# Patient Record
Sex: Female | Born: 1935 | Race: White | Hispanic: No | Smoking: Former smoker
Health system: Southern US, Community
[De-identification: ages and names within clinical notes are randomized; demographics above are authoritative.]

## PROBLEM LIST (undated history)

## (undated) DIAGNOSIS — J439 Emphysema, unspecified: Secondary | ICD-10-CM

## (undated) DIAGNOSIS — F0280 Dementia in other diseases classified elsewhere without behavioral disturbance: Secondary | ICD-10-CM

## (undated) DIAGNOSIS — R430 Anosmia: Secondary | ICD-10-CM

## (undated) DIAGNOSIS — B029 Zoster without complications: Secondary | ICD-10-CM

## (undated) DIAGNOSIS — I10 Essential (primary) hypertension: Secondary | ICD-10-CM

## (undated) DIAGNOSIS — N390 Urinary tract infection, site not specified: Secondary | ICD-10-CM

## (undated) DIAGNOSIS — J45909 Unspecified asthma, uncomplicated: Secondary | ICD-10-CM

## (undated) DIAGNOSIS — T7840XA Allergy, unspecified, initial encounter: Secondary | ICD-10-CM

## (undated) DIAGNOSIS — R55 Syncope and collapse: Secondary | ICD-10-CM

## (undated) DIAGNOSIS — H9190 Unspecified hearing loss, unspecified ear: Secondary | ICD-10-CM

## (undated) DIAGNOSIS — B019 Varicella without complication: Secondary | ICD-10-CM

## (undated) DIAGNOSIS — J339 Nasal polyp, unspecified: Secondary | ICD-10-CM

## (undated) DIAGNOSIS — G309 Alzheimer's disease, unspecified: Secondary | ICD-10-CM

## (undated) DIAGNOSIS — J449 Chronic obstructive pulmonary disease, unspecified: Secondary | ICD-10-CM

## (undated) HISTORY — DX: Nasal polyp, unspecified: J33.9

## (undated) HISTORY — DX: Unspecified hearing loss, unspecified ear: H91.90

## (undated) HISTORY — DX: Emphysema, unspecified: J43.9

## (undated) HISTORY — DX: Urinary tract infection, site not specified: N39.0

## (undated) HISTORY — DX: Essential (primary) hypertension: I10

## (undated) HISTORY — DX: Allergy, unspecified, initial encounter: T78.40XA

## (undated) HISTORY — DX: Anosmia: R43.0

## (undated) HISTORY — PX: OTHER SURGICAL HISTORY: SHX169

## (undated) HISTORY — PX: BREAST BIOPSY: SHX20

## (undated) HISTORY — PX: ABDOMINAL HYSTERECTOMY: SHX81

## (undated) HISTORY — DX: Varicella without complication: B01.9

## (undated) HISTORY — DX: Syncope and collapse: R55

## (undated) HISTORY — DX: Zoster without complications: B02.9

## (undated) HISTORY — DX: Unspecified asthma, uncomplicated: J45.909

---

## 2016-07-18 ENCOUNTER — Emergency Department: Payer: Medicare Other

## 2016-07-18 ENCOUNTER — Emergency Department
Admission: EM | Admit: 2016-07-18 | Discharge: 2016-07-19 | Disposition: A | Discharge status: Home / Self Care | Payer: Medicare Other | Attending: Emergency Medicine | Admitting: Emergency Medicine

## 2016-07-18 ENCOUNTER — Encounter: Payer: Self-pay | Admitting: Emergency Medicine

## 2016-07-18 DIAGNOSIS — L0390 Cellulitis, unspecified: Secondary | ICD-10-CM

## 2016-07-18 DIAGNOSIS — I8001 Phlebitis and thrombophlebitis of superficial vessels of right lower extremity: Secondary | ICD-10-CM | POA: Insufficient documentation

## 2016-07-18 DIAGNOSIS — Z87891 Personal history of nicotine dependence: Secondary | ICD-10-CM | POA: Diagnosis not present

## 2016-07-18 DIAGNOSIS — I809 Phlebitis and thrombophlebitis of unspecified site: Secondary | ICD-10-CM

## 2016-07-18 DIAGNOSIS — L03115 Cellulitis of right lower limb: Secondary | ICD-10-CM | POA: Insufficient documentation

## 2016-07-18 DIAGNOSIS — Z79899 Other long term (current) drug therapy: Secondary | ICD-10-CM | POA: Insufficient documentation

## 2016-07-18 DIAGNOSIS — M79604 Pain in right leg: Secondary | ICD-10-CM

## 2016-07-18 DIAGNOSIS — I803 Phlebitis and thrombophlebitis of lower extremities, unspecified: Secondary | ICD-10-CM | POA: Diagnosis present

## 2016-07-18 DIAGNOSIS — J449 Chronic obstructive pulmonary disease, unspecified: Secondary | ICD-10-CM | POA: Diagnosis not present

## 2016-07-18 DIAGNOSIS — G309 Alzheimer's disease, unspecified: Secondary | ICD-10-CM | POA: Insufficient documentation

## 2016-07-18 HISTORY — DX: Dementia in other diseases classified elsewhere, unspecified severity, without behavioral disturbance, psychotic disturbance, mood disturbance, and anxiety: F02.80

## 2016-07-18 HISTORY — DX: Chronic obstructive pulmonary disease, unspecified: J44.9

## 2016-07-18 HISTORY — DX: Alzheimer's disease, unspecified: G30.9

## 2016-07-18 LAB — CBC WITH DIFFERENTIAL/PLATELET
BASOS ABS: 0.1 10*3/uL (ref 0–0.1)
Basophils Relative: 1 %
EOS ABS: 0.5 10*3/uL (ref 0–0.7)
EOS PCT: 6 %
HCT: 37.3 % (ref 35.0–47.0)
Hemoglobin: 12.9 g/dL (ref 12.0–16.0)
LYMPHS PCT: 25 %
Lymphs Abs: 2.1 10*3/uL (ref 1.0–3.6)
MCH: 32.1 pg (ref 26.0–34.0)
MCHC: 34.5 g/dL (ref 32.0–36.0)
MCV: 93 fL (ref 80.0–100.0)
MONO ABS: 0.7 10*3/uL (ref 0.2–0.9)
Monocytes Relative: 8 %
Neutro Abs: 5 10*3/uL (ref 1.4–6.5)
Neutrophils Relative %: 60 %
PLATELETS: 212 10*3/uL (ref 150–440)
RBC: 4 MIL/uL (ref 3.80–5.20)
RDW: 13.8 % (ref 11.5–14.5)
WBC: 8.3 10*3/uL (ref 3.6–11.0)

## 2016-07-18 LAB — CBC
HCT: 37.3 % (ref 35.0–47.0)
Hemoglobin: 12.9 g/dL (ref 12.0–16.0)
MCH: 32.4 pg (ref 26.0–34.0)
MCHC: 34.7 g/dL (ref 32.0–36.0)
MCV: 93.4 fL (ref 80.0–100.0)
PLATELETS: 207 10*3/uL (ref 150–440)
RBC: 3.99 MIL/uL (ref 3.80–5.20)
RDW: 13.8 % (ref 11.5–14.5)
WBC: 8.3 10*3/uL (ref 3.6–11.0)

## 2016-07-18 LAB — BASIC METABOLIC PANEL
Anion gap: 5 (ref 5–15)
BUN: 32 mg/dL — AB (ref 6–20)
CALCIUM: 9.2 mg/dL (ref 8.9–10.3)
CO2: 28 mmol/L (ref 22–32)
CREATININE: 1.16 mg/dL — AB (ref 0.44–1.00)
Chloride: 107 mmol/L (ref 101–111)
GFR calc Af Amer: 46 mL/min — ABNORMAL LOW (ref >60)
GFR, EST NON AFRICAN AMERICAN: 40 mL/min — AB (ref >60)
GLUCOSE: 76 mg/dL (ref 65–99)
POTASSIUM: 4.1 mmol/L (ref 3.5–5.1)
SODIUM: 140 mmol/L (ref 135–145)

## 2016-07-18 NOTE — ED Triage Notes (Addendum)
Pt ambulatory to triage with c/o RIGHT leg pain, pt daughter reports mother mentioned right leg pain today. Redness noted to right calf, tender and warm to touch. Pt denies chest pain, abdominal pain, or shortness of breath. Pt has hx of alzheimer's.

## 2016-07-18 NOTE — ED Provider Notes (Addendum)
[facility] Emergency Department Provider Note   ____________________________________________   First MD Initiated Contact with Patient 07/18/16 2134     (approximate)  I have reviewed the triage vital signs and the nursing notes.   HISTORY  Chief Complaint Cellulitis   HPI Makayla [name] is a 82 y.o. female who came up from [place] in the [place] area to avoid [name] who now complains of pain in the right upper mid medial calf and going up above the knee slightly. The area is red there is a cord palpable.   Past Medical History:  Diagnosis Date  . Alzheimer's dementia   . COPD (chronic obstructive pulmonary disease) (HCC)     There are no active problems to display for this patient.   Past Surgical History:  Procedure Laterality Date  . ABDOMINAL HYSTERECTOMY    . nasal polyps removed      Prior to Admission medications   Medication Sig Start Date End Date Taking? Authorizing Provider  cetirizine (ZYRTEC) 10 MG tablet Take 10 mg by mouth daily.   Yes Historical Provider, MD  donepezil (ARICEPT) 10 MG tablet Take 10 mg by mouth at bedtime.   Yes Historical Provider, MD  Fluticasone-Salmeterol (ADVAIR) 250-50 MCG/DOSE AEPB Inhale 1 puff into the lungs 2 (two) times daily.   Yes Historical Provider, MD  losartan-hydrochlorothiazide (HYZAAR) 50-12.5 MG tablet Take 1 tablet by mouth daily.   Yes Historical Provider, MD  memantine (NAMENDA) 10 MG tablet Take 10 mg by mouth 2 (two) times daily.   Yes Historical Provider, MD  Multiple Vitamin (MULTIVITAMIN WITH MINERALS) TABS tablet Take 1 tablet by mouth daily.   Yes Historical Provider, MD    Allergies Review of patient's allergies indicates no known allergies.  No family history on file.  Social History Social History  Substance Use Topics  . Smoking status: Former Games developermoker  . Smokeless tobacco: Never Used  . Alcohol use No    Review of Systems Constitutional: No  fever/chills Eyes: No visual changes. ENT: No sore throat. Cardiovascular: Denies chest pain. Respiratory: Denies shortness of breath. Gastrointestinal: No abdominal pain.  No nausea, no vomiting.  No diarrhea.  No constipation. Genitourinary: Negative for dysuria. Musculoskeletal: Negative for back pain. Skin: Negative for rash. Neurological: Negative for headaches, focal weakness or  10-point ROS otherwise negative.  ____________________________________________   PHYSICAL EXAM:  VITAL SIGNS: ED Triage Vitals [07/18/16 2035]  Enc Vitals Group     BP (!) 152/67     Pulse Rate 62     Resp 18     Temp 98 F (36.7 C)     Temp Source Oral     SpO2 95 %     Weight 150 lb (68 kg)     Height 5\' 3"  (1.6 m)     Head Circumference      Peak Flow      Pain Score      Pain Loc      Pain Edu?      Excl. in GC?    Constitutional: Alert and oriented. Well appearing and in no acute distress. Eyes: Conjunctivae are normal. PERRL. EOMI. Head: Atraumatic. Nose: No congestion/rhinnorhea. Mouth/Throat: Mucous membranes are moist.  Oropharynx non-erythematous. Neck: No stridor. Cardiovascular: Normal rate, regular rhythm. Grossly normal heart sounds.  Good peripheral circulation. Respiratory: Normal respiratory effort.  No retractions. Lungs CTAB. Gastrointestinal: Soft and nontender. No distention. No abdominal bruits. No CVA tenderness. Musculoskeletal: Area of erythema approximately 2 inches  wide and approximately 8 inches long from below the right knee to just above it. There is a cord palpable there. Neurologic:  Normal speech and language. No gross focal neurologic deficits are appreciated. No gait instability. Skin:  Skin is warm, dry and intact. No rash noted. Psychiatric: Mood and affect are normal. Speech and behavior are normal.  ____________________________________________   LABS (all labs ordered are listed, but only abnormal results are displayed)  Labs Reviewed  BASIC  METABOLIC PANEL - Abnormal; Notable for the following:       Result Value   BUN 32 (*)    Creatinine, Ser 1.16 (*)    GFR calc non Af Amer 40 (*)    GFR calc Af Amer 46 (*)    All other components within normal limits  CBC  CBC WITH DIFFERENTIAL/PLATELET   ____________________________________________  EKG  ____________________________________________  RADIOLOGY  EXAM: Right LOWER EXTREMITY VENOUS DOPPLER ULTRASOUND  TECHNIQUE: Gray-scale sonography with graded compression, as well as color Doppler and duplex ultrasound were performed to evaluate the lower extremity deep venous systems from the level of the common femoral vein and including the common femoral, femoral, profunda femoral, popliteal and calf veins including the posterior tibial, peroneal and gastrocnemius veins when visible. The superficial great saphenous vein was also interrogated. Spectral Doppler was utilized to evaluate flow at rest and with distal augmentation maneuvers in the common femoral, femoral and popliteal veins.  COMPARISON:  None.  FINDINGS: Contralateral Common Femoral Vein: Respiratory phasicity is normal and symmetric with the symptomatic side. No evidence of thrombus. Normal compressibility.  Common Femoral Vein: No evidence of thrombus. Normal compressibility, respiratory phasicity and response to augmentation.  Saphenofemoral Junction: No evidence of thrombus. Normal compressibility and flow on color Doppler imaging.  Profunda Femoral Vein: No evidence of thrombus. Normal compressibility and flow on color Doppler imaging.  Femoral Vein: No evidence of thrombus. Normal compressibility, respiratory phasicity and response to augmentation.  Popliteal Vein: No evidence of thrombus. Normal compressibility, respiratory phasicity and response to augmentation.  Calf Veins: There is thrombus within a superficial vein of the right posterior calf, corresponding to the area of pain  indicated by the patient.  Superficial Great Saphenous Vein: No evidence of thrombus. Normal compressibility and flow on color Doppler imaging.  Venous Reflux:  None.  Other Findings:  None.  IMPRESSION: 1. No evidence of deep venous thrombosis. 2. Superficial calf vein thrombosis, corresponding to the area of pain.   Electronically Signed   By: Ellery Plunk M.D.   On: 07/19/2016 00:08 ____________________________________________   PROCEDURES  Procedure(s) performed:  Procedures  Critical Care performed:  ____________________________________________   INITIAL IMPRESSION / ASSESSMENT AND PLAN / ED COURSE  Pertinent labs & imaging results that were available during my care of the patient were reviewed by me and considered in my medical decision making (see chart for details).  Patient is still waiting to get ultrasound. Anticipate probably admitting her. Dr. Manson Passey will check on the ultrasound  Clinical Course     ____________________________________________   FINAL CLINICAL IMPRESSION(S) / ED DIAGNOSES  Final diagnoses:  Pain of right lower extremity  Superficial thrombophlebitis  Cellulitis, unspecified cellulitis site, unspecified extremity site, unspecified laterality      NEW MEDICATIONS STARTED DURING THIS VISIT:  New Prescriptions   No medications on file     Note:  This document was prepared using Dragon voice recognition software and may include unintentional dictation errors.    Arnaldo Natal, MD 07/18/16 [phone]  Arnaldo Natal, MD 07/19/16 [phone]

## 2016-07-18 NOTE — ED Notes (Signed)
Called lab to add on CBC with diff to blood sent down earlier, they stated they would run the CBC with diff.

## 2016-07-18 NOTE — ED Notes (Signed)
Pt states R posterior calf pain that started to bother her around 6pm. Upper R posterior calf is red, appears swollen, states tender to touch. Does not feel much warmer than other leg at this time. Pt ambulated to treatment room. Denies hx of DVT. Denies itching, states it is sore.

## 2016-07-19 DIAGNOSIS — I803 Phlebitis and thrombophlebitis of lower extremities, unspecified: Secondary | ICD-10-CM | POA: Diagnosis present

## 2016-07-19 MED ORDER — CEPHALEXIN 500 MG PO CAPS: 500.0000 mg | ORAL_CAPSULE | Freq: Two times a day (BID) | ORAL | 0 refills | Status: AC

## 2016-07-19 MED ORDER — IBUPROFEN 800 MG PO TABS: 400.0000 mg | ORAL_TABLET | Freq: Three times a day (TID) | ORAL | 0 refills | Status: DC | PRN

## 2016-07-19 NOTE — Consult Note (Signed)
[facility] [place] [facility]   PATIENT NAME: Makayla Brownernna Jay    MR#:  [id number]  DATE OF BIRTH:  1923-07-07  DATE OF ADMISSION:  07/18/2016  PRIMARY CARE PHYSICIAN: No PCP Per Patient   REQUESTING/REFERRING PHYSICIAN: Darnelle CatalanMalinda, MD  CHIEF COMPLAINT:   Chief Complaint  Patient presents with  . Cellulitis    HISTORY OF PRESENT ILLNESS:  Makayla [name]  is a 82 y.o. female who presents with Right lower extremity erythema and tenderness. She states that she just noticed it in last 24 hours. She is here from [place] visiting her daughter. She denies any fever or chills, or other infectious symptoms. Here in the ED her workup was completely benign, including labs all within normal limits, except for her Doppler of her lower extremity which showed superficial calf vein clot. Hospitals were called for consultation for evaluation to see the patient required admission for observation.  PAST MEDICAL HISTORY:   Past Medical History:  Diagnosis Date  . Alzheimer's dementia   . COPD (chronic obstructive pulmonary disease) (HCC)     PAST SURGICAL HISTOIRY:   Past Surgical History:  Procedure Laterality Date  . ABDOMINAL HYSTERECTOMY    . nasal polyps removed      SOCIAL HISTORY:   Social History  Substance Use Topics  . Smoking status: Former Games developermoker  . Smokeless tobacco: Never Used  . Alcohol use No    FAMILY HISTORY:  No family history on file.  DRUG ALLERGIES:  No Known Allergies  REVIEW OF SYSTEMS:  Review of Systems  Constitutional: Negative for chills, fever, malaise/fatigue and weight loss.  HENT: Negative for ear pain, hearing loss and tinnitus.   Eyes: Negative for blurred vision, double vision, pain and redness.  Respiratory: Negative for cough, hemoptysis and shortness of breath.   Cardiovascular: Negative for chest pain, palpitations, orthopnea and leg swelling.  Gastrointestinal: Negative for abdominal pain, constipation,  diarrhea, nausea and vomiting.  Genitourinary: Negative for dysuria, frequency and hematuria.  Musculoskeletal: Negative for back pain, joint pain and neck pain.  Skin:       Linear erythema and tenderness right lower extremity  Neurological: Negative for dizziness, tremors, focal weakness and weakness.  Endo/Heme/Allergies: Negative for polydipsia. Does not bruise/bleed easily.  Psychiatric/Behavioral: Negative for depression. The patient is not nervous/anxious and does not have insomnia.     MEDICATIONS AT HOME:   Prior to Admission medications   Medication Sig Start Date End Date Taking? Authorizing Provider  cetirizine (ZYRTEC) 10 MG tablet Take 10 mg by mouth daily.   Yes Historical Provider, MD  donepezil (ARICEPT) 10 MG tablet Take 10 mg by mouth at bedtime.   Yes Historical Provider, MD  Fluticasone-Salmeterol (ADVAIR) 250-50 MCG/DOSE AEPB Inhale 1 puff into the lungs 2 (two) times daily.   Yes Historical Provider, MD  losartan-hydrochlorothiazide (HYZAAR) 50-12.5 MG tablet Take 1 tablet by mouth daily.   Yes Historical Provider, MD  memantine (NAMENDA) 10 MG tablet Take 10 mg by mouth 2 (two) times daily.   Yes Historical Provider, MD  Multiple Vitamin (MULTIVITAMIN WITH MINERALS) TABS tablet Take 1 tablet by mouth daily.   Yes Historical Provider, MD  cephALEXin (KEFLEX) 500 MG capsule Take 1 capsule (500 mg total) by mouth 2 (two) times daily. 07/19/16 07/29/16  Darci Currentandolph N Brown, MD  ibuprofen (ADVIL,MOTRIN) 800 MG tablet Take 0.5 tablets (400 mg total) by mouth every 8 (eight) hours as needed. 07/19/16   Darci Currentandolph N Brown, MD  VITAL SIGNS:   Vitals:   07/18/16 2035 07/18/16 2133 07/18/16 2234  BP: (!) 152/67 (!) 147/73 135/78  Pulse: 62 65 61  Resp: 18 18 16   Temp: 98 F (36.7 C)    TempSrc: Oral    SpO2: 95% 97% 95%  Weight: 68 kg (150 lb)    Height: 5\' 3"  (1.6 m)     Wt Readings from Last 3 Encounters:  07/18/16 68 kg (150 lb)    PHYSICAL EXAMINATION:   Physical Exam  Vitals reviewed. Constitutional: She is oriented to person, place, and time. She appears well-developed and well-nourished. No distress.  HENT:  Head: Normocephalic and atraumatic.  Mouth/Throat: Oropharynx is clear and moist.  Eyes: Conjunctivae and EOM are normal. Pupils are equal, round, and reactive to light. No scleral icterus.  Neck: Normal range of motion. Neck supple. No JVD present. No thyromegaly present.  Cardiovascular: Normal rate, regular rhythm and intact distal pulses.  Exam reveals no gallop and no friction rub.   No murmur heard. Respiratory: Effort normal and breath sounds normal. No respiratory distress. She has no wheezes. She has no rales.  GI: Soft. Bowel sounds are normal. She exhibits no distension. There is no tenderness.  Musculoskeletal: Normal range of motion. She exhibits no edema.  No arthritis, no gout  Lymphadenopathy:    She has no cervical adenopathy.  Neurological: She is alert and oriented to person, place, and time. No cranial nerve deficit.  No dysarthria, no aphasia  Skin: Skin is warm and dry. No rash noted. There is erythema (with tenderness and cording in a linear fashion extending from her proximal calf posteriorly to just above her knee joint).  Psychiatric: She has a normal mood and affect. Her behavior is normal. Judgment and thought content normal.     LABORATORY PANEL:   CBC  Recent Labs Lab 07/18/16 2048  WBC 8.3  8.3  HGB 12.9  12.9  HCT 37.3  37.3  PLT 212  207   ------------------------------------------------------------------------------------------------------------------  Chemistries   Recent Labs Lab 07/18/16 2048  NA 140  K 4.1  CL 107  CO2 28  GLUCOSE 76  BUN 32*  CREATININE 1.16*  CALCIUM 9.2   ------------------------------------------------------------------------------------------------------------------  Cardiac Enzymes No results for input(s): TROPONINI in the last 168  hours. ------------------------------------------------------------------------------------------------------------------  RADIOLOGY:  US Venous Img Lower Unilateral Right  Result Date: 07/19/2016 CLINICAL DATA:  Right lower extremity swelling in area EXAM: Right LOWER EXTREMITY VENOUS DOPPLER ULTRASOUND TECHNIQUE: Gray-scale sonography with graded compression, as well as color Doppler and duplex ultrasound were performed to evaluate the lower extremity deep venous systems from the level of the common femoral vein and including the common femoral, femoral, profunda femoral, popliteal and calf veins including the posterior tibial, peroneal and gastrocnemius veins when visible. The superficial great saphenous vein was also interrogated. Spectral Doppler was utilized to evaluate flow at rest and with distal augmentation maneuvers in the common femoral, femoral and popliteal veins. COMPARISON:  None. FINDINGS: Contralateral Common Femoral Vein: Respiratory phasicity is normal and symmetric with the symptomatic side. No evidence of thrombus. Normal compressibility. Common Femoral Vein: No evidence of thrombus. Normal compressibility, respiratory phasicity and response to augmentation. Saphenofemoral Junction: No evidence of thrombus. Normal compressibility and flow on color Doppler imaging. Profunda Femoral Vein: No evidence of thrombus. Normal compressibility and flow on color Doppler imaging. Femoral Vein: No evidence of thrombus. Normal compressibility, respiratory phasicity and response to augmentation. Popliteal Vein: No evidence of thrombus. Normal compressibility, respiratory phasicity  and response to augmentation. Calf Veins: There is thrombus within a superficial vein of the right posterior calf, corresponding to the area of pain indicated by the patient. Superficial Great Saphenous Vein: No evidence of thrombus. Normal compressibility and flow on color Doppler imaging. Venous Reflux:  None. Other  Findings:  None. IMPRESSION: 1. No evidence of deep venous thrombosis. 2. Superficial calf vein thrombosis, corresponding to the area of pain. Electronically Signed   By: Ellery Plunk M.D.   On: 07/19/2016 00:08    EKG:  No orders found for this or any previous visit.  IMPRESSION AND PLAN:  Principal Problem:   Thrombophlebitis leg - Patient has uncomplicated superficial thrombophlebitis. She does not require admission at this time. Doppler of the lower extremity showed clot in the posterior superficial calf vein, consistent with clinical exam. Differential includes cellulitis, though the latter is much less likely. Recommend NSAIDs, warm compresses, elevation of the limb, and a seven-day course of by mouth Keflex.  All the records are reviewed and case discussed with ED provider. Management plans discussed with the patient and/or family.    TOTAL TIME TAKING CARE OF THIS PATIENT: 35 minutes.    Makayla [name] 07/19/2016, 1:34 AM  [facility]  Office  [phone]  CC: Primary care Physician: No PCP Per Patient

## 2017-10-08 ENCOUNTER — Encounter: Payer: Self-pay | Admitting: Internal Medicine

## 2017-10-08 ENCOUNTER — Ambulatory Visit: Payer: Medicare Other | Admitting: Internal Medicine

## 2017-10-08 VITALS — BP 138/68 | HR 58 | Temp 98.2°F | Resp 16 | Ht 61.75 in | Wt 154.2 lb

## 2017-10-08 DIAGNOSIS — M1990 Unspecified osteoarthritis, unspecified site: Secondary | ICD-10-CM

## 2017-10-08 DIAGNOSIS — Z1329 Encounter for screening for other suspected endocrine disorder: Secondary | ICD-10-CM

## 2017-10-08 DIAGNOSIS — I10 Essential (primary) hypertension: Secondary | ICD-10-CM | POA: Insufficient documentation

## 2017-10-08 DIAGNOSIS — I6529 Occlusion and stenosis of unspecified carotid artery: Secondary | ICD-10-CM

## 2017-10-08 DIAGNOSIS — E559 Vitamin D deficiency, unspecified: Secondary | ICD-10-CM | POA: Diagnosis not present

## 2017-10-08 DIAGNOSIS — I5189 Other ill-defined heart diseases: Secondary | ICD-10-CM

## 2017-10-08 DIAGNOSIS — I358 Other nonrheumatic aortic valve disorders: Secondary | ICD-10-CM

## 2017-10-08 DIAGNOSIS — J449 Chronic obstructive pulmonary disease, unspecified: Secondary | ICD-10-CM

## 2017-10-08 DIAGNOSIS — N183 Chronic kidney disease, stage 3 unspecified: Secondary | ICD-10-CM

## 2017-10-08 DIAGNOSIS — I519 Heart disease, unspecified: Secondary | ICD-10-CM

## 2017-10-08 DIAGNOSIS — F0390 Unspecified dementia without behavioral disturbance: Secondary | ICD-10-CM | POA: Diagnosis not present

## 2017-10-08 DIAGNOSIS — F0391 Unspecified dementia with behavioral disturbance: Secondary | ICD-10-CM | POA: Insufficient documentation

## 2017-10-08 DIAGNOSIS — F03918 Unspecified dementia, unspecified severity, with other behavioral disturbance: Secondary | ICD-10-CM | POA: Insufficient documentation

## 2017-10-08 DIAGNOSIS — R011 Cardiac murmur, unspecified: Secondary | ICD-10-CM | POA: Insufficient documentation

## 2017-10-08 DIAGNOSIS — J4520 Mild intermittent asthma, uncomplicated: Secondary | ICD-10-CM | POA: Diagnosis not present

## 2017-10-08 DIAGNOSIS — E785 Hyperlipidemia, unspecified: Secondary | ICD-10-CM

## 2017-10-08 LAB — URINALYSIS, ROUTINE W REFLEX MICROSCOPIC
Bilirubin Urine: NEGATIVE
Hgb urine dipstick: NEGATIVE
Ketones, ur: NEGATIVE
Leukocytes, UA: NEGATIVE
Nitrite: POSITIVE — AB
PH: 5.5 (ref 5.0–8.0)
RBC / HPF: NONE SEEN (ref 0–?)
SPECIFIC GRAVITY, URINE: 1.015 (ref 1.000–1.030)
Total Protein, Urine: NEGATIVE
Urine Glucose: NEGATIVE
Urobilinogen, UA: 0.2 (ref 0.0–1.0)

## 2017-10-08 LAB — VITAMIN D 25 HYDROXY (VIT D DEFICIENCY, FRACTURES): VITD: 20.14 ng/mL — AB (ref 30.00–100.00)

## 2017-10-08 LAB — CBC
HCT: 41.7 % (ref 36.0–46.0)
HEMOGLOBIN: 13.6 g/dL (ref 12.0–15.0)
MCHC: 32.7 g/dL (ref 30.0–36.0)
MCV: 94.8 fl (ref 78.0–100.0)
Platelets: 239 10*3/uL (ref 150.0–400.0)
RBC: 4.4 Mil/uL (ref 3.87–5.11)
RDW: 14.6 % (ref 11.5–15.5)
WBC: 7.8 10*3/uL (ref 4.0–10.5)

## 2017-10-08 LAB — TSH: TSH: 1.7 u[IU]/mL (ref 0.35–4.50)

## 2017-10-08 MED ORDER — CETIRIZINE HCL 10 MG PO TABS: 10.0000 mg | ORAL_TABLET | Freq: Every day | ORAL | 3 refills | Status: DC

## 2017-10-08 MED ORDER — DONEPEZIL HCL 10 MG PO TABS
10.0000 mg | ORAL_TABLET | Freq: Every day | ORAL | 1 refills | Status: DC
Start: 2017-10-08 — End: 2018-05-29

## 2017-10-08 MED ORDER — FLUTICASONE-SALMETEROL 250-50 MCG/DOSE IN AEPB: 1.0000 | INHALATION_SPRAY | Freq: Two times a day (BID) | RESPIRATORY_TRACT | 5 refills | Status: DC

## 2017-10-08 MED ORDER — LOSARTAN POTASSIUM 50 MG PO TABS: 50.0000 mg | ORAL_TABLET | Freq: Every day | ORAL | 1 refills | Status: DC

## 2017-10-08 NOTE — Patient Instructions (Addendum)
Follow up in 3-4 months sooner if needed  Think about Tdap and Shingrix vaccines.    Heart Murmur A heart murmur is an extra sound that is caused by chaotic blood flow. The murmur can be heard as a "hum" or "whoosh" sound when blood flows through the heart. The heart has four areas called chambers. Valves separate the upper and lower chambers from each other (tricuspid valve and mitral valve) and separate the lower chambers of the heart from pathways that lead away from the heart (aortic valve and pulmonary valve). Normally, the valves open to let blood flow through or out of your heart, and then they shut to keep the blood from flowing backward. There are two types of heart murmurs:  Innocent murmurs. Most people with this type of heart murmur do not have a heart problem. Many children have innocent heart murmurs. Your health care provider may suggest some basic testing to find out whether your murmur is an innocent murmur. If an innocent heart murmur is found, there is no need for further tests or treatment and no need to restrict activities or stop playing sports.  Abnormal murmurs. These types of murmurs can occur in children and adults. Abnormal murmurs may be a sign of a more serious heart condition, such as a heart defect present at birth (congenital defect) or heart valve disease.  What are the causes? This condition is caused by heart valves that are not working properly. In children, abnormal heart murmurs are typically caused by congenital defects. In adults, abnormal murmurs are usually from heart valve problems caused by disease, infection, or aging. Three types of heart valve defects can cause a murmur:  Regurgitation. This is when blood leaks back through the valve in the wrong direction.  Mitral valve prolapse. This is when the mitral valve of the heart has a loose flap and does not close tightly.  Stenosis. This is when a valve does not open enough and blocks blood flow.  This  condition may also be caused by:  Pregnancy.  Fever.  Overactive thyroid gland.  Anemia.  Exercise.  Rapid growth spurts (in children).  What are the signs or symptoms? Innocent murmurs do not cause symptoms, and many people with abnormal murmurs may or may not have symptoms. If symptoms do develop, they may include:  Shortness of breath.  Blue coloring of the skin, especially on the fingertips.  Chest pain.  Palpitations, or feeling a fluttering or skipped heartbeat.  Fainting.  Persistent cough.  Getting tired much faster than expected.  Swelling in the abdomen, feet, or ankles.  How is this diagnosed? This condition may be diagnosed during a routine physical or other exam. If your health care provider hears a murmur with a stethoscope, he or she will listen for:  Where the murmur is located in your heart.  How long the murmur lasts (duration).  When the murmur is heard during the heartbeat.  How loud the murmur is. This may help the health care provider figure out what is causing the murmur.  You may be referred to a heart specialist (cardiologist). You may also have other tests, including:  Electrocardiogram (ECG or EKG). This test measures the electrical activity of your heart.  Echocardiogram. This test uses high frequency sound waves to make pictures of your heart.  MRI or chest X-ray.  Cardiac catheterization. This test looks at blood flow through the heart.  For children and adults who have an abnormal heart murmur and want to  stay active, it is important to complete testing, review test results, and receive recommendations from your health care provider. If heart disease is present, it may not be safe to play or be active. How is this treated? Heart murmurs themselves do not need treatment. In some cases, a heart murmur may go away on its own. If an underlying problem or disease is causing the murmur, you may need treatment. If treatment is needed, it  will depend on the type and severity of the disease or heart problem causing the murmur. Treatment may include:  Medicine.  Surgery.  Dietary and lifestyle changes.  Follow these instructions at home:  Talk with your health care provider before participating in sports or other activities that require a lot of effort and energy (are strenuous).  Learn as much as possible about your condition and any related diseases. Ask your health care provider if you may at risk for any medical emergencies.  Talk with your health care provider about what symptoms you should look out for.  It is up to you to get your test results. Ask your health care provider, or the department that is doing the test, when your results will be ready.  Keep all follow-up visits as told by your health care provider. This is important. Contact a health care provider if:  You feel light-headed.  You are frequently short of breath.  You feel more tired than usual.  You are having a hard time keeping up with normal activities or fitness routines.  You have swelling in your ankles or feet.  You have chest pain.  You notice that your heart often beats irregularly.  You develop any new symptoms. Get help right away if:  You develop severe chest pain.  You are having trouble breathing.  You have fainting spells.  Your symptoms suddenly get worse. These symptoms may represent a serious problem that is an emergency. Do not wait to see if the symptoms will go away. Get medical help right away. Call your local emergency services (911 in the [place] Do not drive yourself to the hospital. Summary  Normally, the heart valves open to let blood flow through or out of your heart, and then they shut to keep the blood from flowing backward.  Heart murmur is caused by heart valves that are not working properly.  You may need treatment if an underlying problem or disease is causing the heart murmur. Treatment may include  medicine, surgery, or dietary and lifestyle changes.  Talk with your health care provider before participating in sports or other activities that require a lot of effort and energy (are strenuous).  Talk with your health care provider about what symptoms you should watch out for. This information is not intended to replace advice given to you by your health care provider. Make sure you discuss any questions you have with your health care provider. Document Released: 11/30/2004 Document Revised: 10/11/2016 Document Reviewed: 10/11/2016 Elsevier Interactive Patient Education  2017 Else[employer].  Hypertension Hypertension is another name for high blood pressure. High blood pressure forces your heart to work harder to pump blood. This can cause problems over time. There are two numbers in a blood pressure reading. There is a top number (systolic) over a bottom number (diastolic). It is best to have a blood pressure below 120/80. Healthy choices can help lower your blood pressure. You may need medicine to help lower your blood pressure if:  Your blood pressure cannot be lowered with healthy  choices.  Your blood pressure is higher than 130/80.  Follow these instructions at home: Eating and drinking  If directed, follow the DASH eating plan. This diet includes: ? Filling half of your plate at each meal with fruits and vegetables. ? Filling one quarter of your plate at each meal with whole grains. Whole grains include whole wheat pasta, brown rice, and whole grain bread. ? Eating or drinking low-fat dairy products, such as skim milk or low-fat yogurt. ? Filling one quarter of your plate at each meal with low-fat (lean) proteins. Low-fat proteins include fish, skinless chicken, eggs, beans, and tofu. ? Avoiding fatty meat, cured and processed meat, or chicken with skin. ? Avoiding premade or processed food.  Eat less than 1,500 mg of salt (sodium) a day.  Limit alcohol use to no more than 1  drink a day for nonpregnant women and 2 drinks a day for men. One drink equals 12 oz of beer, 5 oz of wine, or 1 oz of hard liquor. Lifestyle  Work with your doctor to stay at a healthy weight or to lose weight. Ask your doctor what the best weight is for you.  Get at least 30 minutes of exercise that causes your heart to beat faster (aerobic exercise) most days of the week. This may include walking, swimming, or biking.  Get at least 30 minutes of exercise that strengthens your muscles (resistance exercise) at least 3 days a week. This may include lifting weights or pilates.  Do not use any products that contain nicotine or tobacco. This includes cigarettes and e-cigarettes. If you need help quitting, ask your doctor.  Check your blood pressure at home as told by your doctor.  Keep all follow-up visits as told by your doctor. This is important. Medicines  Take over-the-counter and prescription medicines only as told by your doctor. Follow directions carefully.  Do not skip doses of blood pressure medicine. The medicine does not work as well if you skip doses. Skipping doses also puts you at risk for problems.  Ask your doctor about side effects or reactions to medicines that you should watch for. Contact a doctor if:  You think you are having a reaction to the medicine you are taking.  You have headaches that keep coming back (recurring).  You feel dizzy.  You have swelling in your ankles.  You have trouble with your vision. Get help right away if:  You get a very bad headache.  You start to feel confused.  You feel weak or numb.  You feel faint.  You get very bad pain in your: ? Chest. ? Belly (abdomen).  You throw up (vomit) more than once.  You have trouble breathing. Summary  Hypertension is another name for high blood pressure.  Making healthy choices can help lower blood pressure. If your blood pressure cannot be controlled with healthy choices, you may  need to take medicine. This information is not intended to replace advice given to you by your health care provider. Make sure you discuss any questions you have with your health care provider. Document Released: 04/10/2008 Document Revised: 09/20/2016 Document Reviewed: 09/20/2016 Elsevier Interactive Patient Education  [employer].

## 2017-10-08 NOTE — Progress Notes (Addendum)
Chief Complaint  Patient presents with  . Establish Care   Establish care w/ daughter who contributes to history as pt has memory loss. Daughter reports she has CNA and another caregiver at home while daughter works and pt declines to go to PACE Patient denies complaints today and needs to establish PCP as just moved to area.  1. Asthma/emphysema controlled Advair 250/50 2. HTN controlled on losartan 50 mg qd previously HCTZ 12.5 mgqd was d/c'ed due to syncope   Review of Systems  Constitutional: Negative for weight loss.  HENT: Positive for hearing loss and nosebleeds.   Eyes:       +trouble with vision and looks dimmer does not wear glasses wears readers which help also with light   Respiratory: Positive for cough. Negative for shortness of breath.        +always had cough since daughter has know   Cardiovascular: Negative for chest pain.  Gastrointestinal: Positive for blood in stool.       +red stools intermittently per daughter   Skin: Negative for rash.  Neurological: Negative for loss of consciousness.  Endo/Heme/Allergies: Does not bruise/bleed easily.  Psychiatric/Behavioral: Positive for memory loss.   Past Medical History:  Diagnosis Date  . Alzheimer's dementia    CT head 08/25/15 mild cortical atrophy and chronic small vessel ischemic change. atheroscloerosis. paranasal sinus disease   . Anosmia   . COPD (chronic obstructive pulmonary disease) (HCC)   . Emphysema of lung (HCC)    per daughter never smoker exposed 2nd have via husband  . Hard of hearing    since childhood  . Hypertension   . Nasal polyps    surgery x 15 years ago and in ~2013 as of 10/08/17  . Syncope    03/2017 thought 2/2 [facility]    Past Surgical History:  Procedure Laterality Date  . ABDOMINAL HYSTERECTOMY    . nasal polyps removed     Family History  Problem Relation Age of Onset  . Cancer Mother        ? type  . Other Mother        scarlett fever, skin ulcer,  died of complications after MVA   . Alcohol abuse Paternal Uncle    Social History   Socioeconomic History  . Marital status: Widowed    Spouse name: Not on file  . Number of children: Not on file  . Years of education: Not on file  . Highest education level: Not on file  Social Needs  . Financial resource strain: Not on file  . Food insecurity - worry: Not on file  . Food insecurity - inability: Not on file  . Transportation needs - medical: Not on file  . Transportation needs - non-medical: Not on file  Occupational History  . Not on file  Tobacco Use  . Smoking status: Never Smoker  . Smokeless tobacco: Never Used  Substance and Sexual Activity  . Alcohol use: No  . Drug use: No  . Sexual activity: No  Other Topics Concern  . 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 aspirin EC 81 MG tablet Take 81 mg by mouth daily.  . cetirizine (ZYRTEC) 10 MG tablet Take 10  mg by mouth daily.  Marland Kitchen. donepezil (ARICEPT) 10 MG tablet Take 10 mg by mouth at bedtime.  . Fluticasone-Salmeterol (ADVAIR) 250-50 MCG/DOSE AEPB Inhale 1 puff into the lungs 2 (two) times daily.  Marland Kitchen. FLUZONE HIGH-DOSE 0.5 ML injection TO BE ADMINISTERED BY PHARMACIST FOR IMMUNIZATION  . ibuprofen (ADVIL,MOTRIN) 800 MG tablet Take 0.5 tablets (400 mg total) by mouth every 8 (eight) hours as needed.  Marland Kitchen. losartan-hydrochlorothiazide (HYZAAR) 50-12.5 MG tablet Take 1 tablet by mouth daily.  . memantine (NAMENDA) 10 MG tablet Take 10 mg by mouth 2 (two) times daily.  Marland Kitchen. PREVNAR 13 SUSP injection TO BE ADMINISTERED BY PHARMACIST FOR IMMUNIZATION   Allergies  Allergen Reactions  . Latex     rash   No results found for this or any previous visit (from the past 2160 hour(s)). Objective  Body mass index is 28.44 kg/m. Wt Readings from Last 3  Encounters:  10/08/17 154 lb 4 oz (70 kg)  07/18/16 150 lb (68 kg)   Temp Readings from Last 3 Encounters:  10/08/17 98.2 F (36.8 C) (Oral)  07/18/16 98 F (36.7 C) (Oral)   BP Readings from Last 3 Encounters:  10/08/17 138/68  07/19/16 126/62   Pulse Readings from Last 3 Encounters:  10/08/17 (!) 58  07/19/16 (!) 58   Pulse oximetry on room air is 92% Physical Exam  Constitutional: She is oriented to person, place, and time and well-developed, well-nourished, and in no distress.  HENT:  Head: Normocephalic and atraumatic.  Mouth/Throat: Oropharynx is clear and moist and mucous membranes are normal.  Eyes: Conjunctivae are normal. Pupils are equal, round, and reactive to light.  Cardiovascular: Regular rhythm.  Murmur heard. Pulmonary/Chest: Effort normal and breath sounds normal.  Abdominal: Soft. Bowel sounds are normal. There is no tenderness.  Neurological: She is alert and oriented to person, place, and time. Gait normal.  Skin: Skin is warm and dry.     Psychiatric: Mood, memory, affect and judgment normal.  Nursing note and vitals reviewed.  Assessment   1. Cardiac murmur  2. Memory loss/Dementia likely age related reviewed CT head 08/2015 mild cortical atrophy and chronic small vessel ischemic change. Atherosclerosis, paranasal sinus diseae  3. H/o asthma/emphysema controlled w/o h/o smoking per daughter  334. HM 5. H/o right superficial DVT noted 07/2016 pt daughter did hot compress, Abx and did not do anticoagulation  6. HTN controlled    Plan  1.  Get old records to review echo signed release today  2. Cont meds  Daughter declines neurology referral at this time and pt declines PACE services  Pt able to asst with dressing/bathing though does not cook/clean and has 2 caregivers at home while daughter at work  Daughter does not want dementia dx disc. With pt due to this will upset her   3. Cont meds  4. Had flu and prevnar 08/11/17.  Unknown h/o Tdap,  shingrix, pna 23 will get records  Possibly had zostavx 09/2014   Per daughter DEXA had in the past and was on Rx 09/02/14 DEXA reviewed +osteopenia ? If ever had colonoscopy, ? Last mammogram, s/p hysterectomy   Check CMET, CBC, UA, TSh, Vit D today, check lipid in the future   Get records Dr. Dirk DressGaston Perez [place], Dr. Theron AristaPeter Rippey [place], [facility]eth [facility]  (he may have copy of vaccines), [facility] [place]  -obtained records Dr. Clarisa KindredPerez prev on Metformin 500 mg bid h/o prediabetes , was prev hyzaar-hct 50/12.5 mg  qd, h/o uric acid elevation 7.4 05/12/15, h/o RF IgM + 14 12/30/14, hs-CRP 3.3, antiCCP slightly elevated in the past 25 nl <19   rec skin check tbse esp right lower leg in the future   At this time pt does not want to see ENT about hearing.   5. disc'ed may need repeat US RLE in future though pt does not have sx's now pending records to review as pt did f/u with PCP in [facility] after this dx  6. HCTZ 12.5 d/c after syncopal episode and just takes Losartan 50 mg qd    Provider: Dr. French Ana McLean-Scocuzza

## 2017-10-09 LAB — COMPREHENSIVE METABOLIC PANEL
ALK PHOS: 81 U/L (ref 39–117)
ALT: 13 U/L (ref 0–35)
AST: 15 U/L (ref 0–37)
Albumin: 4.2 g/dL (ref 3.5–5.2)
BILIRUBIN TOTAL: 0.6 mg/dL (ref 0.2–1.2)
BUN: 24 mg/dL — ABNORMAL HIGH (ref 6–23)
CO2: 27 mEq/L (ref 19–32)
Calcium: 9.7 mg/dL (ref 8.4–10.5)
Chloride: 105 mEq/L (ref 96–112)
Creatinine, Ser: 1.11 mg/dL (ref 0.40–1.20)
GFR: 48.79 mL/min — AB (ref >60.00)
GLUCOSE: 78 mg/dL (ref 70–99)
Potassium: 4.5 mEq/L (ref 3.5–5.1)
SODIUM: 142 meq/L (ref 135–145)
TOTAL PROTEIN: 6.6 g/dL (ref 6.0–8.3)

## 2017-10-10 ENCOUNTER — Other Ambulatory Visit: Payer: Self-pay | Admitting: Internal Medicine

## 2017-10-10 DIAGNOSIS — E559 Vitamin D deficiency, unspecified: Secondary | ICD-10-CM

## 2017-10-10 MED ORDER — VITAMIN D3 1.25 MG (50000 UT) PO CAPS: 1.0000 | ORAL_CAPSULE | ORAL | 1 refills | Status: DC

## 2017-10-11 ENCOUNTER — Telehealth: Payer: Self-pay | Admitting: Internal Medicine

## 2017-10-11 NOTE — Telephone Encounter (Signed)
Pt's daughter called for lab results; results given.  Per the result note by Dr. Vilma PraderMcLean-Scoucuzza, need to "ask if the pt. is having any symptoms of UTI."  Advised daughter of dirty urine, and questioned if she knows if pt. is symptomatic.  Daughter is unaware if pt. is having any UTI symptoms; stated she will check with her, and call back with that information.

## 2017-10-12 NOTE — Telephone Encounter (Signed)
Spoke with pt's daughter, [name] who states that her mother has not had any complaints of burning, frequency, odor or any other symptoms of UTI. Pt's daughter states that her mother has been acting normal, eating normally and has not had any behavioral changes. Unable to leave this information under result note, due to it not being available.

## 2017-10-12 NOTE — Telephone Encounter (Signed)
FYI

## 2017-11-07 ENCOUNTER — Telehealth: Payer: Self-pay

## 2017-11-07 NOTE — Telephone Encounter (Signed)
Left voice mail to call back 

## 2017-11-07 NOTE — Telephone Encounter (Signed)
Copied from CRM [id number]. Topic: Inquiry >> Nov 07, 2017  4:28 PM Everardo Pacific[place], [place], [place] wrote: Reason for CRM: Dewayne Hatchnn called because she has a few questions about this patient. (If you all needed a fax from them or if they needed a fax for you all). If someone could give her a call back at [phone]. If no answer please leave a vmail

## 2017-11-25 DIAGNOSIS — I358 Other nonrheumatic aortic valve disorders: Secondary | ICD-10-CM | POA: Insufficient documentation

## 2017-11-25 DIAGNOSIS — N183 Chronic kidney disease, stage 3 unspecified: Secondary | ICD-10-CM | POA: Insufficient documentation

## 2017-11-25 DIAGNOSIS — I5189 Other ill-defined heart diseases: Secondary | ICD-10-CM | POA: Insufficient documentation

## 2017-11-25 DIAGNOSIS — M1990 Unspecified osteoarthritis, unspecified site: Secondary | ICD-10-CM | POA: Insufficient documentation

## 2017-11-25 DIAGNOSIS — J449 Chronic obstructive pulmonary disease, unspecified: Secondary | ICD-10-CM | POA: Insufficient documentation

## 2017-11-25 DIAGNOSIS — E785 Hyperlipidemia, unspecified: Secondary | ICD-10-CM | POA: Insufficient documentation

## 2017-11-25 DIAGNOSIS — I6529 Occlusion and stenosis of unspecified carotid artery: Secondary | ICD-10-CM | POA: Insufficient documentation

## 2017-12-03 ENCOUNTER — Other Ambulatory Visit: Payer: Self-pay

## 2017-12-03 NOTE — Telephone Encounter (Signed)
They have never filled script before  Last office visit 10/08/17 Next 01/07/18

## 2017-12-04 MED ORDER — MEMANTINE HCL 10 MG PO TABS: 10.0000 mg | ORAL_TABLET | Freq: Two times a day (BID) | ORAL | 3 refills | Status: DC

## 2017-12-14 ENCOUNTER — Telehealth: Payer: Self-pay | Admitting: Internal Medicine

## 2017-12-14 NOTE — Telephone Encounter (Signed)
Please advise 

## 2017-12-14 NOTE — Telephone Encounter (Signed)
Pt daughter is requesting a referral for pt to neurology so that paperwork can be filled out on her mothers " Alzheimer's"

## 2017-12-15 ENCOUNTER — Other Ambulatory Visit: Payer: Self-pay | Admitting: Internal Medicine

## 2017-12-15 DIAGNOSIS — F0390 Unspecified dementia without behavioral disturbance: Secondary | ICD-10-CM

## 2018-01-07 ENCOUNTER — Encounter: Payer: Self-pay | Admitting: Internal Medicine

## 2018-01-07 ENCOUNTER — Ambulatory Visit: Payer: Medicare Other | Admitting: Internal Medicine

## 2018-01-07 VITALS — BP 142/60 | HR 69 | Temp 97.8°F | Ht 67.75 in | Wt 161.2 lb

## 2018-01-07 DIAGNOSIS — Z1283 Encounter for screening for malignant neoplasm of skin: Secondary | ICD-10-CM

## 2018-01-07 DIAGNOSIS — Z87898 Personal history of other specified conditions: Secondary | ICD-10-CM | POA: Diagnosis not present

## 2018-01-07 DIAGNOSIS — Z13220 Encounter for screening for lipoid disorders: Secondary | ICD-10-CM

## 2018-01-07 DIAGNOSIS — I10 Essential (primary) hypertension: Secondary | ICD-10-CM

## 2018-01-07 DIAGNOSIS — F0390 Unspecified dementia without behavioral disturbance: Secondary | ICD-10-CM

## 2018-01-07 DIAGNOSIS — J449 Chronic obstructive pulmonary disease, unspecified: Secondary | ICD-10-CM | POA: Diagnosis not present

## 2018-01-07 DIAGNOSIS — R269 Unspecified abnormalities of gait and mobility: Secondary | ICD-10-CM | POA: Diagnosis not present

## 2018-01-07 MED ORDER — ALBUTEROL SULFATE HFA 108 (90 BASE) MCG/ACT IN AERS: 1.0000 | INHALATION_SPRAY | Freq: Four times a day (QID) | RESPIRATORY_TRACT | 11 refills | Status: DC | PRN

## 2018-01-07 NOTE — Progress Notes (Signed)
Chief Complaint  Patient presents with  . Follow-up   F/u with daughter  1. Daughter reports pt was walking outside ~2 weeks ago and went from hard surface to soft surface with caretaker and feel w/o injury. Pt declines to walk with walking device 2. Cough and wheezing h/o COPD never smoker daughter reports noncompliance with Advair inhaler 3. Dementia daughter does not want to see neurology for [facility] for now  4. HTN at goal on current meds cozaar 50 mg qd   Review of Systems  Constitutional: Negative for weight loss.  HENT: Negative for hearing loss.   Eyes: Negative for blurred vision.  Respiratory: Positive for cough, shortness of breath and wheezing.   Cardiovascular: Negative for chest pain.  Gastrointestinal: Negative for abdominal pain.  Musculoskeletal: Positive for falls.  Skin: Negative for rash.  Neurological: Negative for headaches.  Endo/Heme/Allergies: Does not bruise/bleed easily.  Psychiatric/Behavioral: Positive for memory loss.   Past Medical History:  Diagnosis Date  . Allergy   . Alzheimer's dementia    CT head 08/25/15 mild cortical atrophy and chronic small vessel ischemic change. atheroscloerosis. paranasal sinus disease   . Anosmia   . Asthma   . Chicken pox   . COPD (chronic obstructive pulmonary disease) (HCC)   . Emphysema of lung (HCC)    per daughter never smoker exposed 2nd have via husband  . Hard of hearing    since childhood  . Hypertension   . Nasal polyps    surgery x 15 years ago and in ~2013 as of 10/08/17  . Syncope    03/2017 thought 2/2 diuretic stopped in [facility]   . UTI (urinary tract infection)    Past Surgical History:  Procedure Laterality Date  . ABDOMINAL HYSTERECTOMY     ? year ? reason ; partial   . BREAST BIOPSY     ? year   . nasal polyps removed     Family History  Problem Relation Age of Onset  . Cancer Mother        ? type  . Other Mother        scarlett fever, skin ulcer, died of complications after MVA     . Arthritis Mother   . Alcohol abuse Paternal Uncle   . Arthritis Father    Social History   Socioeconomic History  . Marital status: Widowed    Spouse name: Not on file  . Number of children: Not on file  . Years of education: Not on file  . Highest education level: Not on file  Social Needs  . Financial resource strain: Not on file  . Food insecurity - worry: Not on file  . Food insecurity - inability: Not on file  . Transportation needs - medical: Not on file  . Transportation needs - non-medical: Not on file  Occupational History  . Not on file  Tobacco Use  . Smoking status: Never Smoker  . Smokeless tobacco: Never Used  Substance and Sexual Activity  . Alcohol use: No  . Drug use: No  . Sexual activity: No  Other Topics Concern  . 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 aspirin EC  81 MG tablet Take 81 mg by mouth daily.  . cetirizine (ZYRTEC) 10 MG tablet Take 1 tablet (10 mg total) by mouth daily.  . Cholecalciferol (VITAMIN D3) 50000 units CAPS Take 1 capsule by mouth once a week.  . donepezil (ARICEPT) 10 MG tablet Take 1 tablet (10 mg total) by mouth at bedtime.  . fluticasone (FLONASE) 50 MCG/ACT nasal spray Place into both nostrils daily as needed for allergies or rhinitis.  . Fluticasone-Salmeterol (ADVAIR) 250-50 MCG/DOSE AEPB Inhale 1 puff into the lungs 2 (two) times daily. Rinse mouth  . FLUZONE HIGH-DOSE 0.5 ML injection TO BE ADMINISTERED BY PHARMACIST FOR IMMUNIZATION  . ibuprofen (ADVIL,MOTRIN) 800 MG tablet Take 0.5 tablets (400 mg total) by mouth every 8 (eight) hours as needed.  Marland Kitchen. losartan (COZAAR) 50 MG tablet Take 1 tablet (50 mg total) by mouth daily.  . memantine (NAMENDA) 10 MG tablet Take 1 tablet (10 mg total) by mouth 2  (two) times daily.  Marland Kitchen. PREVNAR 13 SUSP injection TO BE ADMINISTERED BY PHARMACIST FOR IMMUNIZATION   Allergies  Allergen Reactions  . Latex     rash   No results found for this or any previous visit (from the past 2160 hour(s)). Objective  Body mass index is 24.69 kg/m. Wt Readings from Last 3 Encounters:  01/07/18 161 lb 3.2 oz (73.1 kg)  10/08/17 154 lb 4 oz (70 kg)  07/18/16 150 lb (68 kg)   Temp Readings from Last 3 Encounters:  01/07/18 97.8 F (36.6 C) (Oral)  10/08/17 98.2 F (36.8 C) (Oral)  07/18/16 98 F (36.7 C) (Oral)   BP Readings from Last 3 Encounters:  01/07/18 (!) 142/60  10/08/17 138/68  07/19/16 126/62   Pulse Readings from Last 3 Encounters:  01/07/18 69  10/08/17 (!) 58  07/19/16 (!) 58   O2 sat room air 93%  Physical Exam  Constitutional: She is oriented to person, place, and time and well-developed, well-nourished, and in no distress. Vital signs are normal.  HENT:  Head: Normocephalic and atraumatic.  Mouth/Throat: Oropharynx is clear and moist and mucous membranes are normal.  Eyes: Conjunctivae are normal. Pupils are equal, round, and reactive to light.  Cardiovascular: Normal rate, regular rhythm and normal heart sounds.  Pulmonary/Chest: Effort normal. She has wheezes.  Neurological: She is alert and oriented to person, place, and time. Gait normal. Gait normal.  Skin: Skin is warm and dry.     Scaly red lesions right lower ext ? aks   Psychiatric: Mood, affect and judgment normal. She exhibits abnormal recent memory.  Nursing note and vitals reviewed.   Assessment   1. Fall x 1 w/in 2 weeks  2. COPD with mild flare likely 2/2 med noncompliance  3. Dementia  4. HTN  5. H/o elevated Creatinine  6. HM Plan  1. Pt declines to use walking device  2. rec used advair 1 puff bid rinse mouth, prn Albuterol, prn Mucinex DM/Robitussin DM green label Mucinex  3. D[facility] declines to [facility] for now  4. Cont losartan 50 mg  qd  Check lipid in 2 weeks  5. Check BMET in 2 weeks check A1C h/o prediabetes 6.  Had flu and prevnar 08/11/17.  Unknown h/o Tdap disc today will mail info.  Disc shingrix, pna 23 at f/u need to review or get records  Possibly had zostavx 09/2014  refer to dermatology tbse today check right leg lesion   Per daughter DEXA had in the past and  was on Rx 09/02/14 DEXA reviewed +osteopenia ? If ever had colonoscopy, ? Last mammogram, s/p hysterectomy   Get records Dr. Dirk Dress [place], Dr. Theron Arista Rippey [place] , ENT Dr. Darlis Loan [facility] (he may have copy of vaccines), [facility] [place]  -obtained records Dr. Clarisa Kindred on Metformin 500 mg bid h/o prediabetes , was prev hyzaar-hct 50/12.5 mg qd, h/o uric acid elevation 7.4 05/12/15, h/o RF IgM + 14 12/30/14, hs-CRP 3.3, antiCCP slightly elevated in the past 25 nl <19    Provider: Dr. French Ana McLean-Scocuzza-Internal Medicine

## 2018-01-07 NOTE — Progress Notes (Signed)
Pre visit review using our clinic review tool, if applicable. No additional management support is needed unless otherwise documented below in the visit note. 

## 2018-01-07 NOTE — Patient Instructions (Signed)
Follow up in 4 months sooner if needed  Try Mucinex DM (green label) or Robitussin DM  Use Advair 2x per day rinse your mouth  Use albuterol as needed    Chronic Obstructive Pulmonary Disease Chronic obstructive pulmonary disease (COPD) is a long-term (chronic) lung problem. When you have COPD, it is hard for air to get in and out of your lungs. The way your lungs work will never return to normal. Usually the condition gets worse over time. There are things you can do to keep yourself as healthy as possible. Your doctor may treat your condition with:  Medicines.  Quitting smoking, if you smoke.  Rehabilitation. This may involve a team of specialists.  Oxygen.  Exercise and changes to your diet.  Lung surgery.  Comfort measures (palliative care).  Follow these instructions at home: Medicines  Take over-the-counter and prescription medicines only as told by your doctor.  Talk to your doctor before taking any cough or allergy medicines. You may need to avoid medicines that cause your lungs to be dry. Lifestyle  If you smoke, stop. Smoking makes the problem worse. If you need help quitting, ask your doctor.  Avoid being around things that make your breathing worse. This may include smoke, chemicals, and fumes.  Stay active, but remember to also rest.  Learn and use tips on how to relax.  Make sure you get enough sleep. Most adults need at least 7 hours a night.  Eat healthy foods. Eat smaller meals more often. Rest before meals. Controlled breathing  Learn and use tips on how to control your breathing as told by your doctor. Try: ? Breathing in (inhaling) through your nose for 1 second. Then, pucker your lips and breath out (exhale) through your lips for 2 seconds. ? Putting one hand on your belly (abdomen). Breathe in slowly through your nose for 1 second. Your hand on your belly should move out. Pucker your lips and breathe out slowly through your lips. Your hand on your  belly should move in as you breathe out. Controlled coughing  Learn and use controlled coughing to clear mucus from your lungs. The steps are: 1. Lean your head a little forward. 2. Breathe in deeply. 3. Try to hold your breath for 3 seconds. 4. Keep your mouth slightly open while coughing 2 times. 5. Spit any mucus out into a tissue. 6. Rest and do the steps again 1 or 2 times as needed. General instructions  Make sure you get all the shots (vaccines) that your doctor recommends. Ask your doctor about a flu shot and a pneumonia shot.  Use oxygen therapy and therapy to help improve your lungs (pulmonary rehabilitation) if told by your doctor. If you need home oxygen therapy, ask your doctor if you should buy a tool to measure your oxygen level (oximeter).  Make a COPD action plan with your doctor. This helps you know what to do if you feel worse than usual.  Manage any other conditions you have as told by your doctor.  Avoid going outside when it is very hot, cold, or humid.  Avoid people who have a sickness you can catch (contagious).  Keep all follow-up visits as told by your doctor. This is important. Contact a doctor if:  You cough up more mucus than usual.  There is a change in the color or thickness of the mucus.  It is harder to breathe than usual.  Your breathing is faster than usual.  You have trouble  sleeping.  You need to use your medicines more often than usual.  You have trouble doing your normal activities such as getting dressed or walking around the house. Get help right away if:  You have shortness of breath while resting.  You have shortness of breath that stops you from: ? Being able to talk. ? Doing normal activities.  Your chest hurts for longer than 5 minutes.  Your skin color is more blue than usual.  Your pulse oximeter shows that you have low oxygen for longer than 5 minutes.  You have a fever.  You feel too tired to breathe  normally. Summary  Chronic obstructive pulmonary disease (COPD) is a long-term lung problem.  The way your lungs work will never return to normal. Usually the condition gets worse over time. There are things you can do to keep yourself as healthy as possible.  Take over-the-counter and prescription medicines only as told by your doctor.  If you smoke, stop. Smoking makes the problem worse. This information is not intended to replace advice given to you by your health care provider. Make sure you discuss any questions you have with your health care provider. Document Released: 04/10/2008 Document Revised: 03/30/2016 Document Reviewed: 06/19/2013 Elsevier Interactive Patient Education  2017 [employer]

## 2018-01-28 ENCOUNTER — Other Ambulatory Visit (INDEPENDENT_AMBULATORY_CARE_PROVIDER_SITE_OTHER): Payer: Medicare Other

## 2018-01-28 DIAGNOSIS — Z13220 Encounter for screening for lipoid disorders: Secondary | ICD-10-CM | POA: Diagnosis not present

## 2018-01-28 DIAGNOSIS — Z87898 Personal history of other specified conditions: Secondary | ICD-10-CM

## 2018-01-28 DIAGNOSIS — I10 Essential (primary) hypertension: Secondary | ICD-10-CM

## 2018-01-28 LAB — LIPID PANEL
Cholesterol: 176 mg/dL (ref 0–200)
HDL: 59.7 mg/dL (ref >39.00)
LDL Cholesterol: 100 mg/dL — ABNORMAL HIGH (ref 0–99)
NonHDL: 116.49
TRIGLYCERIDES: 83 mg/dL (ref 0.0–149.0)
Total CHOL/HDL Ratio: 3
VLDL: 16.6 mg/dL (ref 0.0–40.0)

## 2018-01-28 LAB — BASIC METABOLIC PANEL
BUN: 27 mg/dL — AB (ref 6–23)
CALCIUM: 9.4 mg/dL (ref 8.4–10.5)
CHLORIDE: 108 meq/L (ref 96–112)
CO2: 28 meq/L (ref 19–32)
CREATININE: 1.21 mg/dL — AB (ref 0.40–1.20)
GFR: 44.14 mL/min — ABNORMAL LOW (ref >60.00)
GLUCOSE: 87 mg/dL (ref 70–99)
Potassium: 4.6 mEq/L (ref 3.5–5.1)
Sodium: 144 mEq/L (ref 135–145)

## 2018-01-28 LAB — HEMOGLOBIN A1C: HEMOGLOBIN A1C: 6.1 % (ref 4.6–6.5)

## 2018-03-18 ENCOUNTER — Other Ambulatory Visit: Payer: Self-pay | Admitting: Internal Medicine

## 2018-03-18 DIAGNOSIS — E559 Vitamin D deficiency, unspecified: Secondary | ICD-10-CM

## 2018-03-18 MED ORDER — VITAMIN D3 1.25 MG (50000 UT) PO CAPS: 1.0000 | ORAL_CAPSULE | ORAL | 0 refills | Status: DC

## 2018-04-02 ENCOUNTER — Other Ambulatory Visit: Payer: Self-pay | Admitting: Internal Medicine

## 2018-04-02 DIAGNOSIS — I10 Essential (primary) hypertension: Secondary | ICD-10-CM

## 2018-04-02 MED ORDER — LOSARTAN POTASSIUM 50 MG PO TABS: 50.0000 mg | ORAL_TABLET | Freq: Every day | ORAL | 1 refills | Status: DC

## 2018-05-13 ENCOUNTER — Encounter: Payer: Self-pay | Admitting: Internal Medicine

## 2018-05-13 ENCOUNTER — Ambulatory Visit: Payer: Medicare Other | Admitting: Internal Medicine

## 2018-05-13 VITALS — BP 126/44 | HR 65 | Temp 97.8°F | Ht 67.75 in | Wt 156.0 lb

## 2018-05-13 DIAGNOSIS — F0390 Unspecified dementia without behavioral disturbance: Secondary | ICD-10-CM

## 2018-05-13 DIAGNOSIS — N3000 Acute cystitis without hematuria: Secondary | ICD-10-CM

## 2018-05-13 DIAGNOSIS — R0689 Other abnormalities of breathing: Secondary | ICD-10-CM | POA: Diagnosis not present

## 2018-05-13 DIAGNOSIS — J449 Chronic obstructive pulmonary disease, unspecified: Secondary | ICD-10-CM | POA: Diagnosis not present

## 2018-05-13 DIAGNOSIS — N183 Chronic kidney disease, stage 3 unspecified: Secondary | ICD-10-CM

## 2018-05-13 DIAGNOSIS — L570 Actinic keratosis: Secondary | ICD-10-CM

## 2018-05-13 DIAGNOSIS — R8290 Unspecified abnormal findings in urine: Secondary | ICD-10-CM | POA: Diagnosis not present

## 2018-05-13 DIAGNOSIS — I10 Essential (primary) hypertension: Secondary | ICD-10-CM | POA: Diagnosis not present

## 2018-05-13 LAB — COMPREHENSIVE METABOLIC PANEL
ALBUMIN: 3.7 g/dL (ref 3.5–5.2)
ALT: 14 U/L (ref 0–35)
AST: 15 U/L (ref 0–37)
Alkaline Phosphatase: 60 U/L (ref 39–117)
BUN: 27 mg/dL — ABNORMAL HIGH (ref 6–23)
CHLORIDE: 107 meq/L (ref 96–112)
CO2: 29 mEq/L (ref 19–32)
CREATININE: 1.23 mg/dL — AB (ref 0.40–1.20)
Calcium: 9.4 mg/dL (ref 8.4–10.5)
GFR: 43.28 mL/min — ABNORMAL LOW (ref >60.00)
Glucose, Bld: 142 mg/dL — ABNORMAL HIGH (ref 70–99)
Potassium: 4.4 mEq/L (ref 3.5–5.1)
Sodium: 143 mEq/L (ref 135–145)
Total Bilirubin: 0.5 mg/dL (ref 0.2–1.2)
Total Protein: 6.3 g/dL (ref 6.0–8.3)

## 2018-05-13 LAB — CBC WITH DIFFERENTIAL/PLATELET
Basophils Absolute: 0.1 10*3/uL (ref 0.0–0.1)
Basophils Relative: 1.2 % (ref 0.0–3.0)
EOS ABS: 0.6 10*3/uL (ref 0.0–0.7)
Eosinophils Relative: 6.8 % — ABNORMAL HIGH (ref 0.0–5.0)
HCT: 41.1 % (ref 36.0–46.0)
HEMOGLOBIN: 13.6 g/dL (ref 12.0–15.0)
LYMPHS ABS: 2.5 10*3/uL (ref 0.7–4.0)
Lymphocytes Relative: 28.3 % (ref 12.0–46.0)
MCHC: 33.2 g/dL (ref 30.0–36.0)
MCV: 94.1 fl (ref 78.0–100.0)
MONO ABS: 0.8 10*3/uL (ref 0.1–1.0)
Monocytes Relative: 8.8 % (ref 3.0–12.0)
NEUTROS PCT: 54.9 % (ref 43.0–77.0)
Neutro Abs: 4.9 10*3/uL (ref 1.4–7.7)
Platelets: 205 10*3/uL (ref 150.0–400.0)
RBC: 4.36 Mil/uL (ref 3.87–5.11)
RDW: 14.3 % (ref 11.5–15.5)
WBC: 8.9 10*3/uL (ref 4.0–10.5)

## 2018-05-13 MED ORDER — ALBUTEROL SULFATE (2.5 MG/3ML) 0.083% IN NEBU
2.5000 mg | INHALATION_SOLUTION | Freq: Once | RESPIRATORY_TRACT | Status: AC
  Administered 2018-05-13: 2.5 mg via RESPIRATORY_TRACT

## 2018-05-13 MED ORDER — LOSARTAN POTASSIUM 25 MG PO TABS: 25.0000 mg | ORAL_TABLET | Freq: Every day | ORAL | 1 refills | Status: DC

## 2018-05-13 MED ORDER — IPRATROPIUM BROMIDE 0.02 % IN SOLN
0.5000 mg | Freq: Once | RESPIRATORY_TRACT | Status: AC
  Administered 2018-05-13: 0.5 mg via RESPIRATORY_TRACT

## 2018-05-13 MED ORDER — IPRATROPIUM-ALBUTEROL 0.5-2.5 (3) MG/3ML IN SOLN: 3.0000 mL | Freq: Three times a day (TID) | RESPIRATORY_TRACT | 11 refills | Status: DC | PRN

## 2018-05-13 NOTE — Progress Notes (Signed)
Chief Complaint  Patient presents with  . Follow-up   F/u with daughter [name]  1. HTN low normal today daughter reports appetite down and does not eat lunch and has to be qued to eat. Pt is not dizzy/lightheaded  2. 2 weeks ago had crying spell but a lot of changes a home I.e caretaker on vacation and workers at the house daughter states not doing better  3. RLE Ak and SK just LN2 with dermatology on [facility] av 04/2018 and due to f/u 10/2018 for tbse  4. COPD with chronic cough not using Advair, Albuterol inhalers hard to get pt to use per daughter. Pt overall feeling well other than cough w/o fever or sob.    Review of Systems  Constitutional: Positive for weight loss. Negative for fever.       Down 5 lbs   HENT: Negative for hearing loss.   Respiratory: Positive for cough. Negative for shortness of breath.   Cardiovascular: Negative for chest pain.  Musculoskeletal: Negative for falls.  Neurological: Negative for dizziness.  Psychiatric/Behavioral: Positive for memory loss.   Past Medical History:  Diagnosis Date  . Allergy   . Alzheimer's dementia    CT head 08/25/15 mild cortical atrophy and chronic small vessel ischemic change. atheroscloerosis. paranasal sinus disease   . Anosmia   . Asthma   . Chicken pox   . COPD (chronic obstructive pulmonary disease) (HCC)   . Emphysema of lung (HCC)    per daughter never smoker exposed 2nd have via husband  . Hard of hearing    since childhood  . Hypertension   . Nasal polyps    surgery x 15 years ago and in ~2013 as of 10/08/17  . Syncope    03/2017 thought 2/2 diuretic stopped in [facility]   . UTI (urinary tract infection)    Past Surgical History:  Procedure Laterality Date  . ABDOMINAL HYSTERECTOMY     ? year ? reason ; partial   . BREAST BIOPSY     ? year   . nasal polyps removed     Family History  Problem Relation Age of Onset  . Cancer Mother        ? type  . Other Mother        scarlett fever, skin ulcer, died of  complications after MVA   . Arthritis Mother   . Alcohol abuse Paternal Uncle   . Arthritis Father    Social History   Socioeconomic History  . Marital status: Widowed    Spouse name: Not on file  . Number of children: Not on file  . Years of education: Not on file  . Highest education level: Not on file  Occupational History  . Not on file  Social Needs  . Financial resource strain: Not on file  . Food insecurity:    Worry: Not on file    Inability: Not on file  . Transportation needs:    Medical: Not on file    Non-medical: Not on file  Tobacco Use  . Smoking status: Never Smoker  . Smokeless tobacco: Never Used  Substance and Sexual Activity  . Alcohol use: No  . Drug use: No  . Sexual activity: Never  Lifestyle  . Physical activity:    Days per week: Not on file    Minutes per session: Not on file  . Stress: Not on file  Relationships  . Social connections:    Talks on phone: Not on file  Gets together: Not on file    Attends religious service: Not on file    Active member of club or organization: Not on file    Attends meetings of clubs or organizations: Not on file    Relationship status: Not on file  . Intimate partner violence:    Fear of current or ex partner: Not on file    Emotionally abused: Not on file    Physically abused: Not on file    Forced sexual activity: Not on file  Other Topics Concern  . 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 albuterol (PROVENTIL HFA;VENTOLIN HFA) 108 (90 Base) MCG/ACT inhaler Inhale 1-2 puffs into the lungs every 6 (six) hours as needed for wheezing or shortness of breath (cough).  Marland Kitchen aspirin EC 81 MG tablet Take 81 mg by mouth daily.  . cetirizine (ZYRTEC) 10 MG tablet Take 1 tablet (10 mg total)  by mouth daily.  . Cholecalciferol (VITAMIN D3) 50000 units CAPS Take 1 capsule by mouth once a week.  . donepezil (ARICEPT) 10 MG tablet Take 1 tablet (10 mg total) by mouth at bedtime.  . fluticasone (FLONASE) 50 MCG/ACT nasal spray Place into both nostrils daily as needed for allergies or rhinitis.  . Fluticasone-Salmeterol (ADVAIR) 250-50 MCG/DOSE AEPB Inhale 1 puff into the lungs 2 (two) times daily. Rinse mouth  . FLUZONE HIGH-DOSE 0.5 ML injection TO BE ADMINISTERED BY PHARMACIST FOR IMMUNIZATION  . ibuprofen (ADVIL,MOTRIN) 800 MG tablet Take 0.5 tablets (400 mg total) by mouth every 8 (eight) hours as needed.  Marland Kitchen losartan (COZAAR) 25 MG tablet Take 1 tablet (25 mg total) by mouth daily.  . memantine (NAMENDA) 10 MG tablet Take 1 tablet (10 mg total) by mouth 2 (two) times daily.  Marland Kitchen PREVNAR 13 SUSP injection TO BE ADMINISTERED BY PHARMACIST FOR IMMUNIZATION  . [DISCONTINUED] losartan (COZAAR) 50 MG tablet Take 1 tablet (50 mg total) by mouth daily.   Allergies  Allergen Reactions  . Latex     rash   No results found for this or any previous visit (from the past 2160 hour(s)). Objective  Body mass index is 23.9 kg/m. Wt Readings from Last 3 Encounters:  05/13/18 156 lb (70.8 kg)  01/07/18 161 lb 3.2 oz (73.1 kg)  10/08/17 154 lb 4 oz (70 kg)   Temp Readings from Last 3 Encounters:  05/13/18 97.8 F (36.6 C) (Oral)  01/07/18 97.8 F (36.6 C) (Oral)  10/08/17 98.2 F (36.8 C) (Oral)   BP Readings from Last 3 Encounters:  05/13/18 (!) 126/44  01/07/18 (!) 142/60  10/08/17 138/68   Pulse Readings from Last 3 Encounters:  05/13/18 65  01/07/18 69  10/08/17 (!) 58    Physical Exam  Constitutional: She is oriented to person, place, and time. Vital signs are normal. She appears well-developed and well-nourished. She is cooperative.  HENT:  Head: Normocephalic and atraumatic.  Mouth/Throat: Oropharynx is clear and moist and mucous membranes are normal.  Eyes: Pupils are  equal, round, and reactive to light. Conjunctivae are normal.  Cardiovascular: Normal rate and regular rhythm.  Murmur heard. Pulmonary/Chest: Effort normal and breath sounds normal.  Neurological: She is alert  and oriented to person, place, and time. Gait normal.  Skin: Skin is warm, dry and intact.     Psychiatric: She has a normal mood and affect. Her speech is normal and behavior is normal. Judgment and thought content normal. Cognition and memory are normal.  Nursing note and vitals reviewed.   Assessment   1. HTN with BP hypotensive today  2. Dementia with variable mood 3. Aks, SK right lower ext  4. COPD  5. HM 6. CKD 3  Plan  1. Reduce losartan 50 to 25 mg qd  Encouraged po intake and hydration  CMET, CBC, UA and culture today to r/o infection as cause   2. Cont meds for now disc consider remeron in future vs celexa  3. F/u dermatology [address] 10/2018  4. duoneb given today  Will order machine and medication s 5.  Had flu and prevnar 08/11/17.  Disc Tdap, shingrix, pna 23 in future  Possibly had zostavx 09/2014 Dermatology see above   09/02/14 DEXA reviewed +osteopenia ? If ever had colonoscopy, ? Last mammogram, s/p hysterectomy  Out of age window all  6. Monitor kidneys    Provider: Dr. French Anaracy McLean-Scocuzza-Internal Medicine

## 2018-05-13 NOTE — Patient Instructions (Addendum)
Premier protein shake  Cut losartan in 1/2 25 mg total per day new prescription is 25 mg daily  F/u in 4-5 months  Encourage eating and drinking  We will check urine today to rule out infection and blood work to look at your kidneys and blood counts

## 2018-05-13 NOTE — Progress Notes (Signed)
Pre visit review using our clinic review tool, if applicable. No additional management support is needed unless otherwise documented below in the visit note. 

## 2018-05-14 LAB — URINALYSIS, ROUTINE W REFLEX MICROSCOPIC
BILIRUBIN UA: NEGATIVE
Glucose, UA: NEGATIVE
NITRITE UA: POSITIVE — AB
PH UA: 5 (ref 5.0–7.5)
Protein, UA: NEGATIVE
RBC UA: NEGATIVE
Specific Gravity, UA: 1.023 (ref 1.005–1.030)
UUROB: 0.2 mg/dL (ref 0.2–1.0)

## 2018-05-14 LAB — MICROSCOPIC EXAMINATION: CASTS: NONE SEEN /LPF

## 2018-05-16 ENCOUNTER — Other Ambulatory Visit: Payer: Self-pay | Admitting: Internal Medicine

## 2018-05-16 DIAGNOSIS — N3000 Acute cystitis without hematuria: Secondary | ICD-10-CM

## 2018-05-16 LAB — URINE CULTURE

## 2018-05-16 MED ORDER — AMOXICILLIN-POT CLAVULANATE 875-125 MG PO TABS: 1.0000 | ORAL_TABLET | Freq: Two times a day (BID) | ORAL | 0 refills | Status: DC

## 2018-05-29 ENCOUNTER — Other Ambulatory Visit: Payer: Self-pay | Admitting: Internal Medicine

## 2018-05-29 DIAGNOSIS — F0390 Unspecified dementia without behavioral disturbance: Secondary | ICD-10-CM

## 2018-05-29 MED ORDER — DONEPEZIL HCL 10 MG PO TABS: 10.0000 mg | ORAL_TABLET | Freq: Every day | ORAL | 3 refills | Status: DC

## 2018-06-10 ENCOUNTER — Telehealth: Payer: Self-pay | Admitting: Internal Medicine

## 2018-06-10 ENCOUNTER — Other Ambulatory Visit: Payer: Self-pay | Admitting: Internal Medicine

## 2018-06-10 DIAGNOSIS — J4520 Mild intermittent asthma, uncomplicated: Secondary | ICD-10-CM

## 2018-06-10 MED ORDER — FLUTICASONE-SALMETEROL 250-50 MCG/DOSE IN AEPB: 1.0000 | INHALATION_SPRAY | Freq: Two times a day (BID) | RESPIRATORY_TRACT | 5 refills | Status: DC

## 2018-06-10 NOTE — Telephone Encounter (Signed)
Mychart message has been sent to patient informing them of further directions.

## 2018-06-10 NOTE — Telephone Encounter (Signed)
Call daughter and rec take D3 5000 Iu daily stop weekly D3 high dose   TMS

## 2018-06-11 ENCOUNTER — Telehealth: Payer: Self-pay | Admitting: Internal Medicine

## 2018-06-11 NOTE — Telephone Encounter (Signed)
Inform pts daughter pt can stop 50K weekly and take otc D3 2000-5000 IU daily  Fax d/c order to pharmacy   TMS

## 2018-06-12 NOTE — Telephone Encounter (Signed)
MYchart message has been sent to inform patient.

## 2018-06-28 ENCOUNTER — Ambulatory Visit: Payer: Self-pay | Admitting: Internal Medicine

## 2018-06-28 NOTE — Telephone Encounter (Signed)
Daughter, Brayton Layman called in for her mother.  Daughter noticed about a week ago that her mother's cough sounds different and that she is very tired.   She has COPD but this cough is different than her usual COPD cough.  She is not coughing anything up.  She has a more frequent cough.   She is using a nebulizer twice a day.    She can use it up to 3 times a day but it's good if we can get her to do it twice a day.    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   "If she knew I was calling you now she would be upset".   "She is allergic to doctors". They just started monitoring her pulse oximeter readings and she is staying between 88-90%.   Don't know what she normally runs since they just started monitoring this.   The lowest she saw it was 85% once time last week and 92% has been the highest.  Per the protocol she needs to be seen within 24 hours however there are no openings at [facility] today.   Daughter requested an appt on Monday.   "i'm open all day Monday and can bring her in any time".    I made her an appt with Leanora Cover, FNP-C for Monday 07/01/18 at 10:30am. I instructed [name] to take her mother to an urgent care or ED over the weekend if her condition becomes worse.   Signs to look for are:   Productive cough, fever, wheezing, shortness of breath, nebulizer treatments not helping, pulse oximeter readings lower than 88% and staying low even with treatments or breathing becomes labored.    [name] verbalized understanding of these instructions and was agreeable to this plan.    Reason for Disposition . [1] Continuous (nonstop) coughing interferes with work or school AND [2] no improvement using cough treatment per protocol  Answer Assessment - Initial Assessment Questions 1. ONSET: "When did the cough begin?"      Started a week ago I noticed cough sounded different.   She has COPD.   Daughter is calling in.   [name] calling in.    2. SEVERITY: "How bad is the cough  today?"      It's been the same for the last week.    Her pulse ox is 88-90% over the last week.    She is very tired.   Hard to get her up and going.   No energy.   Coughing is normal but this is more and sounds different. 3. RESPIRATORY DISTRESS: "Describe your breathing."      Short of breath.   Can't make it from the house to the car.   She has to sit down and rest. 4. FEVER: "Do you have a fever?" If so, ask: "What is your temperature, how was it measured, and when did it start?"     No.    My mother is not a complainer.   She is a returned nurse. 5. HEMOPTYSIS: "Are you coughing up any blood?" If so ask: "How much?" (flecks, streaks, tablespoons, etc.)     No 6. TREATMENT: "What have you done so far to treat the cough?" (e.g., meds, fluids, humidifier)     Nebulizer twice a day.    Twice is good if I can get her to do it. 7. CARDIAC HISTORY: "Do you have any history of heart disease?" (e.g., heart attack, congestive heart failure)  No history.   Heart not great but elected not to do anything about it. 8. LUNG HISTORY: "Do you have any history of lung disease?"  (e.g., pulmonary embolus, asthma, emphysema)     COPD.    Been told her she has flash emphysema.    She only smoked a year in collelge. 9. PE RISK FACTORS: "Do you have a history of blood clots?" (or: recent major surgery, recent prolonged travel, bedridden)     None of above. 10. OTHER SYMPTOMS: "Do you have any other symptoms? (e.g., runny nose, wheezing, chest pain)       No 11. PREGNANCY: "Is there any chance you are pregnant?" "When was your last menstrual period?"       Not asked due to age. 12. TRAVEL: "Have you traveled out of the country in the last month?" (e.g., travel history, exposures)       No  Protocols used: COUGH - ACUTE NON-PRODUCTIVE-A-AH

## 2018-07-01 ENCOUNTER — Ambulatory Visit (INDEPENDENT_AMBULATORY_CARE_PROVIDER_SITE_OTHER): Payer: Medicare Other

## 2018-07-01 ENCOUNTER — Ambulatory Visit: Payer: Medicare Other | Admitting: Family Medicine

## 2018-07-01 ENCOUNTER — Other Ambulatory Visit: Payer: Self-pay

## 2018-07-01 ENCOUNTER — Encounter: Payer: Self-pay | Admitting: Family Medicine

## 2018-07-01 VITALS — BP 110/60 | HR 69 | Temp 97.8°F | Ht 65.75 in | Wt 154.0 lb

## 2018-07-01 DIAGNOSIS — J449 Chronic obstructive pulmonary disease, unspecified: Secondary | ICD-10-CM

## 2018-07-01 DIAGNOSIS — R062 Wheezing: Secondary | ICD-10-CM

## 2018-07-01 MED ORDER — IPRATROPIUM BROMIDE 0.02 % IN SOLN: 0.5000 mg | Freq: Four times a day (QID) | RESPIRATORY_TRACT | 12 refills | Status: DC

## 2018-07-01 MED ORDER — ALBUTEROL SULFATE (2.5 MG/3ML) 0.083% IN NEBU
2.5000 mg | INHALATION_SOLUTION | Freq: Once | RESPIRATORY_TRACT | Status: AC
  Administered 2018-07-01: 2.5 mg via RESPIRATORY_TRACT

## 2018-07-01 MED ORDER — PREDNISONE 10 MG PO TABS: 10.0000 mg | ORAL_TABLET | Freq: Every day | ORAL | 0 refills | Status: AC

## 2018-07-01 MED ORDER — IPRATROPIUM BROMIDE 0.02 % IN SOLN
0.5000 mg | Freq: Once | RESPIRATORY_TRACT | Status: AC
  Administered 2018-07-01: 0.5 mg via RESPIRATORY_TRACT

## 2018-07-01 NOTE — Patient Instructions (Signed)
Do DuoNeb nebulizer 3 times daily, may do another 1 time per day as needed.

## 2018-07-01 NOTE — Progress Notes (Signed)
   Subjective:    Patient ID: Makayla [name], female    DOB: September 24, 1923, 84 y.o.   MRN: [id number]  HPI  Presents to clinic with cough and O2 sat ranging in 85-91%. She has a long history of COPD.  Daughter reports patient is to take Advair for COPD management, but this medication has become harder for her to use.  Currently she is been doing DuoNeb nebulizer treatments 2 times per day.  Patient and daughter deny any fever or chills.  Daughter states there is no discolored phlegm with cough.  She has flu vaccine 2018/19 season in October 2018 and she had prevnar 13 vaccine in October 2018  Patient Active Problem List   Diagnosis Date Noted  . AK (actinic keratosis) 05/13/2018  . History of prediabetes 01/07/2018  . Abnormal gait 01/07/2018  . CKD (chronic kidney disease) stage 3, GFR 30-59 ml/min (HCC) 11/25/2017  . COPD (chronic obstructive pulmonary disease) (HCC) 11/25/2017  . HLD (hyperlipidemia) 11/25/2017  . Arthritis 11/25/2017  . Carotid artery stenosis 11/25/2017  . Diastolic dysfunction 11/25/2017  . Aortic valve sclerosis 11/25/2017  . Essential hypertension 10/08/2017  . Dementia without behavioral disturbance 10/08/2017  . Mild intermittent asthma without complication 10/08/2017  . Heart murmur 10/08/2017  . Thrombophlebitis leg 07/19/2016   Social History   Tobacco Use  . Smoking status: Never Smoker  . Smokeless tobacco: Never Used  Substance Use Topics  . Alcohol use: No    Review of Systems  Constitutional: Negative for chills, fatigue and fever.  HENT: Negative for congestion, ear pain, sinus pain and sore throat.   Eyes: Negative.   Respiratory:  + cough, shortness of breath and wheezing.   Cardiovascular: Negative for chest pain, palpitations and leg swelling.  Gastrointestinal: Negative for abdominal pain, diarrhea, nausea and vomiting.  Genitourinary: Negative for dysuria, frequency and urgency.  Musculoskeletal: Negative for arthralgias and myalgias.    Skin: Negative for color change, pallor and rash.  Neurological: Negative for syncope, light-headedness and headaches.  Psychiatric/Behavioral: The patient is not nervous/anxious.       Objective:   Physical Exam  Constitutional: She is oriented to person, place, and time. She appears well-developed and well-nourished.  HENT:  Head: Normocephalic and atraumatic.  Eyes: No scleral icterus.  Neck: Neck supple. No tracheal deviation present.  Cardiovascular: Normal rate and regular rhythm.  Pulmonary/Chest: Effort normal. No respiratory distress. She has wheezes.  Diffuse wheezes throughout lung fields  Musculoskeletal:  Gait normal  Neurological: She is alert and oriented to person, place, and time.  Patient does have dementia, but is A&O today  Skin: Skin is warm and dry. No pallor.  Psychiatric: She has a normal mood and affect. Her behavior is normal.  Nursing note and vitals reviewed.     Vitals:   07/01/18 1034  BP: 110/60  Pulse: 69  Temp: 97.8 F (36.6 C)  SpO2: 91%    Assessment & Plan:    COPD -- Do DuoNeb nebulizer 3 times daily, may do another 1 time per day as needed. She will also take 5 day prednisone burst, 10mg  per day  Wheezes -- DuoNeb given in clinic x1.  CXR ordered to rule out any pneumonia.    Keep appt in November as already planned

## 2018-07-03 ENCOUNTER — Telehealth: Payer: Self-pay | Admitting: Internal Medicine

## 2018-07-03 NOTE — Telephone Encounter (Signed)
Patient has been sent mychart to inform patient

## 2018-07-03 NOTE — Telephone Encounter (Signed)
Inform pt to d/c D3 weekly and take otc 2000 D3 2000-5000 iu daily  ' TMS

## 2018-07-11 ENCOUNTER — Telehealth: Payer: Self-pay | Admitting: Internal Medicine

## 2018-07-11 NOTE — Telephone Encounter (Signed)
Daughter given lab results per notes of Leanora Cover FNP on 07/01/18 ( on [place]). verbalized understanding.

## 2018-09-04 ENCOUNTER — Other Ambulatory Visit: Payer: Self-pay

## 2018-09-04 ENCOUNTER — Inpatient Hospital Stay
Admission: EM | Admit: 2018-09-04 | Discharge: 2018-09-08 | DRG: 192 | Disposition: A | Discharge status: Home / Self Care | Payer: Medicare Other | Attending: Internal Medicine | Admitting: Internal Medicine

## 2018-09-04 ENCOUNTER — Emergency Department: Payer: Medicare Other

## 2018-09-04 DIAGNOSIS — Z7952 Long term (current) use of systemic steroids: Secondary | ICD-10-CM

## 2018-09-04 DIAGNOSIS — F03918 Unspecified dementia, unspecified severity, with other behavioral disturbance: Secondary | ICD-10-CM | POA: Diagnosis present

## 2018-09-04 DIAGNOSIS — Z7951 Long term (current) use of inhaled steroids: Secondary | ICD-10-CM

## 2018-09-04 DIAGNOSIS — I509 Heart failure, unspecified: Secondary | ICD-10-CM | POA: Diagnosis present

## 2018-09-04 DIAGNOSIS — F0280 Dementia in other diseases classified elsewhere without behavioral disturbance: Secondary | ICD-10-CM | POA: Diagnosis present

## 2018-09-04 DIAGNOSIS — E785 Hyperlipidemia, unspecified: Secondary | ICD-10-CM | POA: Diagnosis present

## 2018-09-04 DIAGNOSIS — R0902 Hypoxemia: Secondary | ICD-10-CM | POA: Diagnosis present

## 2018-09-04 DIAGNOSIS — F0391 Unspecified dementia with behavioral disturbance: Secondary | ICD-10-CM | POA: Diagnosis present

## 2018-09-04 DIAGNOSIS — I1310 Hypertensive heart and chronic kidney disease without heart failure, with stage 1 through stage 4 chronic kidney disease, or unspecified chronic kidney disease: Secondary | ICD-10-CM | POA: Diagnosis present

## 2018-09-04 DIAGNOSIS — Z87891 Personal history of nicotine dependence: Secondary | ICD-10-CM

## 2018-09-04 DIAGNOSIS — R0602 Shortness of breath: Secondary | ICD-10-CM

## 2018-09-04 DIAGNOSIS — Z79899 Other long term (current) drug therapy: Secondary | ICD-10-CM

## 2018-09-04 DIAGNOSIS — I10 Essential (primary) hypertension: Secondary | ICD-10-CM | POA: Diagnosis present

## 2018-09-04 DIAGNOSIS — Z66 Do not resuscitate: Secondary | ICD-10-CM | POA: Diagnosis present

## 2018-09-04 DIAGNOSIS — G309 Alzheimer's disease, unspecified: Secondary | ICD-10-CM | POA: Diagnosis present

## 2018-09-04 DIAGNOSIS — Z90710 Acquired absence of both cervix and uterus: Secondary | ICD-10-CM

## 2018-09-04 DIAGNOSIS — N183 Chronic kidney disease, stage 3 unspecified: Secondary | ICD-10-CM | POA: Diagnosis present

## 2018-09-04 DIAGNOSIS — H9190 Unspecified hearing loss, unspecified ear: Secondary | ICD-10-CM | POA: Diagnosis present

## 2018-09-04 DIAGNOSIS — Z7982 Long term (current) use of aspirin: Secondary | ICD-10-CM

## 2018-09-04 DIAGNOSIS — F0390 Unspecified dementia without behavioral disturbance: Secondary | ICD-10-CM | POA: Diagnosis present

## 2018-09-04 DIAGNOSIS — R059 Cough, unspecified: Secondary | ICD-10-CM

## 2018-09-04 DIAGNOSIS — L570 Actinic keratosis: Secondary | ICD-10-CM | POA: Diagnosis present

## 2018-09-04 DIAGNOSIS — J441 Chronic obstructive pulmonary disease with (acute) exacerbation: Principal | ICD-10-CM | POA: Diagnosis present

## 2018-09-04 DIAGNOSIS — R05 Cough: Secondary | ICD-10-CM | POA: Diagnosis not present

## 2018-09-04 DIAGNOSIS — Z91040 Latex allergy status: Secondary | ICD-10-CM

## 2018-09-04 DIAGNOSIS — I350 Nonrheumatic aortic (valve) stenosis: Secondary | ICD-10-CM | POA: Diagnosis present

## 2018-09-04 LAB — CBC WITH DIFFERENTIAL/PLATELET
ABS IMMATURE GRANULOCYTES: 0.09 10*3/uL — AB (ref 0.00–0.07)
Basophils Absolute: 0.1 10*3/uL (ref 0.0–0.1)
Basophils Relative: 1 %
Eosinophils Absolute: 0.6 10*3/uL — ABNORMAL HIGH (ref 0.0–0.5)
Eosinophils Relative: 5 %
HCT: 44.3 % (ref 36.0–46.0)
HEMOGLOBIN: 14.4 g/dL (ref 12.0–15.0)
IMMATURE GRANULOCYTES: 1 %
LYMPHS ABS: 1.6 10*3/uL (ref 0.7–4.0)
Lymphocytes Relative: 13 %
MCH: 30.6 pg (ref 26.0–34.0)
MCHC: 32.5 g/dL (ref 30.0–36.0)
MCV: 94.3 fL (ref 80.0–100.0)
MONOS PCT: 6 %
Monocytes Absolute: 0.8 10*3/uL (ref 0.1–1.0)
NEUTROS PCT: 74 %
Neutro Abs: 9.2 10*3/uL — ABNORMAL HIGH (ref 1.7–7.7)
Platelets: 311 10*3/uL (ref 150–400)
RBC: 4.7 MIL/uL (ref 3.87–5.11)
RDW: 13.4 % (ref 11.5–15.5)
WBC: 12.2 10*3/uL — ABNORMAL HIGH (ref 4.0–10.5)
nRBC: 0 % (ref 0.0–0.2)

## 2018-09-04 LAB — COMPREHENSIVE METABOLIC PANEL
ALBUMIN: 3.6 g/dL (ref 3.5–5.0)
ALK PHOS: 79 U/L (ref 38–126)
ALT: 15 U/L (ref 0–44)
ANION GAP: 8 (ref 5–15)
AST: 17 U/L (ref 15–41)
BUN: 29 mg/dL — ABNORMAL HIGH (ref 8–23)
CALCIUM: 9.4 mg/dL (ref 8.9–10.3)
CO2: 27 mmol/L (ref 22–32)
Chloride: 106 mmol/L (ref 98–111)
Creatinine, Ser: 1.37 mg/dL — ABNORMAL HIGH (ref 0.44–1.00)
GFR calc Af Amer: 37 mL/min — ABNORMAL LOW (ref >60)
GFR calc non Af Amer: 32 mL/min — ABNORMAL LOW (ref >60)
GLUCOSE: 150 mg/dL — AB (ref 70–99)
Potassium: 5 mmol/L (ref 3.5–5.1)
SODIUM: 141 mmol/L (ref 135–145)
Total Bilirubin: 0.6 mg/dL (ref 0.3–1.2)
Total Protein: 7 g/dL (ref 6.5–8.1)

## 2018-09-04 MED ORDER — IPRATROPIUM-ALBUTEROL 0.5-2.5 (3) MG/3ML IN SOLN
3.0000 mL | Freq: Once | RESPIRATORY_TRACT | Status: AC
  Administered 2018-09-05: 3 mL via RESPIRATORY_TRACT
  Filled 2018-09-04: qty 3

## 2018-09-04 MED ORDER — ONDANSETRON 4 MG PO TBDP: 4.0000 mg | ORAL_TABLET | Freq: Once | ORAL | Status: DC

## 2018-09-04 MED ORDER — METHYLPREDNISOLONE SODIUM SUCC 125 MG IJ SOLR
125.0000 mg | Freq: Once | INTRAMUSCULAR | Status: AC
  Administered 2018-09-05: 125 mg via INTRAVENOUS
  Filled 2018-09-04: qty 2

## 2018-09-04 MED ORDER — ONDANSETRON HCL 4 MG/2ML IJ SOLN
INTRAMUSCULAR | Status: AC
  Filled 2018-09-04: qty 2

## 2018-09-04 NOTE — ED Triage Notes (Signed)
Patient reports hx of COPD. Patient reports cough, nausea, and weakness X 1 week. Patient been treating with OTC cold medications with no relief. Patient reports dry heaves, denies emesis.

## 2018-09-04 NOTE — ED Provider Notes (Signed)
[facility] Emergency Department Provider Note   ____________________________________________   I have reviewed the triage vital signs and the nursing notes.   HISTORY  Chief Complaint Cough and Weakness   History limited by and level 5 caveat secondary to dementia   HPI Makayla [name] is a 84 y.o. female who presents to the emergency department today accompanied by family because of concerns for increasing weakness and cough.  The family states that the symptoms have been going on for roughly 1 month.  Patient does have a history of breathing difficulty.  They have been trying her nebulizer treatments at home without any significant relief.  As well as the shortness of breath and cough they have noticed some increased weakness.  Per medical record review patient has a history of Alzheimer's and COPD  Past Medical History:  Diagnosis Date  . Allergy   . Alzheimer's dementia (HCC)    CT head 08/25/15 mild cortical atrophy and chronic small vessel ischemic change. atheroscloerosis. paranasal sinus disease   . Anosmia   . Asthma   . Chicken pox   . COPD (chronic obstructive pulmonary disease) (HCC)   . Emphysema of lung (HCC)    per daughter never smoker exposed 2nd have via husband  . Hard of hearing    since childhood  . Hypertension   . Nasal polyps    surgery x 15 years ago and in ~2013 as of 10/08/17  . Syncope    03/2017 thought 2/2 diuretic stopped in [facility]   . UTI (urinary tract infection)     Patient Active Problem List   Diagnosis Date Noted  . AK (actinic keratosis) 05/13/2018  . History of prediabetes 01/07/2018  . Abnormal gait 01/07/2018  . CKD (chronic kidney disease) stage 3, GFR 30-59 ml/min (HCC) 11/25/2017  . COPD (chronic obstructive pulmonary disease) (HCC) 11/25/2017  . HLD (hyperlipidemia) 11/25/2017  . Arthritis 11/25/2017  . Carotid artery stenosis 11/25/2017  . Diastolic dysfunction 11/25/2017  . Aortic valve  sclerosis 11/25/2017  . Essential hypertension 10/08/2017  . Dementia without behavioral disturbance (HCC) 10/08/2017  . Mild intermittent asthma without complication 10/08/2017  . Heart murmur 10/08/2017  . Thrombophlebitis leg 07/19/2016    Past Surgical History:  Procedure Laterality Date  . ABDOMINAL HYSTERECTOMY     ? year ? reason ; partial   . BREAST BIOPSY     ? year   . nasal polyps removed      Prior to Admission medications   Medication Sig Start Date End Date Taking? Authorizing Provider  albuterol (PROVENTIL HFA;VENTOLIN HFA) 108 (90 Base) MCG/ACT inhaler Inhale 1-2 puffs into the lungs every 6 (six) hours as needed for wheezing or shortness of breath (cough). 01/07/18   McLean-Scocuzza, Pasty Spillers, MD  amoxicillin-clavulanate (AUGMENTIN) 875-125 MG tablet Take 1 tablet by mouth 2 (two) times daily. 05/16/18   McLean-Scocuzza, Pasty Spillers, MD  aspirin EC 81 MG tablet Take 81 mg by mouth daily.    [provider]  cetirizine (ZYRTEC) 10 MG tablet Take 1 tablet (10 mg total) by mouth daily. 10/08/17   McLean-Scocuzza, Pasty Spillers, MD  donepezil (ARICEPT) 10 MG tablet Take 1 tablet (10 mg total) by mouth at bedtime. 05/29/18   McLean-Scocuzza, Pasty Spillers, MD  fluticasone (FLONASE) 50 MCG/ACT nasal spray Place into both nostrils daily as needed for allergies or rhinitis.    [provider]  Fluticasone-Salmeterol (ADVAIR) 250-50 MCG/DOSE AEPB Inhale 1 puff into the lungs 2 (two) times  daily. Rinse mouth 06/10/18   McLean-Scocuzza, Pasty Spillers, MD  FLUZONE HIGH-DOSE 0.5 ML injection TO BE ADMINISTERED BY PHARMACIST FOR IMMUNIZATION 08/11/17   [provider]  ibuprofen (ADVIL,MOTRIN) 800 MG tablet Take 0.5 tablets (400 mg total) by mouth every 8 (eight) hours as needed. 07/19/16   Darci Current, MD  ipratropium-albuterol (DUONEB) 0.5-2.5 (3) MG/3ML SOLN Take 3 mLs by nebulization 3 (three) times daily as needed (cough, wheezing, sob). 05/13/18   McLean-Scocuzza, Pasty Spillers, MD   losartan (COZAAR) 25 MG tablet Take 1 tablet (25 mg total) by mouth daily. 05/13/18   McLean-Scocuzza, Pasty Spillers, MD  memantine (NAMENDA) 10 MG tablet Take 1 tablet (10 mg total) by mouth 2 (two) times daily. 12/04/17   McLean-Scocuzza, Pasty Spillers, MD  PREVNAR 13 SUSP injection TO BE ADMINISTERED BY PHARMACIST FOR IMMUNIZATION 08/11/17   [provider]    Allergies Latex  Family History  Problem Relation Age of Onset  . Cancer Mother        ? type  . Other Mother        scarlett fever, skin ulcer, died of complications after MVA   . Arthritis Mother   . Alcohol abuse Paternal Uncle   . Arthritis Father     Social History Social History   Tobacco Use  . Smoking status: Former Games developer  . Smokeless tobacco: Never Used  Substance Use Topics  . Alcohol use: No  . Drug use: No    Review of Systems Constitutional: Positive for generalized weakness Eyes: No visual changes. ENT: No sore throat. Cardiovascular: Denies chest pain. Respiratory: Positive shortness of breath Gastrointestinal: No abdominal pain.  No nausea, no vomiting.  No diarrhea.   Genitourinary: Negative for dysuria. Musculoskeletal: Negative for back pain. Skin: Negative for rash. Neurological: Negative for headaches, focal weakness or numbness.  ____________________________________________   PHYSICAL EXAM:  VITAL SIGNS: ED Triage Vitals  Enc Vitals Group     BP 09/04/18 2113 133/62     Pulse Rate 09/04/18 2113 64     Resp 09/04/18 2113 17     Temp 09/04/18 2113 97.6 F (36.4 C)     Temp Source 09/04/18 2113 Oral     SpO2 09/04/18 2113 (!) 88 %     Weight 09/04/18 2114 154 lb 5.2 oz (70 kg)     Height --      Head Circumference --      Peak Flow --      Pain Score 09/04/18 2114 0   Constitutional: Alert and oriented.  Eyes: Conjunctivae are normal.  ENT      Head: Normocephalic and atraumatic.      Nose: No congestion/rhinnorhea.      Mouth/Throat: Mucous membranes are moist.      Neck:  No stridor. Hematological/Lymphatic/Immunilogical: No cervical lymphadenopathy. Cardiovascular: Normal rate, regular rhythm.  No murmurs, rubs, or gallops. Respiratory: Normal respiratory effort without tachypnea nor retractions.  Somewhat diffuse rhonchi noticed in the lower lungs Gastrointestinal: Soft and non tender. No rebound. No guarding.  Genitourinary: Deferred Musculoskeletal: Normal range of motion in all extremities.  Trace bilateral lower extremity edema Neurologic:  Normal speech and language. No gross focal neurologic deficits are appreciated.  Skin:  Skin is warm, dry and intact. No rash noted. Psychiatric: Mood and affect are normal. Speech and behavior are normal. Patient exhibits appropriate insight and judgment.  ____________________________________________    LABS (pertinent positives/negatives)  CMP na 141, k 5.0, cr 1.37, glu 150 CBC wbc  12.2, hgb 14.4, plt 311  ____________________________________________   EKG  I, Phineas Semen, attending physician, personally viewed and interpreted this EKG  EKG Time: 2119 Rate: 59 Rhythm: sinus bradycardia with 1st degree av block Axis: normal Intervals: qtc 431 QRS: narrow, q waves v2, v3 ST changes: no st elevation Impression: abnormal ekg  ____________________________________________    RADIOLOGY  CXR No acute disease  ____________________________________________   PROCEDURES  Procedures  ____________________________________________   INITIAL IMPRESSION / ASSESSMENT AND PLAN / ED COURSE  Pertinent labs & imaging results that were available during my care of the patient were reviewed by me and considered in my medical decision making (see chart for details).   Patient presented to the emergency department today because of concerns for increasing weakness, cough and some shortness of breath.  Patient does have a history of COPD.  Differential would include COPD exacerbation, pneumonia,  pneumothorax, PE, ACS.  Patient's x-ray without concerning findings.  At this point I think COPD exacerbation likely.  Unfortunately patient was somewhat hypoxic on room air.  At this point I would anticipate admission however discussed with family.  Will give patient steroids and breathing treatments and reassess however I discussed with family low threshold for admission.  ____________________________________________   FINAL CLINICAL IMPRESSION(S) / ED DIAGNOSES  Final diagnoses:  Cough  COPD exacerbation (HCC)     Note: This dictation was prepared with Dragon dictation. Any transcriptional errors that result from this process are unintentional     Phineas Semen, MD 09/05/18 1757

## 2018-09-05 ENCOUNTER — Other Ambulatory Visit: Payer: Self-pay

## 2018-09-05 DIAGNOSIS — Z66 Do not resuscitate: Secondary | ICD-10-CM | POA: Diagnosis present

## 2018-09-05 DIAGNOSIS — I509 Heart failure, unspecified: Secondary | ICD-10-CM | POA: Diagnosis present

## 2018-09-05 DIAGNOSIS — F0280 Dementia in other diseases classified elsewhere without behavioral disturbance: Secondary | ICD-10-CM | POA: Diagnosis present

## 2018-09-05 DIAGNOSIS — L570 Actinic keratosis: Secondary | ICD-10-CM | POA: Diagnosis present

## 2018-09-05 DIAGNOSIS — H9190 Unspecified hearing loss, unspecified ear: Secondary | ICD-10-CM | POA: Diagnosis present

## 2018-09-05 DIAGNOSIS — Z7951 Long term (current) use of inhaled steroids: Secondary | ICD-10-CM | POA: Diagnosis not present

## 2018-09-05 DIAGNOSIS — G309 Alzheimer's disease, unspecified: Secondary | ICD-10-CM | POA: Diagnosis present

## 2018-09-05 DIAGNOSIS — I1310 Hypertensive heart and chronic kidney disease without heart failure, with stage 1 through stage 4 chronic kidney disease, or unspecified chronic kidney disease: Secondary | ICD-10-CM | POA: Diagnosis present

## 2018-09-05 DIAGNOSIS — I350 Nonrheumatic aortic (valve) stenosis: Secondary | ICD-10-CM | POA: Diagnosis present

## 2018-09-05 DIAGNOSIS — E785 Hyperlipidemia, unspecified: Secondary | ICD-10-CM | POA: Diagnosis present

## 2018-09-05 DIAGNOSIS — J441 Chronic obstructive pulmonary disease with (acute) exacerbation: Secondary | ICD-10-CM | POA: Diagnosis present

## 2018-09-05 DIAGNOSIS — Z90710 Acquired absence of both cervix and uterus: Secondary | ICD-10-CM | POA: Diagnosis not present

## 2018-09-05 DIAGNOSIS — Z7982 Long term (current) use of aspirin: Secondary | ICD-10-CM | POA: Diagnosis not present

## 2018-09-05 DIAGNOSIS — Z7952 Long term (current) use of systemic steroids: Secondary | ICD-10-CM | POA: Diagnosis not present

## 2018-09-05 DIAGNOSIS — R05 Cough: Secondary | ICD-10-CM | POA: Diagnosis present

## 2018-09-05 DIAGNOSIS — Z91040 Latex allergy status: Secondary | ICD-10-CM | POA: Diagnosis not present

## 2018-09-05 DIAGNOSIS — N183 Chronic kidney disease, stage 3 (moderate): Secondary | ICD-10-CM | POA: Diagnosis present

## 2018-09-05 DIAGNOSIS — Z87891 Personal history of nicotine dependence: Secondary | ICD-10-CM | POA: Diagnosis not present

## 2018-09-05 DIAGNOSIS — R0902 Hypoxemia: Secondary | ICD-10-CM | POA: Diagnosis present

## 2018-09-05 DIAGNOSIS — Z79899 Other long term (current) drug therapy: Secondary | ICD-10-CM | POA: Diagnosis not present

## 2018-09-05 LAB — BASIC METABOLIC PANEL
Anion gap: 9 (ref 5–15)
BUN: 28 mg/dL — AB (ref 8–23)
CALCIUM: 9 mg/dL (ref 8.9–10.3)
CHLORIDE: 108 mmol/L (ref 98–111)
CO2: 23 mmol/L (ref 22–32)
Creatinine, Ser: 1.24 mg/dL — ABNORMAL HIGH (ref 0.44–1.00)
GFR calc Af Amer: 42 mL/min — ABNORMAL LOW (ref >60)
GFR calc non Af Amer: 36 mL/min — ABNORMAL LOW (ref >60)
Glucose, Bld: 191 mg/dL — ABNORMAL HIGH (ref 70–99)
Potassium: 4.8 mmol/L (ref 3.5–5.1)
SODIUM: 140 mmol/L (ref 135–145)

## 2018-09-05 LAB — CBC
HEMATOCRIT: 41.2 % (ref 36.0–46.0)
Hemoglobin: 13.5 g/dL (ref 12.0–15.0)
MCH: 30.5 pg (ref 26.0–34.0)
MCHC: 32.8 g/dL (ref 30.0–36.0)
MCV: 93 fL (ref 80.0–100.0)
Platelets: 298 10*3/uL (ref 150–400)
RBC: 4.43 MIL/uL (ref 3.87–5.11)
RDW: 13.2 % (ref 11.5–15.5)
WBC: 10.9 10*3/uL — AB (ref 4.0–10.5)
nRBC: 0 % (ref 0.0–0.2)

## 2018-09-05 MED ORDER — LOSARTAN POTASSIUM 25 MG PO TABS
25.0000 mg | ORAL_TABLET | Freq: Every day | ORAL | Status: DC
  Administered 2018-09-05 – 2018-09-08 (×4): 25 mg via ORAL
  Filled 2018-09-05 (×4): qty 1

## 2018-09-05 MED ORDER — ENOXAPARIN SODIUM 30 MG/0.3ML ~~LOC~~ SOLN
30.0000 mg | SUBCUTANEOUS | Status: DC
  Administered 2018-09-05 – 2018-09-07 (×3): 30 mg via SUBCUTANEOUS
  Filled 2018-09-05 (×3): qty 0.3

## 2018-09-05 MED ORDER — ACETAMINOPHEN 650 MG RE SUPP: 650.0000 mg | Freq: Four times a day (QID) | RECTAL | Status: DC | PRN

## 2018-09-05 MED ORDER — SODIUM CHLORIDE 0.9% FLUSH
3.0000 mL | Freq: Two times a day (BID) | INTRAVENOUS | Status: DC
  Administered 2018-09-05 – 2018-09-08 (×7): 3 mL via INTRAVENOUS

## 2018-09-05 MED ORDER — METHYLPREDNISOLONE SODIUM SUCC 125 MG IJ SOLR
60.0000 mg | Freq: Four times a day (QID) | INTRAMUSCULAR | Status: DC
  Administered 2018-09-05 – 2018-09-06 (×4): 60 mg via INTRAVENOUS
  Filled 2018-09-05 (×4): qty 2

## 2018-09-05 MED ORDER — ONDANSETRON HCL 4 MG PO TABS: 4.0000 mg | ORAL_TABLET | Freq: Four times a day (QID) | ORAL | Status: DC | PRN

## 2018-09-05 MED ORDER — DONEPEZIL HCL 5 MG PO TABS
10.0000 mg | ORAL_TABLET | Freq: Every day | ORAL | Status: DC
  Administered 2018-09-05 – 2018-09-07 (×4): 10 mg via ORAL
  Filled 2018-09-05 (×5): qty 2

## 2018-09-05 MED ORDER — MOMETASONE FURO-FORMOTEROL FUM 200-5 MCG/ACT IN AERO
2.0000 | INHALATION_SPRAY | Freq: Two times a day (BID) | RESPIRATORY_TRACT | Status: DC
  Administered 2018-09-05 – 2018-09-06 (×3): 2 via RESPIRATORY_TRACT
  Filled 2018-09-05: qty 8.8

## 2018-09-05 MED ORDER — ASPIRIN EC 81 MG PO TBEC
81.0000 mg | DELAYED_RELEASE_TABLET | Freq: Every day | ORAL | Status: DC
  Administered 2018-09-05 – 2018-09-08 (×4): 81 mg via ORAL
  Filled 2018-09-05 (×4): qty 1

## 2018-09-05 MED ORDER — ACETAMINOPHEN 325 MG PO TABS: 650.0000 mg | ORAL_TABLET | Freq: Four times a day (QID) | ORAL | Status: DC | PRN

## 2018-09-05 MED ORDER — IPRATROPIUM-ALBUTEROL 0.5-2.5 (3) MG/3ML IN SOLN: 3.0000 mL | RESPIRATORY_TRACT | Status: DC | PRN

## 2018-09-05 MED ORDER — AZITHROMYCIN 250 MG PO TABS
500.0000 mg | ORAL_TABLET | Freq: Every day | ORAL | Status: DC
  Administered 2018-09-05 – 2018-09-08 (×4): 500 mg via ORAL
  Filled 2018-09-05 (×4): qty 2

## 2018-09-05 MED ORDER — MEMANTINE HCL 5 MG PO TABS
10.0000 mg | ORAL_TABLET | Freq: Two times a day (BID) | ORAL | Status: DC
  Administered 2018-09-05 – 2018-09-08 (×8): 10 mg via ORAL
  Filled 2018-09-05 (×9): qty 2

## 2018-09-05 MED ORDER — ENOXAPARIN SODIUM 40 MG/0.4ML ~~LOC~~ SOLN: 40.0000 mg | SUBCUTANEOUS | Status: DC

## 2018-09-05 MED ORDER — ONDANSETRON HCL 4 MG/2ML IJ SOLN: 4.0000 mg | Freq: Four times a day (QID) | INTRAMUSCULAR | Status: DC | PRN

## 2018-09-05 NOTE — Progress Notes (Signed)
Patient briefly seen and examined.  Agree with admitting MD.

## 2018-09-05 NOTE — H&P (Signed)
[facility] [place] at [facility]   PATIENT NAME: Makayla [name]    MR#:  [id number]  DATE OF BIRTH:  [birth year]  DATE OF ADMISSION:  09/04/2018  PRIMARY CARE PHYSICIAN: McLean-Scocuzza, Pasty Spillers, MD   REQUESTING/REFERRING PHYSICIAN: Derrill Kay, MD  CHIEF COMPLAINT:   Chief Complaint  Patient presents with  . Cough  . Weakness    HISTORY OF PRESENT ILLNESS:  Makayla [name]  is a 84 y.o. female who presents with chief complaint as above.  Patient presents with a couple days of progressive shortness of breath.  Patient is unable to contribute much information to her history mostly due to dementia.  Her daughter provides a history at bedside.  She states that her mother recently has had difficulty utilizing her maintenance inhalers like her Advair.  She was mildly hypoxic on presentation to the ED today and required some supplemental oxygen, which she is not normally on at home.  She was treated for COPD exacerbation as her work-up was consistent with this, showing no real evidence of infection.  She still had some wheezing and oxygen requirement after treatment in the ED and hospitalist were called for admission  PAST MEDICAL HISTORY:   Past Medical History:  Diagnosis Date  . Allergy   . Alzheimer's dementia (HCC)    CT head 08/25/15 mild cortical atrophy and chronic small vessel ischemic change. atheroscloerosis. paranasal sinus disease   . Anosmia   . Asthma   . Chicken pox   . COPD (chronic obstructive pulmonary disease) (HCC)   . Emphysema of lung (HCC)    per daughter never smoker exposed 2nd have via husband  . Hard of hearing    since childhood  . Hypertension   . Nasal polyps    surgery x 15 years ago and in ~2013 as of 10/08/17  . Syncope    03/2017 thought 2/2 diuretic stopped in [facility]   . UTI (urinary tract infection)      PAST SURGICAL HISTORY:   Past Surgical History:  Procedure Laterality Date  . ABDOMINAL HYSTERECTOMY     ?  year ? reason ; partial   . BREAST BIOPSY     ? year   . nasal polyps removed       SOCIAL HISTORY:   Social History   Tobacco Use  . Smoking status: Former Games developer  . Smokeless tobacco: Never Used  Substance Use Topics  . Alcohol use: No     FAMILY HISTORY:   Family History  Problem Relation Age of Onset  . Cancer Mother        ? type  . Other Mother        scarlett fever, skin ulcer, died of complications after MVA   . Arthritis Mother   . Alcohol abuse Paternal Uncle   . Arthritis Father      DRUG ALLERGIES:   Allergies  Allergen Reactions  . Latex     rash    MEDICATIONS AT HOME:   Prior to Admission medications   Medication Sig Start Date End Date Taking? Authorizing Provider  albuterol (PROVENTIL HFA;VENTOLIN HFA) 108 (90 Base) MCG/ACT inhaler Inhale 1-2 puffs into the lungs every 6 (six) hours as needed for wheezing or shortness of breath (cough). 01/07/18   McLean-Scocuzza, Pasty Spillers, MD  amoxicillin-clavulanate (AUGMENTIN) 875-125 MG tablet Take 1 tablet by mouth 2 (two) times daily. 05/16/18   McLean-Scocuzza, Pasty Spillers, MD  aspirin EC 81 MG tablet Take 81 mg  by mouth daily.    [provider]  cetirizine (ZYRTEC) 10 MG tablet Take 1 tablet (10 mg total) by mouth daily. 10/08/17   McLean-Scocuzza, Pasty Spillers, MD  donepezil (ARICEPT) 10 MG tablet Take 1 tablet (10 mg total) by mouth at bedtime. 05/29/18   McLean-Scocuzza, Pasty Spillers, MD  fluticasone (FLONASE) 50 MCG/ACT nasal spray Place into both nostrils daily as needed for allergies or rhinitis.    [provider]  Fluticasone-Salmeterol (ADVAIR) 250-50 MCG/DOSE AEPB Inhale 1 puff into the lungs 2 (two) times daily. Rinse mouth 06/10/18   McLean-Scocuzza, Pasty Spillers, MD  FLUZONE HIGH-DOSE 0.5 ML injection TO BE ADMINISTERED BY PHARMACIST FOR IMMUNIZATION 08/11/17   [provider]  ibuprofen (ADVIL,MOTRIN) 800 MG tablet Take 0.5 tablets (400 mg total) by mouth every 8 (eight) hours as needed.  07/19/16   Darci Current, MD  ipratropium-albuterol (DUONEB) 0.5-2.5 (3) MG/3ML SOLN Take 3 mLs by nebulization 3 (three) times daily as needed (cough, wheezing, sob). 05/13/18   McLean-Scocuzza, Pasty Spillers, MD  losartan (COZAAR) 25 MG tablet Take 1 tablet (25 mg total) by mouth daily. 05/13/18   McLean-Scocuzza, Pasty Spillers, MD  memantine (NAMENDA) 10 MG tablet Take 1 tablet (10 mg total) by mouth 2 (two) times daily. 12/04/17   McLean-Scocuzza, Pasty Spillers, MD  PREVNAR 13 SUSP injection TO BE ADMINISTERED BY PHARMACIST FOR IMMUNIZATION 08/11/17   [provider]    REVIEW OF SYSTEMS:  Review of Systems  Unable to perform ROS: Dementia     VITAL SIGNS:   Vitals:   09/04/18 2245 09/04/18 2300 09/04/18 2330 09/05/18 0000  BP:  136/64 (!) 144/67 139/78  Pulse: 64 63 64   Resp:      Temp:      TempSrc:      SpO2: 96% 94% 95%   Weight:       Wt Readings from Last 3 Encounters:  09/04/18 70 kg  07/01/18 69.9 kg  05/13/18 70.8 kg    PHYSICAL EXAMINATION:  Physical Exam  Vitals reviewed. Constitutional: She appears well-developed and well-nourished. No distress.  HENT:  Head: Normocephalic and atraumatic.  Mouth/Throat: Oropharynx is clear and moist.  Eyes: Pupils are equal, round, and reactive to light. Conjunctivae and EOM are normal. No scleral icterus.  Neck: Normal range of motion. Neck supple. No JVD present. No thyromegaly present.  Cardiovascular: Normal rate, regular rhythm and intact distal pulses. Exam reveals no gallop and no friction rub.  No murmur heard. Respiratory: She is in respiratory distress (mild). She has wheezes. She has no rales.  GI: Soft. Bowel sounds are normal. She exhibits no distension. There is no tenderness.  Musculoskeletal: Normal range of motion. She exhibits no edema.  No arthritis, no gout  Lymphadenopathy:    She has no cervical adenopathy.  Neurological: She is alert. No cranial nerve deficit.  No dysarthria, no aphasia  Skin: Skin is warm  and dry. No rash noted. No erythema.  Psychiatric: She has a normal mood and affect. Her behavior is normal. Judgment and thought content normal.    LABORATORY PANEL:   CBC Recent Labs  Lab 09/04/18 2156  WBC 12.2*  HGB 14.4  HCT 44.3  PLT 311   ------------------------------------------------------------------------------------------------------------------  Chemistries  Recent Labs  Lab 09/04/18 2156  NA 141  K 5.0  CL 106  CO2 27  GLUCOSE 150*  BUN 29*  CREATININE 1.37*  CALCIUM 9.4  AST 17  ALT 15  ALKPHOS 79  BILITOT  0.6   ------------------------------------------------------------------------------------------------------------------  Cardiac Enzymes No results for input(s): TROPONINI in the last 168 hours. ------------------------------------------------------------------------------------------------------------------  RADIOLOGY:  Dg Chest 2 View  Result Date: 09/04/2018 CLINICAL DATA:  Cough with nausea and weakness 1 week. EXAM: CHEST - 2 VIEW COMPARISON:  07/01/2018 FINDINGS: Lungs are adequately inflated without focal airspace consolidation or effusion. Mild stable cardiomegaly. Remainder of the exam is unchanged. IMPRESSION: No acute cardiopulmonary disease. Mild stable cardiomegaly. Electronically Signed   By: Elberta Fortis M.D.   On: 09/04/2018 22:00    EKG:   Orders placed or performed in visit on 09/04/18  . EKG 12-Lead    IMPRESSION AND PLAN:  Principal Problem:   COPD with acute exacerbation (HCC) -IV steroid, duo nebs, azithromycin, PRN supportive treatment, continue home meds Active Problems:   Essential hypertension -home dose antihypertensives   Dementia without behavioral disturbance (HCC) -continue home medications   CKD (chronic kidney disease) stage 3, GFR 30-59 ml/min (HCC) -at baseline, avoid nephrotoxins and monitor   HLD (hyperlipidemia) -Home dose antilipid  Chart review performed and case discussed with ED  provider. Labs, imaging and/or ECG reviewed by provider and discussed with patient/family. Management plans discussed with the patient and/or family.  DVT PROPHYLAXIS: SubQ lovenox   GI PROPHYLAXIS:  None  ADMISSION STATUS: Observation  CODE STATUS: Full Advance Directive Documentation     Most Recent Value  Type of Advance Directive  Healthcare Power of Attorney, Living will  Pre-existing out of facility DNR order (yellow form or pink MOST form)  -  "MOST" Form in Place?  -      TOTAL TIME TAKING CARE OF THIS PATIENT: 40 minutes.   Roschelle Calandra FIELDING 09/05/2018, 12:15 AM  [employer]  [phone]  CC: Primary care physician; McLean-Scocuzza, Pasty Spillers, MD  Note:  This document was prepared using Dragon voice recognition software and may include unintentional dictation errors.

## 2018-09-06 ENCOUNTER — Inpatient Hospital Stay: Payer: Medicare Other

## 2018-09-06 MED ORDER — BUDESONIDE 0.5 MG/2ML IN SUSP
0.5000 mg | Freq: Two times a day (BID) | RESPIRATORY_TRACT | Status: DC
  Administered 2018-09-06 – 2018-09-08 (×4): 0.5 mg via RESPIRATORY_TRACT
  Filled 2018-09-06 (×4): qty 2

## 2018-09-06 MED ORDER — METHYLPREDNISOLONE SODIUM SUCC 40 MG IJ SOLR
40.0000 mg | Freq: Two times a day (BID) | INTRAMUSCULAR | Status: DC
  Administered 2018-09-06 – 2018-09-08 (×4): 40 mg via INTRAVENOUS
  Filled 2018-09-06 (×4): qty 1

## 2018-09-06 MED ORDER — BENZONATATE 100 MG PO CAPS
100.0000 mg | ORAL_CAPSULE | Freq: Three times a day (TID) | ORAL | Status: DC | PRN
  Administered 2018-09-06 – 2018-09-07 (×3): 100 mg via ORAL
  Filled 2018-09-06 (×3): qty 1

## 2018-09-06 MED ORDER — IPRATROPIUM-ALBUTEROL 0.5-2.5 (3) MG/3ML IN SOLN
3.0000 mL | Freq: Four times a day (QID) | RESPIRATORY_TRACT | Status: DC
  Administered 2018-09-06 – 2018-09-07 (×4): 3 mL via RESPIRATORY_TRACT
  Filled 2018-09-06 (×4): qty 3

## 2018-09-06 NOTE — Progress Notes (Signed)
[facility] - [place] at [facility]   PATIENT NAME: Makayla [name]    MR#:  [id number]  DATE OF BIRTH:  24-May-1923  SUBJECTIVE: More wheezing today, more cough.  CHIEF COMPLAINT:   Chief Complaint  Patient presents with  . Cough  . Weakness    REVIEW OF SYSTEMS:   ROS CONSTITUTIONAL: No fever, fatigue or weakness.  EYES: No blurred or double vision.  EARS, NOSE, AND THROAT: No tinnitus or ear pain.  RESPIRATORY: Cough, more shortness of breath today.  Wheezing   CARDIOVASCULAR: No chest pain, orthopnea, edema.  GASTROINTESTINAL: No nausea, vomiting, diarrhea or abdominal pain.  GENITOURINARY: No dysuria, hematuria.  ENDOCRINE: No polyuria, nocturia,  HEMATOLOGY: No anemia, easy bruising or bleeding SKIN: No rash or lesion. MUSCULOSKELETAL: No joint pain or arthritis.   NEUROLOGIC: No tingling, numbness, weakness.  PSYCHIATRY: No anxiety or depression.   DRUG ALLERGIES:   Allergies  Allergen Reactions  . Latex     rash    VITALS:  Blood pressure 115/60, pulse 75, temperature 97.7 F (36.5 C), temperature source Oral, resp. rate 18, height 5\' 3"  (1.6 m), weight 66.5 kg, SpO2 90 %.  PHYSICAL EXAMINATION:  GENERAL:  84 y.o.-year-old patient lying in the bed with no acute distress.  EYES: Pupils equal, round, reactive to light and accommodation. No scleral icterus. Extraocular muscles intact.  HEENT: Head atraumatic, normocephalic. Oropharynx and nasopharynx clear.  NECK:  Supple, no jugular venous distention. No thyroid enlargement, no tenderness.  LUNGS: Has bilateral expiratory wheeze in all lung fields.   CARDIOVA SCULAR: S1, S2 normal. No murmurs, rubs, or gallops.  ABDOMEN: Soft, nontender, nondistended. Bowel sounds present. No organomegaly or mass.  EXTREMITIES: No pedal edema, cyanosis, or clubbing.  NEUROLOGIC: Cranial nerves II through XII are intact. Muscle strength 5/5 in all extremities. Sensation intact. Gait not checked.   PSYCHIATRIC: The patient is alert and oriented x 3.  SKIN: No obvious rash, lesion, or ulcer.    LABORATORY PANEL:   CBC Recent Labs  Lab 09/05/18 0556  WBC 10.9*  HGB 13.5  HCT 41.2  PLT 298   ------------------------------------------------------------------------------------------------------------------  Chemistries  Recent Labs  Lab 09/04/18 2156 09/05/18 0556  NA 141 140  K 5.0 4.8  CL 106 108  CO2 27 23  GLUCOSE 150* 191*  BUN 29* 28*  CREATININE 1.37* 1.24*  CALCIUM 9.4 9.0  AST 17  --   ALT 15  --   ALKPHOS 79  --   BILITOT 0.6  --    ------------------------------------------------------------------------------------------------------------------  Cardiac Enzymes No results for input(s): TROPONINI in the last 168 hours. ------------------------------------------------------------------------------------------------------------------  RADIOLOGY:  Dg Chest 2 View  Result Date: 09/04/2018 CLINICAL DATA:  Cough with nausea and weakness 1 week. EXAM: CHEST - 2 VIEW COMPARISON:  07/01/2018 FINDINGS: Lungs are adequately inflated without focal airspace consolidation or effusion. Mild stable cardiomegaly. Remainder of the exam is unchanged. IMPRESSION: No acute cardiopulmonary disease. Mild stable cardiomegaly. Electronically Signed   By: Elberta Fortis M.D.   On: 09/04/2018 22:00    EKG:   Orders placed or performed in visit on 09/04/18  . EKG 12-Lead  . EKG 12-Lead  . EKG 12-Lead    ASSESSMENT AND PLAN:   COPD exacerbation, patient clinically is worse today with more wheezing, more cough: Start on bronchodilators with Pulmicort, duo nebs, repeat portable chest x-ray again today, continue this treatment.  Unable to discharge because of worsening respiratory status, borderline O2 saturation 90% on room air.  DISCussed with caregiver at bedside.  She mentioned that patient is a DNR.  All the records are reviewed and case discussed with Care  Management/Social Workerr. Management plans discussed with the patient, family and they are in agreement.  CODE STATUS: DNR  TOTAL TIME TAKING CARE OF THIS PATIENT: minutes.   POSSIBLE D/C IN 1-2 DAYS, DEPENDING ON CLINICAL CONDITION.   Katha Hamming M.D on 09/06/2018 at 12:59 PM  Between 7am to 6pm - Pager - [phone]  After 6pm go to [email or web address] - password [email or web address]  [facility]  Office  [phone]  CC: Primary care physician; McLean-Scocuzza, Pasty Spillers, MD   Note: This dictation was prepared with Dragon dictation along with smaller phrase technology. Any transcriptional errors that result from this process are unintentional.

## 2018-09-06 NOTE — Progress Notes (Signed)
Oxygen sat dropped to 87% when walking to the BR.  Restarted on 2L [place]

## 2018-09-06 NOTE — Plan of Care (Signed)

## 2018-09-06 NOTE — Plan of Care (Signed)
  Problem: Education: Goal: Knowledge of General Education information will improve Description Including pain rating scale, medication(s)/side effects and non-pharmacologic comfort measures Outcome: Progressing   Problem: Clinical Measurements: Goal: Ability to maintain clinical measurements within normal limits will improve Outcome: Progressing Goal: Diagnostic test results will improve Outcome: Progressing   Problem: Pain Managment: Goal: General experience of comfort will improve Outcome: Progressing   

## 2018-09-07 LAB — CBC
HCT: 39.5 % (ref 36.0–46.0)
Hemoglobin: 13.1 g/dL (ref 12.0–15.0)
MCH: 30.6 pg (ref 26.0–34.0)
MCHC: 33.2 g/dL (ref 30.0–36.0)
MCV: 92.3 fL (ref 80.0–100.0)
NRBC: 0 % (ref 0.0–0.2)
PLATELETS: 311 10*3/uL (ref 150–400)
RBC: 4.28 MIL/uL (ref 3.87–5.11)
RDW: 13.6 % (ref 11.5–15.5)
WBC: 20.8 10*3/uL — ABNORMAL HIGH (ref 4.0–10.5)

## 2018-09-07 MED ORDER — IPRATROPIUM-ALBUTEROL 0.5-2.5 (3) MG/3ML IN SOLN
3.0000 mL | Freq: Three times a day (TID) | RESPIRATORY_TRACT | Status: DC
  Administered 2018-09-07 – 2018-09-08 (×2): 3 mL via RESPIRATORY_TRACT
  Filled 2018-09-07 (×2): qty 3

## 2018-09-07 NOTE — Progress Notes (Signed)
SATURATION QUALIFICATIONS: (This note is used to comply with regulatory documentation for home oxygen)  Patient Saturations on Room Air at Rest = 93%  Patient Saturations on Room Air while Ambulating = 88%  Patient Saturations on 2 Liters of oxygen while Ambulating = 95%  Please briefly explain why patient needs home oxygen: 

## 2018-09-07 NOTE — Progress Notes (Signed)
[facility] - [place] at [facility]   PATIENT NAME: Makayla [name]    MR#:  [id number]  DATE OF BIRTH:  [birth year]  SUBJECTIVE: Patient appears comfortable but still has wheezing, productive cough.  Spoke with patient's daughter, mentioned that she has cough always but recently has been having change in the cough.  She told me that her oxygen saturation at home at rest fluctuated between 84 to 90%.  Wondering if she requires oxygen at home as well.  CHIEF COMPLAINT:   Chief Complaint  Patient presents with  . Cough  . Weakness    REVIEW OF SYSTEMS:   ROS CONSTITUTIONAL: No fever, fatigue or weakness.  EYES: No blurred or double vision.  EARS, NOSE, AND THROAT: No tinnitus or ear pain.  RESPIRATORY:, Wheezing.,  Cough.  CARDIOVASCULAR: No chest pain, orthopnea, edema.  GASTROINTESTINAL: No nausea, vomiting, diarrhea or abdominal pain.  GENITOURINARY: No dysuria, hematuria.  ENDOCRINE: No polyuria, nocturia,  HEMATOLOGY: No anemia, easy bruising or bleeding SKIN: No rash or lesion. MUSCULOSKELETAL: No joint pain or arthritis.   NEUROLOGIC: No tingling, numbness, weakness.  PSYCHIATRY: No anxiety or depression.   DRUG ALLERGIES:   Allergies  Allergen Reactions  . Latex     rash    VITALS:  Blood pressure 121/68, pulse 86, temperature 98.4 F (36.9 C), temperature source Oral, resp. rate 18, height 5\' 3"  (1.6 m), weight 66.5 kg, SpO2 92 %.  PHYSICAL EXAMINATION:  GENERAL:  84 y.o.-year-old patient lying in the bed with no acute distress.  EYES: Pupils equal, round, reactive to light and accommodation. No scleral icterus. Extraocular muscles intact.  HEENT: Head atraumatic, normocephalic. Oropharynx and nasopharynx clear.  NECK:  Supple, no jugular venous distention. No thyroid enlargement, no tenderness.  LUNGS: Has bilateral expiratory wheeze in all lung fields.   CARDIOVA SCULAR: S1, S2 normal. No murmurs, rubs, or gallops.  ABDOMEN: Soft,  nontender, nondistended. Bowel sounds present. No organomegaly or mass.  EXTREMITIES: No pedal edema, cyanosis, or clubbing.  NEUROLOGIC: Cranial nerves II through XII are intact. Muscle strength 5/5 in all extremities. Sensation intact. Gait not checked.  PSYCHIATRIC: The patient is alert and oriented x 3.  SKIN: No obvious rash, lesion, or ulcer.    LABORATORY PANEL:   CBC Recent Labs  Lab 09/07/18 0426  WBC 20.8*  HGB 13.1  HCT 39.5  PLT 311   ------------------------------------------------------------------------------------------------------------------  Chemistries  Recent Labs  Lab 09/04/18 2156 09/05/18 0556  NA 141 140  K 5.0 4.8  CL 106 108  CO2 27 23  GLUCOSE 150* 191*  BUN 29* 28*  CREATININE 1.37* 1.24*  CALCIUM 9.4 9.0  AST 17  --   ALT 15  --   ALKPHOS 79  --   BILITOT 0.6  --    ------------------------------------------------------------------------------------------------------------------  Cardiac Enzymes No results for input(s): TROPONINI in the last 168 hours. ------------------------------------------------------------------------------------------------------------------  RADIOLOGY:  Dg Chest Port 1 View  Result Date: 09/06/2018 CLINICAL DATA:  Cough. EXAM: PORTABLE CHEST 1 VIEW COMPARISON:  Radiographs of September 04, 2018. FINDINGS: Stable cardiomediastinal silhouette. No pneumothorax or pleural effusion is noted. Both lungs are clear. The visualized skeletal structures are unremarkable. IMPRESSION: No active disease. Electronically Signed   By: Lupita Raider, M.D.   On: 09/06/2018 14:21    EKG:   Orders placed or performed in visit on 09/04/18  . EKG 12-Lead  . EKG 12-Lead  . EKG 12-Lead    ASSESSMENT AND PLAN:   COPD exacerbation,  chest x-ray repeated yesterday did not show pneumonia.  Patient is feeling little better today, continue bronchodilators, IV steroids, empiric antibiotics, possible discharge home with home oxygen  tomorrow.  Discussed the plan with patient's daughter.  Patient wants to go home today but I feel like she needs to stay at least 1 more day for IV steroids.  Pulmicort nebs, IV steroids, azithromycin, discharged home tomorrow.   Discussed with patient's daughter. All the records are reviewed and case discussed with Care Management/Social Workerr. Management plans discussed with the patient, family and they are in agreement.  CODE STATUS: full  TOTAL TIME TAKING CARE OF THIS PATIENT: minutes.   POSSIBLE D/C IN 1-2 DAYS, DEPENDING ON CLINICAL CONDITION.   Katha Hamming M.D on 09/07/2018 at 11:23 AM  Between 7am to 6pm - Pager - [phone]  After 6pm go to [email or web address] - password [email or web address]  [facility]  Office  [phone]  CC: Primary care physician; McLean-Scocuzza, Pasty Spillers, MD   Note: This dictation was prepared with Dragon dictation along with smaller phrase technology. Any transcriptional errors that result from this process are unintentional.

## 2018-09-08 MED ORDER — AZITHROMYCIN 250 MG PO TABS: ORAL_TABLET | ORAL | 0 refills | Status: DC

## 2018-09-08 MED ORDER — PREDNISONE 10 MG (21) PO TBPK: ORAL_TABLET | ORAL | 0 refills | Status: DC

## 2018-09-08 MED ORDER — AMOXICILLIN-POT CLAVULANATE 875-125 MG PO TABS: 1.0000 | ORAL_TABLET | Freq: Two times a day (BID) | ORAL | 0 refills | Status: AC

## 2018-09-08 MED ORDER — BUDESONIDE 0.25 MG/2ML IN SUSP: 0.2500 mg | Freq: Two times a day (BID) | RESPIRATORY_TRACT | 2 refills | Status: DC

## 2018-09-08 NOTE — Progress Notes (Signed)
Makayla [name] to be D/C'd Home per MD order.  Discussed prescriptions and follow up appointments with the patient. Prescriptions electronically submitted, medication list explained in detail. Pt and daughter verbalized understanding.  Allergies as of 09/08/2018      Reactions   Latex    rash      Medication List    TAKE these medications   albuterol 108 (90 Base) MCG/ACT inhaler Commonly known as:  PROVENTIL HFA;VENTOLIN HFA Inhale 1-2 puffs into the lungs every 6 (six) hours as needed for wheezing or shortness of breath (cough).   amoxicillin-clavulanate 875-125 MG tablet Commonly known as:  AUGMENTIN Take 1 tablet by mouth 2 (two) times daily for 7 days.   aspirin EC 81 MG tablet Take 81 mg by mouth daily.   budesonide 0.25 MG/2ML nebulizer solution Commonly known as:  PULMICORT Take 2 mLs (0.25 mg total) by nebulization 2 (two) times daily.   cetirizine 10 MG tablet Commonly known as:  ZYRTEC Take 1 tablet (10 mg total) by mouth daily.   donepezil 10 MG tablet Commonly known as:  ARICEPT Take 1 tablet (10 mg total) by mouth at bedtime.   fluticasone 50 MCG/ACT nasal spray Commonly known as:  FLONASE Place into both nostrils daily as needed for allergies or rhinitis.   Fluticasone-Salmeterol 250-50 MCG/DOSE Aepb Commonly known as:  ADVAIR Inhale 1 puff into the lungs 2 (two) times daily. Rinse mouth   FLUZONE HIGH-DOSE 0.5 ML injection Generic drug:  Influenza vac split quadrivalent PF TO BE ADMINISTERED BY PHARMACIST FOR IMMUNIZATION   ibuprofen 800 MG tablet Commonly known as:  ADVIL,MOTRIN Take 0.5 tablets (400 mg total) by mouth every 8 (eight) hours as needed.   ipratropium-albuterol 0.5-2.5 (3) MG/3ML Soln Commonly known as:  DUONEB Take 3 mLs by nebulization 3 (three) times daily as needed (cough, wheezing, sob).   losartan 25 MG tablet Commonly known as:  COZAAR Take 1 tablet (25 mg total) by mouth daily.   memantine 10 MG tablet Commonly known as:   NAMENDA Take 1 tablet (10 mg total) by mouth 2 (two) times daily.   predniSONE 10 MG (21) Tbpk tablet Commonly known as:  STERAPRED UNI-PAK 21 TAB Taper by 10 mg daily   PREVNAR 13 Susp injection Generic drug:  pneumococcal 13-valent conjugate vaccine TO BE ADMINISTERED BY PHARMACIST FOR IMMUNIZATION            Durable Medical Equipment  (From admission, onward)         Start     Ordered   09/08/18 0912  For home use only DME Hospital bed  Once    Question Answer Comment  The above medical condition requires: Patient requires the ability to reposition frequently   Head must be elevated greater than: 45 degrees   Bed type Semi-electric      09/08/18 0911   09/08/18 0902  For home use only DME oxygen  Once    Question Answer Comment  Mode or (Route) Nasal cannula   Liters per Minute 2   Frequency Continuous (stationary and portable oxygen unit needed)   Oxygen conserving device Yes   Oxygen delivery system Gas      09/08/18 0901          Vitals:   09/08/18 0805 09/08/18 0837  BP: (!) 152/87   Pulse: 71   Resp: 18   Temp: 97.8 F (36.6 C)   SpO2: 94% 93%    Skin clean, dry and intact without evidence of  skin break down, no evidence of skin tears noted. IV catheter discontinued intact. Site without signs and symptoms of complications. Dressing and pressure applied. Pt denies pain at this time. No complaints noted. Personal o2 tank delivered to bedside  An After Visit Summary was printed and given to the patient. Patient escorted via WC, and D/C home via private auto.  Makayla [name] Makayla [name]

## 2018-09-08 NOTE — Care Management Note (Signed)
Case Management Note  Patient Details  Name: Makayla [name] MRN: [id number] Date of Birth: Mar 24, 1923  Subjective/Objective:   Patient to be discharged per MD order. Patient has orders in for home O2 and DME hospital bed. Qualifying sats are in. Patient will need hospital bed for [facility] elevation above 30 degrees per MD. Patient lives with daughter who care for patient and has hired private care services to assist. She is not interested in home health. DME obtained from [facility]. O2 to be delivered today and likely hospital bed delivery to the home later today.                Action/Plan:   Expected Discharge Date:  09/08/18               Expected Discharge Plan:  Home/Self Care  In-House Referral:     Discharge planning Services  CM Consult  Post Acute Care Choice:  Durable Medical Equipment Choice offered to:     DME Arranged:  Hospital bed, Oxygen DME Agency:  [facility]  HH Arranged:    [facility] Agency:     Status of Service:  Completed, signed off  If discussed at Long Length of Stay Meetings, dates discussed:    Additional Comments:  Makayla Areola [name], RN 09/08/2018, 10:13 AM

## 2018-09-08 NOTE — Care Management (Signed)
Patient has COPD and chronic emphysema which requires head of bed elevation to be positioned in ways that are not feasible with a normal bed. Patient has attempted to sleep with a wedge under her mattress and uses multiple pillows for elevation but cannot meet the desired elevation degree that is necessary. Head of bed must be at least 30 degrees or the patient will become shirt of breath and oxygen saturations will decrease. COPD diagnosis requires changes frequent changes in body position to assist in breathing and appropriate lung capacity which cannot be achieved with a normal bed.

## 2018-09-09 ENCOUNTER — Telehealth: Payer: Self-pay

## 2018-09-09 NOTE — Telephone Encounter (Signed)
Unable to reach patient or daughter. Left message to return my call regarding transitional care management. Appointment schedule with primary care provider on 09/13/18 at 10:00. Will follow.

## 2018-09-10 NOTE — Telephone Encounter (Signed)
Transition Care Management Follow-up Telephone Call   Date discharged?  09/08/18.  Information received from daughter [name]). HIPAA compliant.    How have you been since you were released from the hospital? Doing much better! Sleeping well. Great appetite. Hardly coughing. Oxygen in use at 2L when active,and at night. Breathing treatments TID. Hospital bed in use with head raised. Plans to use walker and/or scooter when ambulating outside of the home.   Do you understand why you were in the hospital? Cough, COPD exacerbation.   Do you understand the discharge instructions? Use oxygen, nebulizer, inhalers prn.Increase activity as tolerated.    Where were you discharged to? Home with daughter.   Items Reviewed:  Medications reviewed: Taking all medications as directed.  Prednisone taper without issues.   Allergies reviewed: Yes. None new.   Dietary changes reviewed: Heart healthy diet.  Eats 2 good meals daily.   Referrals reviewed: Yes.    Functional Questionnaire:   Activities of Daily Living (ADLs):   She states they are independent in the following self feeds. States they require assistance with the following: Daughter assists with meal prep, bathing/dressing, toileting.    Any transportation issues/concerns?:  None at this time.   Any patient concerns?  None at this time.    Confirmed importance and date/time of follow-up visits scheduled Yes, appointment scheduled 09/13/18 at 10:00.  Provider Appointment booked with Dr. Judie Grieve (pcp).  Confirmed with patient if condition begins to worsen call PCP or go to the ER.  Patient was given the office number and encouraged to call back with question or concerns.  : Yes.

## 2018-09-11 NOTE — Discharge Summary (Signed)
Makayla [name], is a 84 y.o. female  DOB 1923-08-10  MRN [id number].  Admission date:  09/04/2018  Admitting Physician  Oralia Manis, MD  Discharge Date:  09/08/2018   Primary MD  McLean-Scocuzza, Pasty Spillers, MD  Recommendations for primary care physician for things to follow:   Follow-up with PCP in 1 week   Admission Diagnosis  Cough [R05] COPD exacerbation (HCC) [J44.1]   Discharge Diagnosis  Cough [R05] COPD exacerbation (HCC) [J44.1]    Principal Problem:   COPD with acute exacerbation (HCC) Active Problems:   Essential hypertension   Dementia without behavioral disturbance (HCC)   CKD (chronic kidney disease) stage 3, GFR 30-59 ml/min (HCC)   HLD (hyperlipidemia)      Past Medical History:  Diagnosis Date  . Allergy   . Alzheimer's dementia (HCC)    CT head 08/25/15 mild cortical atrophy and chronic small vessel ischemic change. atheroscloerosis. paranasal sinus disease   . Anosmia   . Asthma   . Chicken pox   . COPD (chronic obstructive pulmonary disease) (HCC)   . Emphysema of lung (HCC)    per daughter never smoker exposed 2nd have via husband  . Hard of hearing    since childhood  . Hypertension   . Nasal polyps    surgery x 15 years ago and in ~2013 as of 10/08/17  . Syncope    03/2017 thought 2/2 diuretic stopped in [facility]   . UTI (urinary tract infection)     Past Surgical History:  Procedure Laterality Date  . ABDOMINAL HYSTERECTOMY     ? year ? reason ; partial   . BREAST BIOPSY     ? year   . nasal polyps removed         History of present illness and  Hospital Course:     Kindly see H&P for history of present illness and admission details, please review complete Labs, Consult reports and Test reports for all details in brief  HPI  from the history and physical done on the  day of admission 84 year old female patient admitted for cough, weakness and COPD exacerbation   Hospital Course  #1. COPD exacerbation, received IV steroids, bronchodilators, empiric azithromycin, discharged home with tapering course of steroids, Pulmicort nebs, Augmentin for 7 days. 2.  Alzheimer's dementia, patient has caregivers at home, daughter also takes care of her. 3.  Essential hypertension: Controlled. Patient is hypoxic with O2 sats 93% on room air at rest, 88%.  2 L Also arranged hospital bed.   Discharge Condition:    Follow UP  Follow-up Information    McLean-Scocuzza, Pasty Spillers, MD. Schedule an appointment as soon as possible for a visit in 1 week(s).   Specialty:  Internal Medicine Contact information: [address] [place] [address] [phone]             Discharge Instructions  and  Discharge Medications      Allergies as of 09/08/2018      Reactions   Latex    rash      Medication List    TAKE these medications   albuterol 108 (90 Base) MCG/ACT inhaler Commonly known as:  PROVENTIL HFA;VENTOLIN HFA Inhale 1-2 puffs into the lungs every 6 (six) hours as needed for wheezing or shortness of breath (cough).   amoxicillin-clavulanate 875-125 MG tablet Commonly known as:  AUGMENTIN Take 1 tablet by mouth 2 (two) times daily for 7 days.   aspirin EC 81 MG tablet Take  81 mg by mouth daily.   budesonide 0.25 MG/2ML nebulizer solution Commonly known as:  PULMICORT Take 2 mLs (0.25 mg total) by nebulization 2 (two) times daily.   cetirizine 10 MG tablet Commonly known as:  ZYRTEC Take 1 tablet (10 mg total) by mouth daily.   donepezil 10 MG tablet Commonly known as:  ARICEPT Take 1 tablet (10 mg total) by mouth at bedtime.   fluticasone 50 MCG/ACT nasal spray Commonly known as:  FLONASE Place into both nostrils daily as needed for allergies or rhinitis.   Fluticasone-Salmeterol 250-50 MCG/DOSE Aepb Commonly known as:   ADVAIR Inhale 1 puff into the lungs 2 (two) times daily. Rinse mouth   FLUZONE HIGH-DOSE 0.5 ML injection Generic drug:  Influenza vac split quadrivalent PF TO BE ADMINISTERED BY PHARMACIST FOR IMMUNIZATION   ibuprofen 800 MG tablet Commonly known as:  ADVIL,MOTRIN Take 0.5 tablets (400 mg total) by mouth every 8 (eight) hours as needed.   ipratropium-albuterol 0.5-2.5 (3) MG/3ML Soln Commonly known as:  DUONEB Take 3 mLs by nebulization 3 (three) times daily as needed (cough, wheezing, sob).   losartan 25 MG tablet Commonly known as:  COZAAR Take 1 tablet (25 mg total) by mouth daily.   memantine 10 MG tablet Commonly known as:  NAMENDA Take 1 tablet (10 mg total) by mouth 2 (two) times daily.   predniSONE 10 MG (21) Tbpk tablet Commonly known as:  STERAPRED UNI-PAK 21 TAB Taper by 10 mg daily   PREVNAR 13 Susp injection Generic drug:  pneumococcal 13-valent conjugate vaccine TO BE ADMINISTERED BY PHARMACIST FOR IMMUNIZATION         Diet and Activity recommendation: See Discharge Instructions above   Consults obtained - none   Major procedures and Radiology Reports - PLEASE review detailed and final reports for all details, in brief -      Dg Chest 2 View  Result Date: 09/04/2018 CLINICAL DATA:  Cough with nausea and weakness 1 week. EXAM: CHEST - 2 VIEW COMPARISON:  07/01/2018 FINDINGS: Lungs are adequately inflated without focal airspace consolidation or effusion. Mild stable cardiomegaly. Remainder of the exam is unchanged. IMPRESSION: No acute cardiopulmonary disease. Mild stable cardiomegaly. Electronically Signed   By: Elberta Fortis M.D.   On: 09/04/2018 22:00   Dg Chest Port 1 View  Result Date: 09/06/2018 CLINICAL DATA:  Cough. EXAM: PORTABLE CHEST 1 VIEW COMPARISON:  Radiographs of September 04, 2018. FINDINGS: Stable cardiomediastinal silhouette. No pneumothorax or pleural effusion is noted. Both lungs are clear. The visualized skeletal structures are  unremarkable. IMPRESSION: No active disease. Electronically Signed   By: Lupita Raider, M.D.   On: 09/06/2018 14:21    Micro Results    No results found for this or any previous visit (from the past 240 hour(s)).     Today   Subjective:   [name] today has no headache,no chest abdominal pain,no new weakness tingling or numbness, feels much better wants to go home today.   Objective:   Blood pressure (!) 152/87, pulse 71, temperature 97.8 F (36.6 C), temperature source Oral, resp. rate 18, height 5\' 3"  (1.6 m), weight 66.5 kg, SpO2 93 %.  No intake or output data in the 24 hours ending 09/11/18 1319  Exam Awake Alert, Oriented x 3, No new F.N deficits, Normal affect [place].AT,PERRAL Supple Neck,No JVD, No cervical lymphadenopathy appriciated.  Symmetrical Chest wall movement, Good air movement bilaterally, CTAB RRR,No Gallops,Rubs or new Murmurs, No Parasternal Heave +ve B.Sounds, Abd Soft,  Non tender, No organomegaly appriciated, No rebound -guarding or rigidity. No Cyanosis, Clubbing or edema, No new Rash or bruise  Data Review   CBC w Diff:  Lab Results  Component Value Date   WBC 20.8 (H) 09/07/2018   HGB 13.1 09/07/2018   HCT 39.5 09/07/2018   PLT 311 09/07/2018   LYMPHOPCT 13 09/04/2018   MONOPCT 6 09/04/2018   EOSPCT 5 09/04/2018   BASOPCT 1 09/04/2018    CMP:  Lab Results  Component Value Date   NA 140 09/05/2018   K 4.8 09/05/2018   CL 108 09/05/2018   CO2 23 09/05/2018   BUN 28 (H) 09/05/2018   CREATININE 1.24 (H) 09/05/2018   PROT 7.0 09/04/2018   ALBUMIN 3.6 09/04/2018   BILITOT 0.6 09/04/2018   ALKPHOS 79 09/04/2018   AST 17 09/04/2018   ALT 15 09/04/2018  .   Total Time in preparing paper work, data evaluation and todays exam - 35 minutes  Katha Hamming M.D on 09/08/2018 at 1:19 PM    Note: This dictation was prepared with Dragon dictation along with smaller phrase technology. Any transcriptional errors that result from this  process are unintentional.

## 2018-09-13 ENCOUNTER — Ambulatory Visit: Payer: Medicare Other | Admitting: Internal Medicine

## 2018-09-13 ENCOUNTER — Encounter: Payer: Self-pay | Admitting: Internal Medicine

## 2018-09-13 VITALS — BP 122/58 | HR 64 | Temp 98.0°F | Resp 16 | Ht 67.75 in | Wt 150.5 lb

## 2018-09-13 DIAGNOSIS — J441 Chronic obstructive pulmonary disease with (acute) exacerbation: Secondary | ICD-10-CM

## 2018-09-13 NOTE — Patient Instructions (Signed)
Chronic Obstructive Pulmonary Disease Exacerbation Chronic obstructive pulmonary disease (COPD) is a long-term (chronic) condition that affects the lungs. COPD is a general term that can be used to describe many different lung problems that cause lung swelling (inflammation) and limit airflow, including chronic bronchitis and emphysema. COPD exacerbations are episodes when breathing symptoms become much worse and require extra treatment. COPD exacerbations are usually caused by infections. Without treatment, COPD exacerbations can be severe and even life threatening. Frequent COPD exacerbations can cause further damage to the lungs. What are the causes? This condition may be caused by:  Respiratory infections, including viral and bacterial infections.  Exposure to smoke.  Exposure to air pollution, chemical fumes, or dust.  Things that give you an allergic reaction (allergens).  Not taking your usual COPD medicines as directed.  Underlying medical problems, such as congestive heart failure or infections not involving the lungs.  In many cases, the cause (trigger) of this condition is not known. What increases the risk? The following factors may make you more likely to develop this condition:  Smoking cigarettes.  Old age.  Frequent prior COPD exacerbations.  What are the signs or symptoms? Symptoms of this condition include:  Increased coughing.  Increased production of mucus from your lungs (sputum).  Increased wheezing.  Increased shortness of breath.  Rapid or labored breathing.  Chest tightness.  Less energy than usual.  Sleep disruption from symptoms.  Confusion or increased sleepiness.  Often these symptoms happen or get worse even with the use of medicines. How is this diagnosed? This condition is diagnosed based on:  Your medical history.  A physical exam.  You may also have tests, including:  A chest X-ray.  Blood tests.  Lung (pulmonary)  function tests.  How is this treated? Treatment for this condition depends on the severity and cause of the symptoms. You may need to be admitted to a hospital for treatment. Some of the treatments commonly used to treat COPD exacerbations are:  Antibiotic medicines. These may be used for severe exacerbations caused by a lung infection, such as pneumonia.  Bronchodilators. These are inhaled medicines that expand the air passages and allow increased airflow.  Steroid medicines. These act to reduce inflammation in the airways. They may be given with an inhaler, taken by mouth, or given through an IV tube inserted into one of your veins.  Supplemental oxygen therapy.  Airway clearing techniques, such as noninvasive ventilation (NIV) and positive expiratory pressure (PEP). These provide respiratory support through a mask or other noninvasive device. An example of this would be using a continuous positive airway pressure (CPAP) machine to improve delivery of oxygen into your lungs.  Follow these instructions at home: Medicines  Take over-the-counter and prescription medicines only as told by your health care provider. It is important to use correct technique with inhaled medicines.  If you were prescribed an antibiotic medicine or oral steroid, take it as told by your health care provider. Do not stop taking the medicine even if you start to feel better. Lifestyle  Eat a healthy diet.  Exercise regularly.  Get plenty of sleep.  Avoid exposure to all substances that irritate the airway, especially to tobacco smoke.  Wash your hands often with soap and water to reduce the risk of infection. If soap and water are not available, use hand sanitizer.  During flu season, avoid enclosed spaces that are crowded with people. General instructions  Drink enough fluid to keep your urine clear or pale yellow (  unless you have a medical condition that requires fluid restriction).  Use a cool mist  vaporizer. This humidifies the air and makes it easier for you to clear your chest when you cough.  If you have a home nebulizer and oxygen, continue to use them as told by your health care provider.  Keep all follow-up visits as told by your health care provider. This is important. How is this prevented?  Stay up-to-date on pneumococcal and influenza (flu) vaccines. A flu shot is recommended every year to help prevent exacerbations.  Do not use any products that contain nicotine or tobacco, such as cigarettes and e-cigarettes. Quitting smoking is very important in preventing COPD from getting worse and in preventing exacerbations from happening as often. If you need help quitting, ask your health care provider.  Follow all instructions for pulmonary rehabilitation after a recent exacerbation. This can help prevent future exacerbations.  Work with your health care provider to develop and follow an action plan. This tells you what steps to take when you experience certain symptoms. Contact a health care provider if:  You have a worsening of your regular COPD symptoms. Get help right away if:  You have worsening shortness of breath, even when resting.  You have trouble talking.  You have severe chest pain.  You cough up blood.  You have a fever.  You have weakness, vomit repeatedly, or faint.  You feel confused.  You are not able to sleep because of your symptoms.  You have trouble doing daily activities. Summary  COPD exacerbations are episodes when breathing symptoms become much worse and require extra treatment above your normal treatment.  Exacerbations can be severe and even life threatening. Frequent COPD exacerbations can cause further damage to your lungs.  COPD exacerbations are usually triggered by infections such as the flu, colds, and even pneumonia.  Treatment for this condition depends on the severity and cause of the symptoms. You may need to be admitted to a  hospital for treatment.  Quitting smoking is very important to prevent COPD from getting worse and to prevent exacerbations from happening as often. This information is not intended to replace advice given to you by your health care provider. Make sure you discuss any questions you have with your health care provider. Document Released: 08/20/2007 Document Revised: 11/27/2016 Document Reviewed: 11/27/2016 Elsevier Interactive Patient Education  2018 [employer].  Chronic Obstructive Pulmonary Disease Chronic obstructive pulmonary disease (COPD) is a long-term (chronic) condition that affects the lungs. COPD is a general term that can be used to describe many different lung problems that cause lung swelling (inflammation) and limit airflow, including chronic bronchitis and emphysema. If you have COPD, your lung function will probably never return to normal. In most cases, it gets worse over time. However, there are steps you can take to slow the progression of the disease and improve your quality of life. What are the causes? This condition may be caused by:  Smoking. This is the most common cause.  Certain genes passed down through families.  What increases the risk? The following factors may make you more likely to develop this condition:  Secondhand smoke from cigarettes, pipes, or cigars.  Exposure to chemicals and other irritants such as fumes and dust in the work environment.  Chronic lung conditions or infections.  What are the signs or symptoms? Symptoms of this condition include:  Shortness of breath, especially during physical activity.  Chronic cough with a large amount of thick mucus.  Sometimes the cough may not have any mucus (dry cough).  Wheezing.  Rapid breaths.  Gray or bluish discoloration (cyanosis) of the skin, especially in your fingers, toes, or lips.  Feeling tired (fatigue).  Weight loss.  Chest tightness.  Frequent infections.  Episodes when  breathing symptoms become much worse (exacerbations).  Swelling in the ankles, feet, or legs. This may occur in later stages of the disease.  How is this diagnosed? This condition is diagnosed based on:  Your medical history.  A physical exam.  You may also have tests, including:  Lung (pulmonary) function tests. This may include a spirometry test, which measures your ability to exhale properly.  Chest X-ray.  CT scan.  Blood tests.  How is this treated? This condition may be treated with:  Medicines. These may include inhaled rescue medicines to treat acute exacerbations as well as long-term, or maintenance, medicines to prevent flare-ups of COPD. ? Bronchodilators help treat COPD by dilating the airways to allow increased airflow and make your breathing more comfortable. ? Steroids can reduce airway inflammation and help prevent exacerbations.  Smoking cessation. If you smoke, your health care provider may ask you to quit, and may also recommend therapy or replacement products to help you quit.  Pulmonary rehabilitation. This may involve working with a team of health care providers and specialists, such as respiratory, occupational, and physical therapists.  Exercise and physical activity. These are beneficial for nearly all people with COPD.  Nutrition therapy to gain weight, if you are underweight.  Oxygen. Supplemental oxygen therapy is only helpful if you have a low oxygen level in your blood (hypoxemia).  Lung surgery or transplant.  Palliative care. This is to help people with COPD feel comfortable when treatment is no longer working.  Follow these instructions at home: Medicines  Take over-the-counter and prescription medicines (inhaled or pills) only as told by your health care provider.  Talk to your health care provider before taking any cough or allergy medicines. You may need to avoid certain medicines that dry out your airways. Lifestyle  If you are a  smoker, the most important thing that you can do is to stop smoking. Do not use any products that contain nicotine or tobacco, such as cigarettes and e-cigarettes. If you need help quitting, ask your health care provider. Continuing to smoke will cause the disease to progress faster.  Avoid exposure to things that irritate your lungs, such as smoke, chemicals, and fumes.  Stay active, but balance activity with periods of rest. Exercise and physical activity will help you maintain your ability to do things you want to do.  Learn and use relaxation techniques to manage stress and to control your breathing.  Get the right amount of sleep and get quality sleep. Most adults need 7 or more hours per night.  Eat healthy foods. Eating smaller, more frequent meals and resting before meals may help you maintain your strength. Controlled breathing Learn and use controlled breathing techniques as directed by your health care provider. Controlled breathing techniques include:  Pursed lip breathing. Start by breathing in (inhaling) through your nose for 1 second. Then, purse your lips as if you were going to whistle and breathe out (exhale) through the pursed lips for 2 seconds.  Diaphragmatic breathing. Start by putting one hand on your abdomen just above your waist. Inhale slowly through your nose. The hand on your abdomen should move out. Then purse your lips and exhale slowly. You should be able to  feel the hand on your abdomen moving in as you exhale.  Controlled coughing Learn and use controlled coughing to clear mucus from your lungs. Controlled coughing is a series of short, progressive coughs. The steps of controlled coughing are: 1. Lean your head slightly forward. 2. Breathe in deeply using diaphragmatic breathing. 3. Try to hold your breath for 3 seconds. 4. Keep your mouth slightly open while coughing twice. 5. Spit any mucus out into a tissue. 6. Rest and repeat the steps once or twice as  needed.  General instructions  Make sure you receive all the vaccines that your health care provider recommends, especially the pneumococcal and influenza vaccines. Preventing infection and hospitalization is very important when you have COPD.  Use oxygen therapy and pulmonary rehabilitation if directed to by your health care provider. If you require home oxygen therapy, ask your health care provider whether you should purchase a pulse oximeter to measure your oxygen level at home.  Work with your health care provider to develop a COPD action plan. This will help you know what steps to take if your condition gets worse.  Keep other chronic health conditions under control as told by your health care provider.  Avoid extreme temperature and humidity changes.  Avoid contact with people who have an illness that spreads from person to person (is contagious), such as viral infections or pneumonia.  Keep all follow-up visits as told by your health care provider. This is important. Contact a health care provider if:  You are coughing up more mucus than usual.  There is a change in the color or thickness of your mucus.  Your breathing is more labored than usual.  Your breathing is faster than usual.  You have difficulty sleeping.  You need to use your rescue medicines or inhalers more often than expected.  You have trouble doing routine activities such as getting dressed or walking around the house. Get help right away if:  You have shortness of breath while you are resting.  You have shortness of breath that prevents you from: ? Being able to talk. ? Performing your usual physical activities.  You have chest pain lasting longer than 5 minutes.  Your skin color is more blue (cyanotic) than usual.  You measure low oxygen saturations for longer than 5 minutes with a pulse oximeter.  You have a fever.  You feel too tired to breathe normally. Summary  Chronic obstructive  pulmonary disease (COPD) is a long-term (chronic) condition that affects the lungs.  Your lung function will probably never return to normal. In most cases, it gets worse over time. However, there are steps you can take to slow the progression of the disease and improve your quality of life.  Treatment for COPD may include taking medicines, quitting smoking, pulmonary rehabilitation, and changes to diet and exercise. As the disease progresses, you may need oxygen therapy, a lung transplant, or palliative care.  To help manage your condition, do not smoke, avoid exposure to things that irritate your lungs, stay up to date on all vaccines, and follow your health care provider's instructions for taking medicines. This information is not intended to replace advice given to you by your health care provider. Make sure you discuss any questions you have with your health care provider. Document Released: 08/02/2005 Document Revised: 11/27/2016 Document Reviewed: 11/27/2016 Elsevier Interactive Patient Education  [employer].

## 2018-09-13 NOTE — Progress Notes (Addendum)
Chief Complaint  Patient presents with  . Hospitalization Follow-up   HFU for COPD exacerbation  1. On 2L 02 O2 desat 88 in hospital no sob, no wheezing, cough at night improved no fever CXR neg for pneumonia. Daughter thinks she had URI she will complete 1-2 more days of steroid taper and augmentin daughter wants to use duoneb and pulmicort neg as advair is to hard to use    Review of Systems  Constitutional: Positive for weight loss.  HENT: Negative for hearing loss.   Eyes: Negative for blurred vision.  Respiratory: Negative for cough and wheezing.   Cardiovascular: Negative for chest pain.  Skin: Negative for rash.  Neurological: Negative for headaches.  Psychiatric/Behavioral: Positive for memory loss. Negative for depression.   Past Medical History:  Diagnosis Date  . Allergy   . Alzheimer's dementia ([place])    CT head 08/25/15 mild cortical atrophy and chronic small vessel ischemic change. atheroscloerosis. paranasal sinus disease   . Anosmia   . Asthma   . Chicken pox   . COPD (chronic obstructive pulmonary disease) ([place])   . Emphysema of lung ([place])    per daughter never smoker exposed 2nd have via husband  . Hard of hearing    since childhood  . Hypertension   . Nasal polyps    surgery x 15 years ago and in ~2013 as of 10/08/17  . Syncope    03/2017 thought 2/2 diuretic stopped in [facility]   . UTI (urinary tract infection)    Past Surgical History:  Procedure Laterality Date  . ABDOMINAL HYSTERECTOMY     ? year ? reason ; partial   . BREAST BIOPSY     ? year   . nasal polyps removed     Family History  Problem Relation Age of Onset  . Cancer Mother        ? type  . Other Mother        scarlett fever, skin ulcer, died of complications after MVA   . Arthritis Mother   . Alcohol abuse Paternal Uncle   . Arthritis Father    Social History   Socioeconomic History  . Marital status: Widowed    Spouse name: Not on file  . Number of children: Not on file   . Years of education: Not on file  . Highest education level: Not on file  Occupational History  . Not on file  Social Needs  . Financial resource strain: Not on file  . Food insecurity:    Worry: Not on file    Inability: Not on file  . Transportation needs:    Medical: Not on file    Non-medical: Not on file  Tobacco Use  . Smoking status: Former Research scientist (life sciences)  . Smokeless tobacco: Never Used  Substance and Sexual Activity  . Alcohol use: No  . Drug use: No  . Sexual activity: Never  Lifestyle  . Physical activity:    Days per week: Not on file    Minutes per session: Not on file  . Stress: Not on file  Relationships  . Social connections:    Talks on phone: Not on file    Gets together: Not on file    Attends religious service: Not on file    Active member of club or organization: Not on file    Attends meetings of clubs or organizations: Not on file    Relationship status: Not on file  . Intimate partner violence:  Fear of current or ex partner: Not on file    Emotionally abused: Not on file    Physically abused: Not on file    Forced sexual activity: Not on file  Other Topics Concern  . 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 albuterol (PROVENTIL HFA;VENTOLIN HFA) 108 (90 Base) MCG/ACT inhaler Inhale 1-2 puffs into the lungs every 6 (six) hours as needed for wheezing or shortness of breath (cough).  [place] amoxicillin-clavulanate (AUGMENTIN) 875-125 MG tablet Take 1 tablet by mouth 2 (two) times daily for 7 days.  [place] aspirin EC 81 MG tablet Take 81 mg by mouth daily.  . budesonide (PULMICORT) 0.25 MG/2ML nebulizer solution Take 2 mLs (0.25 mg total) by nebulization 2 (two) times daily.  . cetirizine (ZYRTEC) 10 MG tablet Take 1 tablet (10 mg total) by  mouth daily.  [place] donepezil (ARICEPT) 10 MG tablet Take 1 tablet (10 mg total) by mouth at bedtime.  . Fluticasone-Salmeterol (ADVAIR) 250-50 MCG/DOSE AEPB Inhale 1 puff into the lungs 2 (two) times daily. Rinse mouth  . FLUZONE HIGH-DOSE 0.5 ML injection TO BE ADMINISTERED BY PHARMACIST FOR IMMUNIZATION  . ipratropium-albuterol (DUONEB) 0.5-2.5 (3) MG/3ML SOLN Take 3 mLs by nebulization 3 (three) times daily as needed (cough, wheezing, sob).  [place] losartan (COZAAR) 25 MG tablet Take 1 tablet (25 mg total) by mouth daily.  . memantine (NAMENDA) 10 MG tablet Take 1 tablet (10 mg total) by mouth 2 (two) times daily.  . predniSONE (STERAPRED UNI-PAK 21 TAB) 10 MG (21) TBPK tablet Taper by 10 mg daily  . PREVNAR 13 SUSP injection TO BE ADMINISTERED BY PHARMACIST FOR IMMUNIZATION   Allergies  Allergen Reactions  . Latex     rash   Recent Results (from the past 2160 hour(s))  Comprehensive metabolic panel     Status: Abnormal   Collection Time: 09/04/18  9:56 PM  Result Value Ref Range   Sodium 141 135 - 145 mmol/L   Potassium 5.0 3.5 - 5.1 mmol/L   Chloride 106 98 - 111 mmol/L   CO2 27 22 - 32 mmol/L   Glucose, Bld 150 (H) 70 - 99 mg/dL   BUN 29 (H) 8 - 23 mg/dL   Creatinine, Ser 1.37 (H) 0.44 - 1.00 mg/dL   Calcium 9.4 8.9 - 10.3 mg/dL   Total Protein 7.0 6.5 - 8.1 g/dL   Albumin 3.6 3.5 - 5.0 g/dL   AST 17 15 - 41 U/L   ALT 15 0 - 44 U/L   Alkaline Phosphatase 79 38 - 126 U/L   Total Bilirubin 0.6 0.3 - 1.2 mg/dL   GFR calc non Af Amer 32 (L) >60 mL/min   GFR calc Af Amer 37 (L) >60 mL/min    Comment: (NOTE) The eGFR has been calculated using the CKD EPI equation. This calculation has not been validated in all clinical situations. eGFR's persistently <60 mL/min signify possible Chronic Kidney Disease.    Anion gap 8 5 - 15    Comment: Performed at [facility], [address] [place], [place] [address]  CBC with Differential     Status: Abnormal   Collection Time:  09/04/18  9:56 PM  Result Value Ref Range   WBC 12.2 (H) 4.0 - 10.5 K/uL   RBC 4.70 3.87 - 5.11 MIL/uL   Hemoglobin 14.4 12.0 - 15.0 g/dL   HCT 44.3 36.0 - 46.0 %   MCV 94.3 80.0 - 100.0 fL   MCH 30.6 26.0 - 34.0 pg   MCHC 32.5 30.0 - 36.0 g/dL   RDW 13.4 11.5 - 15.5 %   Platelets 311 150 - 400 K/uL   nRBC 0.0 0.0 - 0.2 %   Neutrophils Relative % 74 %   Neutro Abs 9.2 (H) 1.7 - 7.7 K/uL   Lymphocytes Relative 13 %   Lymphs Abs 1.6 0.7 - 4.0 K/uL   Monocytes Relative 6 %   Monocytes Absolute 0.8 0.1 - 1.0 K/uL   Eosinophils Relative 5 %   Eosinophils Absolute 0.6 (H) 0.0 - 0.5 K/uL   Basophils Relative 1 %   Basophils Absolute 0.1 0.0 - 0.1 K/uL   Immature Granulocytes 1 %   Abs Immature Granulocytes 0.09 (H) 0.00 - 0.07 K/uL    Comment: Performed at [facility], [address] [place], [place] [address]  Basic metabolic panel     Status: Abnormal   Collection Time: 09/05/18  5:56 AM  Result Value Ref Range   Sodium 140 135 - 145 mmol/L   Potassium 4.8 3.5 - 5.1 mmol/L   Chloride 108 98 - 111 mmol/L   CO2 23 22 - 32 mmol/L   Glucose, Bld 191 (H) 70 - 99 mg/dL   BUN 28 (H) 8 - 23 mg/dL   Creatinine, Ser 1.24 (H) 0.44 - 1.00 mg/dL   Calcium 9.0 8.9 - 10.3 mg/dL   GFR calc non Af Amer 36 (L) >60 mL/min   GFR calc Af Amer 42 (L) >60 mL/min    Comment: (NOTE) The eGFR has been calculated using the CKD EPI equation. This calculation has not been validated in all clinical situations. eGFR's persistently <60 mL/min signify possible Chronic Kidney Disease.    Anion gap 9 5 - 15    Comment: Performed at [facility], [address] [place], [place] [address]  CBC     Status: Abnormal   Collection Time: 09/05/18  5:56 AM  Result Value Ref Range   WBC 10.9 (H) 4.0 - 10.5 K/uL   RBC 4.43 3.87 - 5.11 MIL/uL   Hemoglobin 13.5 12.0 - 15.0 g/dL   HCT 41.2 36.0 - 46.0 %   MCV 93.0 80.0 - 100.0 fL   MCH 30.5 26.0 - 34.0 pg   MCHC 32.8 30.0 - 36.0 g/dL   RDW  13.2 11.5 - 15.5 %   Platelets 298 150 - 400 K/uL   nRBC 0.0 0.0 - 0.2 %    Comment: Performed at [facility], [address] [place], [place] [address]  CBC     Status: Abnormal   Collection Time: 09/07/18  4:26 AM  Result Value Ref Range   WBC 20.8 (H) 4.0 - 10.5 K/uL   RBC 4.28 3.87 - 5.11 MIL/uL   Hemoglobin 13.1 12.0 - 15.0 g/dL   HCT 39.5 36.0 - 46.0 %   MCV 92.3 80.0 - 100.0 fL   MCH 30.6 26.0 - 34.0 pg   MCHC 33.2 30.0 - 36.0 g/dL   RDW 13.6 11.5 - 15.5 %   Platelets 311 150 - 400 K/uL   nRBC 0.0 0.0 - 0.2 %    Comment: Performed at [facility], [place], [place] [address]   Objective  Body mass index is 23.05 kg/m. Wt Readings from Last 3 Encounters:  09/13/18 150 lb 8 oz (68.3 kg)  09/05/18 146 lb 8 oz (66.5 kg)  07/01/18 154 lb (69.9 kg)   Temp Readings from Last 3 Encounters:  09/13/18 98 F (36.7 C) (Oral)  09/08/18 97.8 F (36.6 C) (Oral)  07/01/18 97.8 F (36.6 C) (Oral)   BP Readings from Last 3 Encounters:  09/13/18 (!) 122/58  09/08/18 (!) 152/87  07/01/18 110/60   Pulse Readings from Last 3 Encounters:  09/13/18 64  09/08/18 71  07/01/18 69    Physical Exam  Constitutional: She is oriented to person, place, and time. Vital signs are normal. She appears well-developed and well-nourished. She is cooperative.  HENT:  Head: Normocephalic and atraumatic.  Mouth/Throat: Oropharynx is clear and moist and mucous membranes are normal.  Eyes: Pupils are equal, round, and reactive to light. Conjunctivae are normal.  Cardiovascular: Normal rate, regular rhythm and normal heart sounds.  Pulmonary/Chest: Effort normal and breath sounds normal. She has no wheezes.  Neurological: She is alert and oriented to person, place, and time. Gait normal.  Skin: Skin is warm, dry and intact.  Psychiatric: She has a normal mood and affect. Her speech is normal and behavior is normal. Judgment and thought content normal. Cognition  and memory are normal.  Nursing note and vitals reviewed.   Assessment   1. Copd exacerbation improved  2. HM Plan   1. Doing better finish augmentin and prednisone  Assessed for O2 today on 2L and w/o exertion O2 97% Walked w/o o2 O2 94% At rest O2 w 2L O2 96%  Resting O2 96%   Cont pulmicort neb with duoneb  D/c advair its easier for daughter to use neb  Given trelegy sample x 1 today  2.  Had flu 2019  prevnar 08/11/17.  Disc Tdap, shingrix  pna 23had 07/15/18  Possibly had zostavx 09/2014 Dermatology address at f/u   09/02/14 DEXA reviewed +osteopenia ? If ever had colonoscopy, ? Last mammogram, s/p hysterectomy  Out of age window all  Provider: Dr. Olivia Mackie McLean-Scocuzza-Internal Medicine

## 2018-09-16 ENCOUNTER — Ambulatory Visit: Payer: Medicare Other | Admitting: Internal Medicine

## 2018-09-19 ENCOUNTER — Telehealth: Payer: Self-pay | Admitting: Internal Medicine

## 2018-09-19 ENCOUNTER — Ambulatory Visit: Payer: Medicare Other | Admitting: Internal Medicine

## 2018-09-19 NOTE — Telephone Encounter (Signed)
Please advise  Relation to pt: [name],Makayla  Call back number: [phone] Pharmacy: [facility] [id number] - [place], [place] - [address]         [phone] (Phone) [phone] (Fax)  Reason for call:  patient states PCP gave her "trelegy" samples and patient will run out shortly. Daughter states patient is doing great on the new medication and would like a refill / Rx sent into her pharmacy, please advise

## 2018-09-19 NOTE — Telephone Encounter (Signed)
Copied from CRM [id number]. Topic: General - Other >> Sep 19, 2018  2:07 PM Elliot GaultBell, Tiffany M wrote: Relation to pt: Ward,[name]  Call back number: [phone] [facility] [address] - , [place] - [address] [phone]) [phone])  Reason for call:  patient states PCP gave her "trelegy" samples and patient will run out shortly. Daughter states patient is doing great on the new medication and would like a refill / Rx sent into her pharmacy, please advise

## 2018-09-20 ENCOUNTER — Other Ambulatory Visit: Payer: Self-pay | Admitting: Internal Medicine

## 2018-09-20 DIAGNOSIS — J449 Chronic obstructive pulmonary disease, unspecified: Secondary | ICD-10-CM

## 2018-09-20 MED ORDER — FLUTICASONE-UMECLIDIN-VILANT 100-62.5-25 MCG/INH IN AEPB: 1.0000 | INHALATION_SPRAY | Freq: Every day | RESPIRATORY_TRACT | 12 refills | Status: DC

## 2018-10-17 ENCOUNTER — Other Ambulatory Visit: Payer: Self-pay | Admitting: Internal Medicine

## 2018-10-17 DIAGNOSIS — J4520 Mild intermittent asthma, uncomplicated: Secondary | ICD-10-CM

## 2018-10-17 MED ORDER — CETIRIZINE HCL 10 MG PO TABS: 10.0000 mg | ORAL_TABLET | Freq: Every day | ORAL | 3 refills | Status: DC

## 2018-11-13 ENCOUNTER — Other Ambulatory Visit: Payer: Self-pay | Admitting: Internal Medicine

## 2018-11-13 DIAGNOSIS — I10 Essential (primary) hypertension: Secondary | ICD-10-CM

## 2018-11-13 MED ORDER — LOSARTAN POTASSIUM 25 MG PO TABS: 25.0000 mg | ORAL_TABLET | Freq: Every day | ORAL | 3 refills | Status: DC

## 2018-12-09 ENCOUNTER — Other Ambulatory Visit: Payer: Self-pay | Admitting: Internal Medicine

## 2018-12-09 MED ORDER — MEMANTINE HCL 10 MG PO TABS: 10.0000 mg | ORAL_TABLET | Freq: Two times a day (BID) | ORAL | 0 refills | Status: DC

## 2018-12-09 NOTE — Telephone Encounter (Signed)
Copied from CRM [id number]. Topic: Quick Communication - Rx Refill/Question >> Dec 09, 2018  9:42 AM Fanny Bien wrote: Medication: memantine (NAMENDA) 10 MG tablet [[id number]]   Has the patient contacted their pharmacy? Yes told to call  Preferred Pharmacy (with phone number or street name): [facility] [address] [place] - [address] [phone] (Phone) [phone] (Fax)    Agent: Please be advised that RX refills may take up to 3 business days. We ask that you follow-up with your pharmacy.

## 2018-12-17 ENCOUNTER — Ambulatory Visit (INDEPENDENT_AMBULATORY_CARE_PROVIDER_SITE_OTHER): Payer: Medicare Other

## 2018-12-17 ENCOUNTER — Ambulatory Visit: Payer: Medicare Other | Admitting: Internal Medicine

## 2018-12-17 VITALS — BP 140/62 | HR 67 | Temp 98.3°F | Ht 67.75 in | Wt 156.3 lb

## 2018-12-17 DIAGNOSIS — G8929 Other chronic pain: Secondary | ICD-10-CM | POA: Diagnosis not present

## 2018-12-17 DIAGNOSIS — I10 Essential (primary) hypertension: Secondary | ICD-10-CM | POA: Diagnosis not present

## 2018-12-17 DIAGNOSIS — N3000 Acute cystitis without hematuria: Secondary | ICD-10-CM

## 2018-12-17 DIAGNOSIS — J441 Chronic obstructive pulmonary disease with (acute) exacerbation: Secondary | ICD-10-CM

## 2018-12-17 DIAGNOSIS — E559 Vitamin D deficiency, unspecified: Secondary | ICD-10-CM | POA: Diagnosis not present

## 2018-12-17 DIAGNOSIS — M25561 Pain in right knee: Secondary | ICD-10-CM

## 2018-12-17 DIAGNOSIS — Z1329 Encounter for screening for other suspected endocrine disorder: Secondary | ICD-10-CM

## 2018-12-17 DIAGNOSIS — R739 Hyperglycemia, unspecified: Secondary | ICD-10-CM | POA: Diagnosis not present

## 2018-12-17 MED ORDER — AZITHROMYCIN 250 MG PO TABS: ORAL_TABLET | ORAL | 0 refills | Status: DC

## 2018-12-17 MED ORDER — PREDNISONE 20 MG PO TABS: 20.0000 mg | ORAL_TABLET | Freq: Every day | ORAL | 0 refills | Status: DC

## 2018-12-17 NOTE — Progress Notes (Signed)
Pre visit review using our clinic review tool, if applicable. No additional management support is needed unless otherwise documented below in the visit note. 

## 2018-12-17 NOTE — Patient Instructions (Addendum)
Mucinex DM green label   Tylenol 500 mg up to 6 x per day    Chronic Obstructive Pulmonary Disease  Chronic obstructive pulmonary disease (COPD) is a long-term (chronic) condition that affects the lungs. COPD is a general term that can be used to describe many different lung problems that cause lung swelling (inflammation) and limit airflow, including chronic bronchitis and emphysema. If you have COPD, your lung function will probably never return to normal. In most cases, it gets worse over time. However, there are steps you can take to slow the progression of the disease and improve your quality of life. What are the causes? This condition may be caused by:  Smoking. This is the most common cause.  Certain genes passed down through families. What increases the risk? The following factors may make you more likely to develop this condition:  Secondhand smoke from cigarettes, pipes, or cigars.  Exposure to chemicals and other irritants such as fumes and dust in the work environment.  Chronic lung conditions or infections. What are the signs or symptoms? Symptoms of this condition include:  Shortness of breath, especially during physical activity.  Chronic cough with a large amount of thick mucus. Sometimes the cough may not have any mucus (dry cough).  Wheezing.  Rapid breaths.  Gray or bluish discoloration (cyanosis) of the skin, especially in your fingers, toes, or lips.  Feeling tired (fatigue).  Weight loss.  Chest tightness.  Frequent infections.  Episodes when breathing symptoms become much worse (exacerbations).  Swelling in the ankles, feet, or legs. This may occur in later stages of the disease. How is this diagnosed? This condition is diagnosed based on:  Your medical history.  A physical exam. You may also have tests, including:  Lung (pulmonary) function tests. This may include a spirometry test, which measures your ability to exhale properly.  Chest  X-ray.  CT scan.  Blood tests. How is this treated? This condition may be treated with:  Medicines. These may include inhaled rescue medicines to treat acute exacerbations as well as long-term, or maintenance, medicines to prevent flare-ups of COPD. ? Bronchodilators help treat COPD by dilating the airways to allow increased airflow and make your breathing more comfortable. ? Steroids can reduce airway inflammation and help prevent exacerbations.  Smoking cessation. If you smoke, your health care provider may ask you to quit, and may also recommend therapy or replacement products to help you quit.  Pulmonary rehabilitation. This may involve working with a team of health care providers and specialists, such as respiratory, occupational, and physical therapists.  Exercise and physical activity. These are beneficial for nearly all people with COPD.  Nutrition therapy to gain weight, if you are underweight.  Oxygen. Supplemental oxygen therapy is only helpful if you have a low oxygen level in your blood (hypoxemia).  Lung surgery or transplant.  Palliative care. This is to help people with COPD feel comfortable when treatment is no longer working. Follow these instructions at home: Medicines  Take over-the-counter and prescription medicines (inhaled or pills) only as told by your health care provider.  Talk to your health care provider before taking any cough or allergy medicines. You may need to avoid certain medicines that dry out your airways. Lifestyle  If you are a smoker, the most important thing that you can do is to stop smoking. Do not use any products that contain nicotine or tobacco, such as cigarettes and e-cigarettes. If you need help quitting, ask your health care provider.  Continuing to smoke will cause the disease to progress faster.  Avoid exposure to things that irritate your lungs, such as smoke, chemicals, and fumes.  Stay active, but balance activity with periods  of rest. Exercise and physical activity will help you maintain your ability to do things you want to do.  Learn and use relaxation techniques to manage stress and to control your breathing.  Get the right amount of sleep and get quality sleep. Most adults need 7 or more hours per night.  Eat healthy foods. Eating smaller, more frequent meals and resting before meals may help you maintain your strength. Controlled breathing Learn and use controlled breathing techniques as directed by your health care provider. Controlled breathing techniques include:  Pursed lip breathing. Start by breathing in (inhaling) through your nose for 1 second. Then, purse your lips as if you were going to whistle and breathe out (exhale) through the pursed lips for 2 seconds.  Diaphragmatic breathing. Start by putting one hand on your abdomen just above your waist. Inhale slowly through your nose. The hand on your abdomen should move out. Then purse your lips and exhale slowly. You should be able to feel the hand on your abdomen moving in as you exhale. Controlled coughing Learn and use controlled coughing to clear mucus from your lungs. Controlled coughing is a series of short, progressive coughs. The steps of controlled coughing are: 1. Lean your head slightly forward. 2. Breathe in deeply using diaphragmatic breathing. 3. Try to hold your breath for 3 seconds. 4. Keep your mouth slightly open while coughing twice. 5. Spit any mucus out into a tissue. 6. Rest and repeat the steps once or twice as needed. General instructions  Make sure you receive all the vaccines that your health care provider recommends, especially the pneumococcal and influenza vaccines. Preventing infection and hospitalization is very important when you have COPD.  Use oxygen therapy and pulmonary rehabilitation if directed to by your health care provider. If you require home oxygen therapy, ask your health care provider whether you should  purchase a pulse oximeter to measure your oxygen level at home.  Work with your health care provider to develop a COPD action plan. This will help you know what steps to take if your condition gets worse.  Keep other chronic health conditions under control as told by your health care provider.  Avoid extreme temperature and humidity changes.  Avoid contact with people who have an illness that spreads from person to person (is contagious), such as viral infections or pneumonia.  Keep all follow-up visits as told by your health care provider. This is important. Contact a health care provider if:  You are coughing up more mucus than usual.  There is a change in the color or thickness of your mucus.  Your breathing is more labored than usual.  Your breathing is faster than usual.  You have difficulty sleeping.  You need to use your rescue medicines or inhalers more often than expected.  You have trouble doing routine activities such as getting dressed or walking around the house. Get help right away if:  You have shortness of breath while you are resting.  You have shortness of breath that prevents you from: ? Being able to talk. ? Performing your usual physical activities.  You have chest pain lasting longer than 5 minutes.  Your skin color is more blue (cyanotic) than usual.  You measure low oxygen saturations for longer than 5 minutes with a pulse  oximeter.  You have a fever.  You feel too tired to breathe normally. Summary  Chronic obstructive pulmonary disease (COPD) is a long-term (chronic) condition that affects the lungs.  Your lung function will probably never return to normal. In most cases, it gets worse over time. However, there are steps you can take to slow the progression of the disease and improve your quality of life.  Treatment for COPD may include taking medicines, quitting smoking, pulmonary rehabilitation, and changes to diet and exercise. As the  disease progresses, you may need oxygen therapy, a lung transplant, or palliative care.  To help manage your condition, do not smoke, avoid exposure to things that irritate your lungs, stay up to date on all vaccines, and follow your health care provider's instructions for taking medicines. This information is not intended to replace advice given to you by your health care provider. Make sure you discuss any questions you have with your health care provider. Document Released: 08/02/2005 Document Revised: 04/18/2017 Document Reviewed: 11/27/2016 Elsevier Interactive Patient Education  2019 [employer] Inc.  Osteoarthritis  Osteoarthritis is a type of arthritis that affects tissue that covers the ends of bones in joints (cartilage). Cartilage acts as a cushion between the bones and helps them move smoothly. Osteoarthritis results when cartilage in the joints gets worn down. Osteoarthritis is sometimes called "wear and tear" arthritis. Osteoarthritis is the most common form of arthritis. It often occurs in older people. It is a condition that gets worse over time (a progressive condition). Joints that are most often affected by this condition are in:  Fingers.  Toes.  Hips.  Knees.  Spine, including neck and lower back. What are the causes? This condition is caused by age-related wearing down of cartilage that covers the ends of bones. What increases the risk? The following factors may make you more likely to develop this condition:  Older age.  Being overweight or obese.  Overuse of joints, such as in athletes.  Past injury of a joint.  Past surgery on a joint.  Family history of osteoarthritis. What are the signs or symptoms? The main symptoms of this condition are pain, swelling, and stiffness in the joint. The joint may lose its shape over time. Small pieces of bone or cartilage may break off and float inside of the joint, which may cause more pain and damage to the joint. Small  deposits of bone (osteophytes) may grow on the edges of the joint. Other symptoms may include:  A grating or scraping feeling inside the joint when you move it.  Popping or creaking sounds when you move. Symptoms may affect one or more joints. Osteoarthritis in a major joint, such as your knee or hip, can make it painful to walk or exercise. If you have osteoarthritis in your hands, you might not be able to grip items, twist your hand, or control small movements of your hands and fingers (fine motor skills). How is this diagnosed? This condition may be diagnosed based on:  Your medical history.  A physical exam.  Your symptoms.  X-rays of the affected joint(s).  Blood tests to rule out other types of arthritis. How is this treated? There is no cure for this condition, but treatment can help to control pain and improve joint function. Treatment plans may include:  A prescribed exercise program that allows for rest and joint relief. You may work with a physical therapist.  A weight control plan.  Pain relief techniques, such as: ? Applying heat  and cold to the joint. ? Electric pulses delivered to nerve endings under the skin (transcutaneous electrical nerve stimulation, or TENS). ? Massage. ? Certain nutritional supplements.  NSAIDs or prescription medicines to help relieve pain.  Medicine to help relieve pain and inflammation (corticosteroids). This can be given by mouth (orally) or as an injection.  Assistive devices, such as a brace, wrap, splint, specialized glove, or cane.  Surgery, such as: ? An osteotomy. This is done to reposition the bones and relieve pain or to remove loose pieces of bone and cartilage. ? Joint replacement surgery. You may need this surgery if you have very bad (advanced) osteoarthritis. Follow these instructions at home: Activity  Rest your affected joints as directed by your health care provider.  Do not drive or use heavy machinery while  taking prescription pain medicine.  Exercise as directed. Your health care provider or physical therapist may recommend specific types of exercise, such as: ? Strengthening exercises. These are done to strengthen the muscles that support joints that are affected by arthritis. They can be performed with weights or with exercise bands to add resistance. ? Aerobic activities. These are exercises, such as brisk walking or water aerobics, that get your heart pumping. ? Range-of-motion activities. These keep your joints easy to move. ? Balance and agility exercises. Managing pain, stiffness, and swelling      If directed, apply heat to the affected area as often as told by your health care provider. Use the heat source that your health care provider recommends, such as a moist heat pack or a heating pad. ? If you have a removable assistive device, remove it as told by your health care provider. ? Place a towel between your skin and the heat source. If your health care provider tells you to keep the assistive device on while you apply heat, place a towel between the assistive device and the heat source. ? Leave the heat on for 20-30 minutes. ? Remove the heat if your skin turns bright red. This is especially important if you are unable to feel pain, heat, or cold. You may have a greater risk of getting burned.  If directed, put ice on the affected joint: ? If you have a removable assistive device, remove it as told by your health care provider. ? Put ice in a plastic bag. ? Place a towel between your skin and the bag. If your health care provider tells you to keep the assistive device on during icing, place a towel between the assistive device and the bag. ? Leave the ice on for 20 minutes, 2-3 times a day. General instructions  Take over-the-counter and prescription medicines only as told by your health care provider.  Maintain a healthy weight. Follow instructions from your health care provider  for weight control. These may include dietary restrictions.  Do not use any products that contain nicotine or tobacco, such as cigarettes and e-cigarettes. These can delay bone healing. If you need help quitting, ask your health care provider.  Use assistive devices as directed by your health care provider.  Keep all follow-up visits as told by your health care provider. This is important. Where to find more information  [employer] and Musculoskeletal and Skin Diseases: www.niams.http://www.myers.net/nih.gov  [employer] Aging: https://walker.com/www.nia.nih.gov  American College of Rheumatology: www.rheumatology.org Contact a health care provider if:  Your skin turns red.  You develop a rash.  You have pain that gets worse.  You have a fever  along with joint or muscle aches. Get help right away if:  You lose a lot of weight.  You suddenly lose your appetite.  You have night sweats. Summary  Osteoarthritis is a type of arthritis that affects tissue covering the ends of bones in joints (cartilage).  This condition is caused by age-related wearing down of cartilage that covers the ends of bones.  The main symptom of this condition is pain, swelling, and stiffness in the joint.  There is no cure for this condition, but treatment can help to control pain and improve joint function. This information is not intended to replace advice given to you by your health care provider. Make sure you discuss any questions you have with your health care provider. Document Released: 10/23/2005 Document Revised: 07/30/2017 Document Reviewed: 06/26/2016 Elsevier Interactive Patient Education  2019 [employer].

## 2018-12-17 NOTE — Progress Notes (Signed)
Chief Complaint  Patient presents with  . Follow-up   F/u with daughter  1. Her other daughter visited recently and had URI now pt with wet cough started Monday, nasal congestion no fever no sweating. Daughter gave her multi sx cold otc medication last taken on 12/14/2018  2. C/l right knee giving way Sunday and intermittent pain daughter wants w/u today I.e Xary no pain today  3. H/o COPD doing better trelegy is helping daughter likes this  4. HTN controlled on cozaar 25 mg qd    Review of Systems  Constitutional: Negative for weight loss.  HENT: Positive for congestion.   Eyes: Negative for blurred vision.  Respiratory: Positive for cough and sputum production.   Cardiovascular: Negative for chest pain.  Gastrointestinal: Negative for abdominal pain.  Musculoskeletal: Positive for joint pain.  Skin: Negative for rash.  Neurological: Negative for headaches.  Psychiatric/Behavioral: Negative for depression.   Past Medical History:  Diagnosis Date  . Allergy   . Alzheimer's dementia (HCC)    CT head 08/25/15 mild cortical atrophy and chronic small vessel ischemic change. atheroscloerosis. paranasal sinus disease   . Anosmia   . Asthma   . Chicken pox   . COPD (chronic obstructive pulmonary disease) (HCC)   . Emphysema of lung (HCC)    per daughter never smoker exposed 2nd have via husband  . Hard of hearing    since childhood  . Hypertension   . Nasal polyps    surgery x 15 years ago and in ~2013 as of 10/08/17  . Syncope    03/2017 thought 2/2 diuretic stopped in [facility]   . UTI (urinary tract infection)    Past Surgical History:  Procedure Laterality Date  . ABDOMINAL HYSTERECTOMY     ? year ? reason ; partial   . BREAST BIOPSY     ? year   . nasal polyps removed     Family History  Problem Relation Age of Onset  . Cancer Mother        ? type  . Other Mother        scarlett fever, skin ulcer, died of complications after MVA   . Arthritis Mother   . Alcohol  abuse Paternal Uncle   . Arthritis Father    Social History   Socioeconomic History  . Marital status: Widowed    Spouse name: Not on file  . Number of children: Not on file  . Years of education: Not on file  . Highest education level: Not on file  Occupational History  . Not on file  Social Needs  . Financial resource strain: Not on file  . Food insecurity:    Worry: Not on file    Inability: Not on file  . Transportation needs:    Medical: Not on file    Non-medical: Not on file  Tobacco Use  . Smoking status: Former Games developer  . Smokeless tobacco: Never Used  Substance and Sexual Activity  . Alcohol use: No  . Drug use: No  . Sexual activity: Never  Lifestyle  . Physical activity:    Days per week: Not on file    Minutes per session: Not on file  . Stress: Not on file  Relationships  . Social connections:    Talks on phone: Not on file    Gets together: Not on file    Attends religious service: Not on file    Active member of club or organization: Not on file  Attends meetings of clubs or organizations: Not on file    Relationship status: Not on file  . Intimate partner violence:    Fear of current or ex partner: Not on file    Emotionally abused: Not on file    Physically abused: Not on file    Forced sexual activity: Not on file  Other Topics Concern  . 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 aspirin EC 81 MG tablet Take 81 mg by mouth daily.  . cetirizine (ZYRTEC) 10 MG tablet Take 1 tablet (10 mg total) by mouth daily.  Marland Kitchen. donepezil (ARICEPT) 10 MG tablet Take 1 tablet (10 mg total) by mouth at bedtime.  . Fluticasone-Umeclidin-Vilant (TRELEGY ELLIPTA) 100-62.5-25 MCG/INH AEPB Inhale into the lungs.  Marland Kitchen. FLUZONE HIGH-DOSE 0.5 ML  injection TO BE ADMINISTERED BY PHARMACIST FOR IMMUNIZATION  . ipratropium-albuterol (DUONEB) 0.5-2.5 (3) MG/3ML SOLN Take 3 mLs by nebulization 3 (three) times daily as needed (cough, wheezing, sob).  Marland Kitchen. losartan (COZAAR) 25 MG tablet Take 1 tablet (25 mg total) by mouth daily.  . memantine (NAMENDA) 10 MG tablet Take 1 tablet (10 mg total) by mouth 2 (two) times daily.  . predniSONE (STERAPRED UNI-PAK 21 TAB) 10 MG (21) TBPK tablet Taper by 10 mg daily  . PREVNAR 13 SUSP injection TO BE ADMINISTERED BY PHARMACIST FOR IMMUNIZATION   Allergies  Allergen Reactions  . Latex     rash   No results found for this or any previous visit (from the past 2160 hour(s)). Objective  Body mass index is 23.94 kg/m. Wt Readings from Last 3 Encounters:  12/17/18 156 lb 4.8 oz (70.9 kg)  09/13/18 150 lb 8 oz (68.3 kg)  09/05/18 146 lb 8 oz (66.5 kg)   Temp Readings from Last 3 Encounters:  12/17/18 98.3 F (36.8 C) (Oral)  09/13/18 98 F (36.7 C) (Oral)  09/08/18 97.8 F (36.6 C) (Oral)   BP Readings from Last 3 Encounters:  12/17/18 140/62  09/13/18 (!) 122/58  09/08/18 (!) 152/87   Pulse Readings from Last 3 Encounters:  12/17/18 67  09/13/18 64  09/08/18 71    Physical Exam Vitals signs and nursing note reviewed.  Constitutional:      Appearance: Normal appearance. She is well-developed and well-groomed.  HENT:     Head: Normocephalic and atraumatic.     Nose: Nose normal.     Mouth/Throat:     Mouth: Mucous membranes are moist.     Pharynx: Oropharynx is clear.  Eyes:     Conjunctiva/sclera: Conjunctivae normal.     Pupils: Pupils are equal, round, and reactive to light.  Cardiovascular:     Rate and Rhythm: Normal rate and regular rhythm.     Heart sounds: Normal heart sounds.  Pulmonary:     Effort: Pulmonary effort is normal.     Breath sounds: Wheezing present.     Comments: Mild wheezing   Skin:    General: Skin is warm and dry.  Neurological:     General: No  focal deficit present.     Mental Status: She is alert and oriented to person, place, and time. Mental status is at baseline.  Gait: Gait normal.  Psychiatric:        Attention and Perception: Attention and perception normal.        Mood and Affect: Mood and affect normal.        Speech: Speech normal.        Behavior: Behavior normal. Behavior is cooperative.        Thought Content: Thought content normal.        Cognition and Memory: Cognition and memory normal.        Judgment: Judgment normal.     Assessment   1. Copd exacerbation mild with URI  2. Right knee pain  3. HM 4. HTN controlled  Plan   1. Prednisone 20 mg x 5 days  zpack  2. Xray right knee today moderate arthritis  3.  Had flu 2019  prevnar 08/11/17. Tdap utd shingrix consider in future   pna 23had 07/15/18  Possibly had zostavx 09/2014 Dermatology appt pending  Eye Dr. Leonard Schwartz [place] appt pending  Labs today not fasting   09/02/14 DEXA reviewed +osteopenia ? If ever had colonoscopy, ? Last mammogram, s/p hysterectomyOut of age window all  4. Cont meds   Provider: Dr. French Ana McLean-Scocuzza-[facility] Medicine

## 2018-12-18 LAB — CBC WITH DIFFERENTIAL/PLATELET
Basophils Absolute: 0.1 10*3/uL (ref 0.0–0.1)
Basophils Relative: 1 % (ref 0.0–3.0)
EOS ABS: 0.7 10*3/uL (ref 0.0–0.7)
EOS PCT: 8.2 % — AB (ref 0.0–5.0)
HCT: 41 % (ref 36.0–46.0)
HEMOGLOBIN: 13.4 g/dL (ref 12.0–15.0)
Lymphocytes Relative: 23.4 % (ref 12.0–46.0)
Lymphs Abs: 2 10*3/uL (ref 0.7–4.0)
MCHC: 32.7 g/dL (ref 30.0–36.0)
MCV: 93.5 fl (ref 78.0–100.0)
Monocytes Absolute: 0.6 10*3/uL (ref 0.1–1.0)
Monocytes Relative: 7.8 % (ref 3.0–12.0)
Neutro Abs: 5 10*3/uL (ref 1.4–7.7)
Neutrophils Relative %: 59.6 % (ref 43.0–77.0)
Platelets: 226 10*3/uL (ref 150.0–400.0)
RBC: 4.39 Mil/uL (ref 3.87–5.11)
RDW: 15.2 % (ref 11.5–15.5)
WBC: 8.4 10*3/uL (ref 4.0–10.5)

## 2018-12-18 LAB — TSH: TSH: 2.55 u[IU]/mL (ref 0.35–4.50)

## 2018-12-18 LAB — URINALYSIS, ROUTINE W REFLEX MICROSCOPIC
BACTERIA UA: NONE SEEN /HPF
Bilirubin Urine: NEGATIVE
GLUCOSE, UA: NEGATIVE
HGB URINE DIPSTICK: NEGATIVE
HYALINE CAST: NONE SEEN /LPF
Ketones, ur: NEGATIVE
Nitrite: NEGATIVE
PROTEIN: NEGATIVE
RBC / HPF: NONE SEEN /HPF (ref 0–2)
Specific Gravity, Urine: 1.017 (ref 1.001–1.03)
pH: <=5 (ref 5.0–8.0)

## 2018-12-18 LAB — COMPREHENSIVE METABOLIC PANEL
ALBUMIN: 3.7 g/dL (ref 3.5–5.2)
ALK PHOS: 75 U/L (ref 39–117)
ALT: 14 U/L (ref 0–35)
AST: 17 U/L (ref 0–37)
BUN: 25 mg/dL — AB (ref 6–23)
CO2: 23 mEq/L (ref 19–32)
CREATININE: 1.09 mg/dL (ref 0.40–1.20)
Calcium: 9.4 mg/dL (ref 8.4–10.5)
Chloride: 107 mEq/L (ref 96–112)
GFR: 46.76 mL/min — ABNORMAL LOW (ref >60.00)
Glucose, Bld: 84 mg/dL (ref 70–99)
POTASSIUM: 4.6 meq/L (ref 3.5–5.1)
SODIUM: 141 meq/L (ref 135–145)
TOTAL PROTEIN: 6.6 g/dL (ref 6.0–8.3)
Total Bilirubin: 0.3 mg/dL (ref 0.2–1.2)

## 2018-12-18 LAB — URINE CULTURE
MICRO NUMBER: 179890
SPECIMEN QUALITY:: ADEQUATE

## 2018-12-18 LAB — VITAMIN D 25 HYDROXY (VIT D DEFICIENCY, FRACTURES): VITD: 42.5 ng/mL (ref 30.00–100.00)

## 2018-12-18 LAB — HEMOGLOBIN A1C: HEMOGLOBIN A1C: 5.7 % (ref 4.6–6.5)

## 2018-12-19 ENCOUNTER — Telehealth: Payer: Self-pay

## 2018-12-19 ENCOUNTER — Encounter: Payer: Self-pay | Admitting: Internal Medicine

## 2018-12-19 ENCOUNTER — Other Ambulatory Visit: Payer: Self-pay | Admitting: Internal Medicine

## 2018-12-19 DIAGNOSIS — M1711 Unilateral primary osteoarthritis, right knee: Secondary | ICD-10-CM

## 2018-12-19 NOTE — Telephone Encounter (Signed)
Copied from CRM [id number]. Topic: Referral - Request for Referral >> Dec 19, 2018  9:56 AM Lynne Logan D wrote: Has patient seen PCP for this complaint? Yes *If NO, is insurance requiring patient see PCP for this issue before PCP can refer them? Referral for which specialty: Orthopedic Preferred provider/office: Dr. Francesco Sor / [facility] Reason for referral: Right knee / Arthritis   Please contact Daughter [facility] once referral has been made.

## 2019-03-04 ENCOUNTER — Ambulatory Visit (INDEPENDENT_AMBULATORY_CARE_PROVIDER_SITE_OTHER): Payer: Medicare Other | Admitting: Internal Medicine

## 2019-03-04 ENCOUNTER — Encounter: Payer: Self-pay | Admitting: Internal Medicine

## 2019-03-04 DIAGNOSIS — N3000 Acute cystitis without hematuria: Secondary | ICD-10-CM | POA: Diagnosis not present

## 2019-03-04 MED ORDER — AMOXICILLIN-POT CLAVULANATE 875-125 MG PO TABS: 1.0000 | ORAL_TABLET | Freq: Two times a day (BID) | ORAL | 0 refills | Status: DC

## 2019-03-04 NOTE — Addendum Note (Signed)
Addended by: Levin Dagostino S on: 03/04/2019 02:18 PM   Modules accepted: Orders  

## 2019-03-04 NOTE — Addendum Note (Signed)
Addended by: Warden Fillers on: 03/04/2019 02:18 PM   Modules accepted: Orders

## 2019-03-04 NOTE — Progress Notes (Signed)
Failed Doxy Note/Telephone  [name]la [name]  on 03/04/19 at 11:09 AM EDT by a video enabled telemedicine application and verified that I am speaking with the correct person using two identifiers. Location patient: home Location provider:work  Persons participating in the virtual visit: patient, provider  I discussed the limitations of evaluation and management by telemedicine and the availability of in person appointments. The patient expressed understanding and agreed to proceed.   HPI: hx per daughter 1. Daughter c/w UTI with h/o UTI and reduced appetite and mood changes happy and cyring recently w/in the last week. She does wear pads and changes 2x per day for urinary incontinence chronically. Denies increased freq or burning but pt normally does not complain she also reports she has been asleep and having animated conversations while she is asleep for 6-8 months   2. Daughter needs a letter to be the payee in charge of social security funds    ROS: See pertinent positives and negatives per HPI.  Past Medical History:  Diagnosis Date  . Allergy   . Alzheimer's dementia (HCC)    CT head 08/25/15 mild cortical atrophy and chronic small vessel ischemic change. atheroscloerosis. paranasal sinus disease   . Anosmia   . Asthma   . Chicken pox   . COPD (chronic obstructive pulmonary disease) (HCC)   . Emphysema of lung (HCC)    per daughter never smoker exposed 2nd have via husband  . Hard of hearing    since childhood  . Hypertension   . Nasal polyps    surgery x 15 years ago and in ~2013 as of 10/08/17  . Syncope    03/2017 [facility]   . UTI (urinary tract infection)     Past Surgical History:  Procedure Laterality Date  . ABDOMINAL HYSTERECTOMY     ? year ? reason ; partial   . BREAST BIOPSY     ? year   . nasal polyps removed      Family History  Problem Relation Age of Onset  . Cancer Mother     ? type  . Other Mother        scarlett fever, skin ulcer, died of complications after MVA   . Arthritis Mother   . Alcohol abuse Paternal Uncle   . Arthritis Father     SOCIAL HX: lives with daughter Makayla LaymanMary [name]    Current Outpatient Medications:  .  amoxicillin-clavulanate (AUGMENTIN) 875-125 MG tablet, Take 1 tablet by mouth 2 (two) times daily. w food, Disp: 14 tablet, Rfl: 0 .  aspirin EC 81 MG tablet, Take 81 mg by mouth daily., Disp: , Rfl:  .  cetirizine (ZYRTEC) 10 MG tablet, Take 1 tablet (10 mg total) by mouth daily., Disp: 90 tablet, Rfl: 3 .  donepezil (ARICEPT) 10 MG tablet, Take 1 tablet (10 mg total) by mouth at bedtime., Disp: 90 tablet, Rfl: 3 .  Fluticasone-Umeclidin-Vilant (TRELEGY ELLIPTA) 100-62.5-25 MCG/INH AEPB, Inhale into the lungs., Disp: , Rfl:  .  FLUZONE HIGH-DOSE 0.5 ML injection, TO BE ADMINISTERED BY PHARMACIST FOR IMMUNIZATION, Disp: , Rfl: 0 .  ipratropium-albuterol (DUONEB) 0.5-2.5 (3) MG/3ML SOLN, Take 3 mLs by nebulization 3 (three) times daily as needed (cough, wheezing, sob)., Disp: 360 mL, Rfl: 11 .  losartan (COZAAR) 25 MG tablet, Take 1 tablet (25 mg total) by mouth daily., Disp: 90 tablet, Rfl: 3 .  memantine (NAMENDA) 10 MG tablet, Take 1 tablet (  10 mg total) by mouth 2 (two) times daily., Disp: 180 tablet, Rfl: 0 .  PREVNAR 13 SUSP injection, TO BE ADMINISTERED BY PHARMACIST FOR IMMUNIZATION, Disp: , Rfl: 0  EXAM:  VITALS per patient if applicable:  GENERAL: alert, oriented, appears well and in no acute distress  PSYCH/NEURO: pleasant and cooperative, no obvious depression or anxiety, speech and thought processing grossly intact  ASSESSMENT AND PLAN:  Discussed the following assessment and plan:  Acute cystitis without hematuria - Plan: Urinalysis, Routine w reflex microscopic, Urine Culture, amoxicillin-clavulanate (AUGMENTIN) 875-125 MG tablet UA and culture today  Call back if not better     I discussed the assessment and  treatment plan with the patient. The patient was provided an opportunity to ask questions and all were answered. The patient agreed with the plan and demonstrated an understanding of the instructions.   The patient was advised to call back or seek an in-person evaluation if the symptoms worsen or if the condition fails to improve as anticipated.  Time spent 15 minutes  Bevelyn Buckles, MD

## 2019-03-05 LAB — URINALYSIS, ROUTINE W REFLEX MICROSCOPIC
Bilirubin, UA: NEGATIVE
Glucose, UA: NEGATIVE
Ketones, UA: NEGATIVE
Nitrite, UA: POSITIVE — AB
Protein,UA: NEGATIVE
RBC, UA: NEGATIVE
Specific Gravity, UA: 1.019 (ref 1.005–1.030)
Urobilinogen, Ur: 0.2 mg/dL (ref 0.2–1.0)
pH, UA: 5 (ref 5.0–7.5)

## 2019-03-05 LAB — MICROSCOPIC EXAMINATION: Casts: NONE SEEN /lpf

## 2019-03-08 LAB — URINE CULTURE

## 2019-03-10 ENCOUNTER — Other Ambulatory Visit: Payer: Self-pay | Admitting: Internal Medicine

## 2019-03-10 DIAGNOSIS — N3000 Acute cystitis without hematuria: Secondary | ICD-10-CM

## 2019-03-10 MED ORDER — CIPROFLOXACIN HCL 500 MG PO TABS: 500.0000 mg | ORAL_TABLET | Freq: Two times a day (BID) | ORAL | 0 refills | Status: DC

## 2019-03-11 ENCOUNTER — Other Ambulatory Visit: Payer: Self-pay | Admitting: Internal Medicine

## 2019-03-11 DIAGNOSIS — F0390 Unspecified dementia without behavioral disturbance: Secondary | ICD-10-CM

## 2019-03-11 MED ORDER — MEMANTINE HCL 10 MG PO TABS: 10.0000 mg | ORAL_TABLET | Freq: Two times a day (BID) | ORAL | 3 refills | Status: DC

## 2019-04-17 ENCOUNTER — Ambulatory Visit (INDEPENDENT_AMBULATORY_CARE_PROVIDER_SITE_OTHER): Payer: Medicare Other | Admitting: Internal Medicine

## 2019-04-17 ENCOUNTER — Other Ambulatory Visit: Payer: Self-pay

## 2019-04-17 DIAGNOSIS — F0390 Unspecified dementia without behavioral disturbance: Secondary | ICD-10-CM | POA: Diagnosis not present

## 2019-04-17 DIAGNOSIS — F329 Major depressive disorder, single episode, unspecified: Secondary | ICD-10-CM | POA: Insufficient documentation

## 2019-04-17 DIAGNOSIS — M1990 Unspecified osteoarthritis, unspecified site: Secondary | ICD-10-CM

## 2019-04-17 DIAGNOSIS — N3000 Acute cystitis without hematuria: Secondary | ICD-10-CM | POA: Diagnosis not present

## 2019-04-17 DIAGNOSIS — F3289 Other specified depressive episodes: Secondary | ICD-10-CM

## 2019-04-17 DIAGNOSIS — L600 Ingrowing nail: Secondary | ICD-10-CM | POA: Diagnosis not present

## 2019-04-17 DIAGNOSIS — F32A Depression, unspecified: Secondary | ICD-10-CM | POA: Insufficient documentation

## 2019-04-17 MED ORDER — MUPIROCIN 2 % EX OINT: 1.0000 "application " | TOPICAL_OINTMENT | Freq: Two times a day (BID) | CUTANEOUS | 1 refills | Status: DC

## 2019-04-17 NOTE — Patient Instructions (Signed)
Mirtazapine tablets What is this medicine? MIRTAZAPINE (mir TAZ a peen) is used to treat depression. This medicine may be used for other purposes; ask your health care provider or pharmacist if you have questions. COMMON BRAND NAME(S): Remeron What should I tell my health care provider before I take this medicine? They need to know if you have any of these conditions: -bipolar disorder -glaucoma -kidney disease -liver disease -suicidal thoughts -an unusual or allergic reaction to mirtazapine, other medicines, foods, dyes, or preservatives -pregnant or trying to get pregnant -breast-feeding How should I use this medicine? Take this medicine by mouth with a glass of water. Follow the directions on the prescription label. Take your medicine at regular intervals. Do not take your medicine more often than directed. Do not stop taking this medicine suddenly except upon the advice of your doctor. Stopping this medicine too quickly may cause serious side effects or your condition may worsen. A special MedGuide will be given to you by the pharmacist with each prescription and refill. Be sure to read this information carefully each time. Talk to your pediatrician regarding the use of this medicine in children. Special care may be needed. Overdosage: If you think you have taken too much of this medicine contact a poison control center or emergency room at once. NOTE: This medicine is only for you. Do not share this medicine with others. What if I miss a dose? If you miss a dose, take it as soon as you can. If it is almost time for your next dose, take only that dose. Do not take double or extra doses. What may interact with this medicine? Do not take this medicine with any of the following medications: -linezolid -MAOIs like Carbex, Eldepryl, Marplan, Nardil, and Parnate -methylene blue (injected into a vein) This medicine may also interact with the following medications: -alcohol -antiviral  medicines for HIV or AIDS -certain medicines that treat or prevent blood clots like warfarin -certain medicines for depression, anxiety, or psychotic disturbances -certain medicines for fungal infections like ketoconazole and itraconazole -certain medicines for migraine headache like almotriptan, eletriptan, frovatriptan, naratriptan, rizatriptan, sumatriptan, zolmitriptan -certain medicines for seizures like carbamazepine or phenytoin -certain medicines for sleep -cimetidine -erythromycin -fentanyl -lithium -medicines for blood pressure -nefazodone -rasagiline -rifampin -supplements like St. John's wort, kava kava, valerian -tramadol -tryptophan This list may not describe all possible interactions. Give your health care provider a list of all the medicines, herbs, non-prescription drugs, or dietary supplements you use. Also tell them if you smoke, drink alcohol, or use illegal drugs. Some items may interact with your medicine. What should I watch for while using this medicine? Tell your doctor if your symptoms do not get better or if they get worse. Visit your doctor or health care professional for regular checks on your progress. Because it may take several weeks to see the full effects of this medicine, it is important to continue your treatment as prescribed by your doctor. Patients and their families should watch out for new or worsening thoughts of suicide or depression. Also watch out for sudden changes in feelings such as feeling anxious, agitated, panicky, irritable, hostile, aggressive, impulsive, severely restless, overly excited and hyperactive, or not being able to sleep. If this happens, especially at the beginning of treatment or after a change in dose, call your health care professional. You may get drowsy or dizzy. Do not drive, use machinery, or do anything that needs mental alertness until you know how this medicine affects you. Do not   stand or sit up quickly, especially if  you are an older patient. This reduces the risk of dizzy or fainting spells. Alcohol may interfere with the effect of this medicine. Avoid alcoholic drinks. This medicine may cause dry eyes and blurred vision. If you wear contact lenses you may feel some discomfort. Lubricating drops may help. See your eye doctor if the problem does not go away or is severe. Your mouth may get dry. Chewing sugarless gum or sucking hard candy, and drinking plenty of water may help. Contact your doctor if the problem does not go away or is severe. What side effects may I notice from receiving this medicine? Side effects that you should report to your doctor or health care professional as soon as possible: -allergic reactions like skin rash, itching or hives, swelling of the face, lips, or tongue -anxious -changes in vision -chest pain -confusion -elevated mood, decreased need for sleep, racing thoughts, impulsive behavior -eye pain -fast, irregular heartbeat -feeling faint or lightheaded, falls -feeling agitated, angry, or irritable -fever or chills, sore throat -hallucination, loss of contact with reality -loss of balance or coordination -mouth sores -redness, blistering, peeling or loosening of the skin, including inside the mouth -restlessness, pacing, inability to keep still -seizures -stiff muscles -suicidal thoughts or other mood changes -trouble passing urine or change in the amount of urine -trouble sleeping -unusual bleeding or bruising -unusually weak or tired -vomiting Side effects that usually do not require medical attention (report to your doctor or health care professional if they continue or are bothersome): -change in appetite -constipation -dizziness -dry mouth -muscle aches or pains -nausea -tired -weight gain This list may not describe all possible side effects. Call your doctor for medical advice about side effects. You may report side effects to FDA at 1-800-FDA-1088. Where  should I keep my medicine? Keep out of the reach of children. Store at room temperature between 15 and 30 degrees C (59 and 86 degrees F) Protect from light and moisture. Throw away any unused medicine after the expiration date. NOTE: This sheet is a summary. It may not cover all possible information. If you have questions about this medicine, talk to your doctor, pharmacist, or health care provider.  2019 Elsevier/Gold Standard (2016-03-23 17:30:45)  Citalopram tablets What is this medicine? CITALOPRAM (sye TAL oh pram) is a medicine for depression. This medicine may be used for other purposes; ask your health care provider or pharmacist if you have questions. COMMON BRAND NAME(S): Celexa What should I tell my health care provider before I take this medicine? They need to know if you have any of these conditions: -bleeding disorders -bipolar disorder or a family history of bipolar disorder -glaucoma -heart disease -history of irregular heartbeat -kidney disease -liver disease -low levels of magnesium or potassium in the blood -receiving electroconvulsive therapy -seizures -suicidal thoughts, plans, or attempt; a previous suicide attempt by you or a family member -take medicines that treat or prevent blood clots -thyroid disease -an unusual or allergic reaction to citalopram, escitalopram, other medicines, foods, dyes, or preservatives -pregnant or trying to become pregnant -breast-feeding How should I use this medicine? Take this medicine by mouth with a glass of water. Follow the directions on the prescription label. You can take it with or without food. Take your medicine at regular intervals. Do not take your medicine more often than directed. Do not stop taking this medicine suddenly except upon the advice of your doctor. Stopping this medicine too quickly may cause serious side  effects or your condition may worsen. A special MedGuide will be given to you by the pharmacist with  each prescription and refill. Be sure to read this information carefully each time. Talk to your pediatrician regarding the use of this medicine in children. Special care may be needed. Patients over 83 years old may have a stronger reaction and need a smaller dose. Overdosage: If you think you have taken too much of this medicine contact a poison control center or emergency room at once. NOTE: This medicine is only for you. Do not share this medicine with others. What if I miss a dose? If you miss a dose, take it as soon as you can. If it is almost time for your next dose, take only that dose. Do not take double or extra doses. What may interact with this medicine? Do not take this medicine with any of the following medications: -certain medicines for fungal infections like fluconazole, itraconazole, ketoconazole, posaconazole, voriconazole -cisapride -dofetilide -dronedarone -escitalopram -linezolid -MAOIs like Carbex, Eldepryl, Marplan, Nardil, and Parnate -methylene blue (injected into a vein) -pimozide -thioridazine -ziprasidone This medicine may also interact with the following medications: -alcohol -amphetamines -aspirin and aspirin-like medicines -carbamazepine -certain medicines for depression, anxiety, or psychotic disturbances -certain medicines for infections like chloroquine, clarithromycin, erythromycin, furazolidone, isoniazid, pentamidine -certain medicines for migraine headaches like almotriptan, eletriptan, frovatriptan, naratriptan, rizatriptan, sumatriptan, zolmitriptan -certain medicines for sleep -certain medicines that treat or prevent blood clots like dalteparin, enoxaparin, warfarin -cimetidine -diuretics -fentanyl -lithium -methadone -metoprolol -NSAIDs, medicines for pain and inflammation, like ibuprofen or naproxen -omeprazole -other medicines that prolong the QT interval (cause an abnormal heart rhythm) -procarbazine -rasagiline -supplements like  St. John's wort, kava kava, valerian -tramadol -tryptophan This list may not describe all possible interactions. Give your health care provider a list of all the medicines, herbs, non-prescription drugs, or dietary supplements you use. Also tell them if you smoke, drink alcohol, or use illegal drugs. Some items may interact with your medicine. What should I watch for while using this medicine? Tell your doctor if your symptoms do not get better or if they get worse. Visit your doctor or health care professional for regular checks on your progress. Because it may take several weeks to see the full effects of this medicine, it is important to continue your treatment as prescribed by your doctor. Patients and their families should watch out for new or worsening thoughts of suicide or depression. Also watch out for sudden changes in feelings such as feeling anxious, agitated, panicky, irritable, hostile, aggressive, impulsive, severely restless, overly excited and hyperactive, or not being able to sleep. If this happens, especially at the beginning of treatment or after a change in dose, call your health care professional. Bonita QuinYou may get drowsy or dizzy. Do not drive, use machinery, or do anything that needs mental alertness until you know how this medicine affects you. Do not stand or sit up quickly, especially if you are an older patient. This reduces the risk of dizzy or fainting spells. Alcohol may interfere with the effect of this medicine. Avoid alcoholic drinks. Your mouth may get dry. Chewing sugarless gum or sucking hard candy, and drinking plenty of water will help. Contact your doctor if the problem does not go away or is severe. What side effects may I notice from receiving this medicine? Side effects that you should report to your doctor or health care professional as soon as possible: -allergic reactions like skin rash, itching or hives, swelling of the  face, lips, or tongue -anxious -black,  tarry stools -breathing problems -changes in vision -chest pain -confusion -elevated mood, decreased need for sleep, racing thoughts, impulsive behavior -eye pain -fast, irregular heartbeat -feeling faint or lightheaded, falls -feeling agitated, angry, or irritable -hallucination, loss of contact with reality -loss of balance or coordination -loss of memory -painful or prolonged erections -restlessness, pacing, inability to keep still -seizures -stiff muscles -suicidal thoughts or other mood changes -trouble sleeping -unusual bleeding or bruising -unusually weak or tired -vomiting Side effects that usually do not require medical attention (report to your doctor or health care professional if they continue or are bothersome): -change in appetite or weight -change in sex drive or performance -dizziness -headache -increased sweating -indigestion, nausea -tremors This list may not describe all possible side effects. Call your doctor for medical advice about side effects. You may report side effects to FDA at 1-800-FDA-1088. Where should I keep my medicine? Keep out of reach of children. Store at room temperature between 15 and 30 degrees C (59 and 86 degrees F). Throw away any unused medicine after the expiration date. NOTE: This sheet is a summary. It may not cover all possible information. If you have questions about this medicine, talk to your doctor, pharmacist, or health care provider.  2019 Elsevier/Gold Standard (2016-03-27 13:18:52)  Ingrown Toenail An ingrown toenail occurs when the corner or sides of a toenail grow into the surrounding skin. This causes discomfort and pain. The big toe is most commonly affected, but any of the toes can be affected. If an ingrown toenail is not treated, it can become infected. What are the causes? This condition may be caused by:  Wearing shoes that are too small or tight.  An injury, such as stubbing your toe or having your toe  stepped on.  Improper cutting or care of your toenails.  Having nail or foot abnormalities that were present from birth (congenital abnormalities), such as having a nail that is too big for your toe. What increases the risk? The following factors may make you more likely to develop ingrown toenails:  Age. Nails tend to get thicker with age, so ingrown nails are more common among older people.  Cutting your toenails incorrectly, such as cutting them very short or cutting them unevenly. An ingrown toenail is more likely to get infected if you have:  Diabetes.  Blood flow (circulation) problems. What are the signs or symptoms? Symptoms of an ingrown toenail may include:  Pain, soreness, or tenderness.  Redness.  Swelling.  Hardening of the skin that surrounds the toenail. Signs that an ingrown toenail may be infected include:  Fluid or pus.  Symptoms that get worse instead of better. How is this diagnosed? An ingrown toenail may be diagnosed based on your medical history, your symptoms, and a physical exam. If you have fluid or blood coming from your toenail, a sample may be collected to test for the specific type of bacteria that is causing the infection. How is this treated? Treatment depends on how severe your ingrown toenail is. You may be able to care for your toenail at home.  If you have an infection, you may be prescribed antibiotic medicines.  If you have fluid or pus draining from your toenail, your health care provider may drain it.  If you have trouble walking, you may be given crutches to use.  If you have a severe or infected ingrown toenail, you may need a procedure to remove part or all of  the nail. Follow these instructions at home: Foot care   Do not pick at your toenail or try to remove it yourself.  Soak your foot in warm, soapy water. Do this for 20 minutes, 3 times a day, or as often as told by your health care provider. This helps to keep your toe  clean and keep your skin soft.  Wear shoes that fit well and are not too tight. Your health care provider may recommend that you wear open-toed shoes while you heal.  Trim your toenails regularly and carefully. Cut your toenails straight across to prevent injury to the skin at the corners of the toenail. Do not cut your nails in a curved shape.  Keep your feet clean and dry to help prevent infection. Medicines  Take over-the-counter and prescription medicines only as told by your health care provider.  If you were prescribed an antibiotic, take it as told by your health care provider. Do not stop taking the antibiotic even if you start to feel better. Activity  Return to your normal activities as told by your health care provider. Ask your health care provider what activities are safe for you.  Avoid activities that cause pain. General instructions  If your health care provider told you to use crutches to help you move around, use them as instructed.  Keep all follow-up visits as told by your health care provider. This is important. Contact a health care provider if:  You have more redness, swelling, pain, or other symptoms that do not improve with treatment.  You have fluid, blood, or pus coming from your toenail. Get help right away if:  You have a red streak on your skin that starts at your foot and spreads up your leg.  You have a fever. Summary  An ingrown toenail occurs when the corner or sides of a toenail grow into the surrounding skin. This causes discomfort and pain. The big toe is most commonly affected, but any of the toes can be affected.  If an ingrown toenail is not treated, it can become infected.  Fluid or pus draining from your toenail is a sign of infection. Your health care provider may need to drain it. You may be given antibiotics to treat the infection.  Trimming your toenails regularly and properly can help you prevent an ingrown toenail. This  information is not intended to replace advice given to you by your health care provider. Make sure you discuss any questions you have with your health care provider. Document Released: 10/20/2000 Document Revised: 07/11/2017 Document Reviewed: 07/11/2017 Elsevier Interactive Patient Education  2019 [employer].

## 2019-04-17 NOTE — Progress Notes (Signed)
Telephone Note  I connected with Makayla [name]   on 04/17/19 at  4:15 PM EDT by a telephone and verified that I am speaking with the correct person using two identifiers.  Location patient: home Location provider:work  Persons participating in the virtual visit: daughter Makayla LaymanMary [name], provider  I discussed the limitations of evaluation and management by telemedicine and the availability of in person appointments. The patient expressed understanding and agreed to proceed.   HPI: 1. Daughter gives history and c/o right great toe ? Ingrown toenail with bleeding and she thinks her mom is having pain she wants to know what to do  2. UTI 02/2019 tx'ed with cipro will repeat UA and culture  3. Dementia and depression-daughter is c/w depression/mood swings 1x w/in the last 2-3 weeks pt seemed depressed and feels useless due to unable to do IADLS and she gets frustrated due to inablity I.e fold clothes and cut up food and comments she feels useless and does not want to talk when she gets frustrated. She reports her mom seems withdrawn, sad and quiet recently but happy majority of the time and talkative. She mentions her mom stated she was homesick. Her mother is not aggressive. She also [place] to her daughter and gets sad/upset and daughter has to repeat the story. She reports also her mother is more sedentary She is on Namenda 20 and Aricept 10 mg  CT head in 2016 with mild atrophy daughter does not wish to pursue repeat imaging at this time   4. Right knee pain with moderate arthritis her mother has not complained of knee pain for need for f/u     ROS: See pertinent positives and negatives per HPI.  Past Medical History:  Diagnosis Date  . Allergy   . Alzheimer's dementia (HCC)    CT head 08/25/15 mild cortical atrophy and chronic small vessel ischemic change. atheroscloerosis. paranasal sinus disease   . Anosmia   . Asthma   . Chicken pox   . COPD (chronic obstructive  pulmonary disease) (HCC)   . Emphysema of lung (HCC)    per daughter never smoker exposed 2nd have via husband  . Hard of hearing    since childhood  . Hypertension   . Nasal polyps    surgery x 15 years ago and in ~2013 as of 10/08/17  . Syncope    03/2017 [facility] 2[facility]   . UTI (urinary tract infection)     Past Surgical History:  Procedure Laterality Date  . ABDOMINAL HYSTERECTOMY     ? year ? reason ; partial   . BREAST BIOPSY     ? year   . nasal polyps removed      Family History  Problem Relation Age of Onset  . Cancer Mother        ? type  . Other Mother        scarlett fever, skin ulcer, died of complications after MVA   . Arthritis Mother   . Alcohol abuse Paternal Uncle   . 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  aspirin EC 81 MG tablet, Take 81 mg by mouth daily., Disp: , Rfl:  .  cetirizine (ZYRTEC)  10 MG tablet, Take 1 tablet (10 mg total) by mouth daily., Disp: 90 tablet, Rfl: 3 .  donepezil (ARICEPT) 10 MG tablet, Take 1 tablet (10 mg total) by mouth at bedtime., Disp: 90 tablet, Rfl: 3 .  Fluticasone-Umeclidin-Vilant (TRELEGY ELLIPTA) 100-62.5-25 MCG/INH AEPB, Inhale into the lungs., Disp: , Rfl:  .  FLUZONE HIGH-DOSE 0.5 ML injection, TO BE ADMINISTERED BY PHARMACIST FOR IMMUNIZATION, Disp: , Rfl: 0 .  ipratropium-albuterol (DUONEB) 0.5-2.5 (3) MG/3ML SOLN, Take 3 mLs by nebulization 3 (three) times daily as needed (cough, wheezing, sob)., Disp: 360 mL, Rfl: 11 .  losartan (COZAAR) 25 MG tablet, Take 1 tablet (25 mg total) by mouth daily., Disp: 90 tablet, Rfl: 3 .  memantine (NAMENDA) 10 MG tablet, Take 1 tablet (10 mg total) by mouth 2 (two) times daily., Disp: 180 tablet, Rfl: 3 .  mupirocin ointment (BACTROBAN) 2 %,  Apply 1 application topically 2 (two) times daily., Disp: 30 g, Rfl: 1 .  PREVNAR 13 SUSP injection, TO BE ADMINISTERED BY PHARMACIST FOR IMMUNIZATION, Disp: , Rfl: 0  EXAM: did not speak with patient today only her daughter physical exam unable to do   ASSESSMENT AND PLAN:  Discussed the following assessment and plan:  Acute cystitis without hematuria - recent uti 02/2019  Plan: Urinalysis, Routine w reflex microscopic, Urine Culture, daughter will come pick up culture 04/18/2019   Ingrown toenail of right foot - Plan: mupirocin ointment (BACTROBAN) 2 %, given info rec warm soaks  Ingrown right big toenail - Plan: mupirocin ointment (BACTROBAN) 2 %  Dementia with c/w depression  -consider celexa vs remeron low dose daughter will let me know  -also could consider increase aricept 10 mg to 23 mg max dose and namenda 10 mg bid  -could consider B12 in future check serum daughter declines MRI to w/u further   Right knee arthritis not bothersome for now ntd   HM Had [id number] prevnar 08/11/17. Tdap utd shingrix consider in future  pna 23had 07/15/18 Possibly had zostavx 09/2014  Dermatology due for repeat f/u 01/2019 but resch due to pandemic covid 19  Eye Dr. Gertie Baron appt seen and reported constricted pupils but vision ok   09/02/14 DEXA reviewed +osteopenia ? If ever had colonoscopy, ? Last mammogram, s/p hysterectomyOut of age window all   I discussed the assessment and treatment plan with the patient. The patient was provided an opportunity to ask questions and all were answered. The patient agreed with the plan and demonstrated an understanding of the instructions.   The patient was advised to call back or seek an in-person evaluation if the symptoms worsen or if the condition fails to improve as anticipated.  Time spent 15 minutes  Delorise Jackson, MD

## 2019-04-21 ENCOUNTER — Ambulatory Visit: Payer: Medicare Other | Admitting: Podiatry

## 2019-04-21 ENCOUNTER — Other Ambulatory Visit: Payer: Medicare Other

## 2019-04-21 ENCOUNTER — Other Ambulatory Visit: Payer: Self-pay

## 2019-04-21 ENCOUNTER — Encounter: Payer: Self-pay | Admitting: Internal Medicine

## 2019-04-21 ENCOUNTER — Encounter: Payer: Self-pay | Admitting: Podiatry

## 2019-04-21 DIAGNOSIS — N3000 Acute cystitis without hematuria: Secondary | ICD-10-CM

## 2019-04-21 DIAGNOSIS — L603 Nail dystrophy: Secondary | ICD-10-CM | POA: Diagnosis not present

## 2019-04-21 NOTE — Progress Notes (Signed)
This patient presents to the office with her daughter for an evaluation of her big toenail right foot.  This patient has dementia and is hard of hearing.  The daughter says that she was looking at her feet and she realized that the big toenail was red and swollen with drainage present under the nail about a week ago.  She says she mentioned it to her PCP who told her to make an appointment with this office for an evaluation of the nail infection.  Her medical visit with the doctor was a virtual visit on 04/17/2019.  The daughter has been soaking the foot for the last few days and presents the office today for an evaluation and treatment.  Vascular  Dorsalis pedis and posterior tibial pulses are palpable  B/L.  Capillary return  WNL.  Temperature gradient is  WNL.  Skin turgor  WNL  Sensorium deferred due to her hearing problem and dementia..  Nail Exam  Her right hallux toenail is separated from nail bed except at proximal nail fold.  The medial border is disfigured and discolored cith no evidence of drainage or infection.  Orthopedic  Exam  Muscle tone and muscle strength  WNL.  No limitations of motion feet  B/L.  No crepitus or joint effusion noted.  Foot type is unremarkable and digits show no abnormalities.  Bony prominences are unremarkable.  Skin  No open lesions.  Normal skin texture and turgor.  Nail Dystrophy right hallux.  IE.  Debride right hallux nail.  Told daughter to soak toe in soapy water.  Neosporin/DSD.  Told the daughter this should be able to now heal now that the unattached portion of the nail plate was removed.  Patient to return to the office if problems persist.   Gardiner Barefoot DPM

## 2019-04-21 NOTE — Progress Notes (Signed)
Pre visit review using our clinic review tool, if applicable. No additional management support is needed unless otherwise documented below in the visit note. 

## 2019-04-22 ENCOUNTER — Other Ambulatory Visit: Payer: Self-pay | Admitting: Internal Medicine

## 2019-04-22 DIAGNOSIS — F32A Depression, unspecified: Secondary | ICD-10-CM

## 2019-04-22 DIAGNOSIS — F329 Major depressive disorder, single episode, unspecified: Secondary | ICD-10-CM

## 2019-04-22 LAB — URINALYSIS, ROUTINE W REFLEX MICROSCOPIC
Bacteria, UA: NONE SEEN /HPF
Bilirubin Urine: NEGATIVE
Glucose, UA: NEGATIVE
Hgb urine dipstick: NEGATIVE
Hyaline Cast: NONE SEEN /LPF
Nitrite: NEGATIVE
Protein, ur: NEGATIVE
Specific Gravity, Urine: 1.022 (ref 1.001–1.03)
pH: <=5 (ref 5.0–8.0)

## 2019-04-22 LAB — URINE CULTURE
MICRO NUMBER:: 569719
SPECIMEN QUALITY:: ADEQUATE

## 2019-04-22 MED ORDER — MIRTAZAPINE 7.5 MG PO TABS: 7.5000 mg | ORAL_TABLET | Freq: Every day | ORAL | 2 refills | Status: DC

## 2019-05-14 ENCOUNTER — Other Ambulatory Visit: Payer: Self-pay | Admitting: Internal Medicine

## 2019-05-14 DIAGNOSIS — F32A Depression, unspecified: Secondary | ICD-10-CM

## 2019-05-14 DIAGNOSIS — F329 Major depressive disorder, single episode, unspecified: Secondary | ICD-10-CM

## 2019-05-16 ENCOUNTER — Other Ambulatory Visit: Payer: Self-pay | Admitting: Internal Medicine

## 2019-05-16 DIAGNOSIS — F32A Depression, unspecified: Secondary | ICD-10-CM

## 2019-05-16 DIAGNOSIS — F329 Major depressive disorder, single episode, unspecified: Secondary | ICD-10-CM

## 2019-05-16 MED ORDER — MIRTAZAPINE 7.5 MG PO TABS: 7.5000 mg | ORAL_TABLET | Freq: Every day | ORAL | 3 refills | Status: DC

## 2019-05-20 ENCOUNTER — Other Ambulatory Visit: Payer: Self-pay | Admitting: Internal Medicine

## 2019-05-20 DIAGNOSIS — F0390 Unspecified dementia without behavioral disturbance: Secondary | ICD-10-CM

## 2019-05-20 MED ORDER — DONEPEZIL HCL 10 MG PO TABS: 10.0000 mg | ORAL_TABLET | Freq: Every day | ORAL | 3 refills | Status: DC

## 2019-07-23 ENCOUNTER — Other Ambulatory Visit: Payer: Self-pay

## 2019-07-25 ENCOUNTER — Ambulatory Visit (INDEPENDENT_AMBULATORY_CARE_PROVIDER_SITE_OTHER): Payer: Medicare Other

## 2019-07-25 ENCOUNTER — Ambulatory Visit: Payer: Medicare Other | Admitting: Internal Medicine

## 2019-07-25 ENCOUNTER — Encounter: Payer: Self-pay | Admitting: Internal Medicine

## 2019-07-25 ENCOUNTER — Other Ambulatory Visit: Payer: Self-pay

## 2019-07-25 VITALS — BP 126/84 | HR 64 | Temp 98.2°F | Ht 67.5 in | Wt 156.2 lb

## 2019-07-25 DIAGNOSIS — N390 Urinary tract infection, site not specified: Secondary | ICD-10-CM | POA: Diagnosis not present

## 2019-07-25 DIAGNOSIS — I358 Other nonrheumatic aortic valve disorders: Secondary | ICD-10-CM

## 2019-07-25 DIAGNOSIS — R062 Wheezing: Secondary | ICD-10-CM

## 2019-07-25 DIAGNOSIS — J449 Chronic obstructive pulmonary disease, unspecified: Secondary | ICD-10-CM | POA: Diagnosis not present

## 2019-07-25 DIAGNOSIS — L821 Other seborrheic keratosis: Secondary | ICD-10-CM | POA: Insufficient documentation

## 2019-07-25 DIAGNOSIS — R232 Flushing: Secondary | ICD-10-CM | POA: Diagnosis not present

## 2019-07-25 DIAGNOSIS — I10 Essential (primary) hypertension: Secondary | ICD-10-CM | POA: Diagnosis not present

## 2019-07-25 LAB — CBC WITH DIFFERENTIAL/PLATELET
Basophils Absolute: 0.1 10*3/uL (ref 0.0–0.1)
Basophils Relative: 0.6 % (ref 0.0–3.0)
Eosinophils Absolute: 0.7 10*3/uL (ref 0.0–0.7)
Eosinophils Relative: 7.7 % — ABNORMAL HIGH (ref 0.0–5.0)
HCT: 42.5 % (ref 36.0–46.0)
Hemoglobin: 13.9 g/dL (ref 12.0–15.0)
Lymphocytes Relative: 21.4 % (ref 12.0–46.0)
Lymphs Abs: 1.9 10*3/uL (ref 0.7–4.0)
MCHC: 32.7 g/dL (ref 30.0–36.0)
MCV: 93.7 fl (ref 78.0–100.0)
Monocytes Absolute: 0.7 10*3/uL (ref 0.1–1.0)
Monocytes Relative: 8.2 % (ref 3.0–12.0)
Neutro Abs: 5.6 10*3/uL (ref 1.4–7.7)
Neutrophils Relative %: 62.1 % (ref 43.0–77.0)
Platelets: 139 10*3/uL — ABNORMAL LOW (ref 150.0–400.0)
RBC: 4.53 Mil/uL (ref 3.87–5.11)
RDW: 14.8 % (ref 11.5–15.5)
WBC: 9 10*3/uL (ref 4.0–10.5)

## 2019-07-25 LAB — COMPREHENSIVE METABOLIC PANEL
ALT: 13 U/L (ref 0–35)
AST: 14 U/L (ref 0–37)
Albumin: 3.7 g/dL (ref 3.5–5.2)
Alkaline Phosphatase: 75 U/L (ref 39–117)
BUN: 26 mg/dL — ABNORMAL HIGH (ref 6–23)
CO2: 25 mEq/L (ref 19–32)
Calcium: 9.2 mg/dL (ref 8.4–10.5)
Chloride: 107 mEq/L (ref 96–112)
Creatinine, Ser: 1.15 mg/dL (ref 0.40–1.20)
GFR: 43.9 mL/min — ABNORMAL LOW (ref >60.00)
Glucose, Bld: 68 mg/dL — ABNORMAL LOW (ref 70–99)
Potassium: 4.5 mEq/L (ref 3.5–5.1)
Sodium: 141 mEq/L (ref 135–145)
Total Bilirubin: 0.4 mg/dL (ref 0.2–1.2)
Total Protein: 6.6 g/dL (ref 6.0–8.3)

## 2019-07-25 NOTE — Patient Instructions (Addendum)
cetaphil or cerave cream     Seborrheic Keratosis A seborrheic keratosis is a common, noncancerous (benign) skin growth. These growths are velvety, waxy, rough, tan, brown, or black spots that appear on the skin. These skin growths can be flat or raised, and scaly. What are the causes? The cause of this condition is not known. What increases the risk? You are more likely to develop this condition if you:  Have a family history of seborrheic keratosis.  Are 50 or older.  Are pregnant.  Have had estrogen replacement therapy. What are the signs or symptoms? Symptoms of this condition include growths on the face, chest, shoulders, back, or other areas. These growths:  Are usually painless, but may become irritated and itchy.  Can be yellow, brown, black, or other colors.  Are slightly raised or have a flat surface.  Are sometimes rough or wart-like in texture.  Are often velvety or waxy on the surface.  Are round or oval-shaped.  Often occur in groups, but may occur as a single growth. How is this diagnosed? This condition is diagnosed with a medical history and physical exam.  A sample of the growth may be tested (skin biopsy).  You may need to see a skin specialist (dermatologist). How is this treated? Treatment is not usually needed for this condition, unless the growths are irritated or bleed often.  You may also choose to have the growths removed if you do not like their appearance. ? Most commonly, these growths are treated with a procedure in which liquid nitrogen is applied to "freeze" off the growth (cryosurgery). ? They may also be burned off with electricity (electrocautery) or removed by scraping (curettage). Follow these instructions at home:  Watch your growth for any changes.  Keep all follow-up visits as told by your health care provider. This is important.  Do not scratch or pick at the growth or growths. This can cause them to become irritated or  infected. Contact a health care provider if:  You suddenly have many new growths.  Your growth bleeds, itches, or hurts.  Your growth suddenly becomes larger or changes color. Summary  A seborrheic keratosis is a common, noncancerous (benign) skin growth.  Treatment is not usually needed for this condition, unless the growths are irritated or bleed often.  Watch your growth for any changes.  Contact a health care provider if you suddenly have many new growths or your growth suddenly becomes larger or changes color.  Keep all follow-up visits as told by your health care provider. This is important. This information is not intended to replace advice given to you by your health care provider. Make sure you discuss any questions you have with your health care provider. Document Released: 11/25/2010 Document Revised: 03/07/2018 Document Reviewed: 03/07/2018 Elsevier Patient Education  2020 [employer].

## 2019-07-25 NOTE — Progress Notes (Signed)
Chief Complaint  Patient presents with  . Follow-up   F/u with daughter  1. C/o hot flashes and being cold x 1 week no sob but she has been sweating reviewed medications remeron has side effect <1% chills but not hot flashes and TSH normal 12/2018  2. Left sided face lesion getting darker reviewed and SK not worrisome  3. Dementia with mood changes and lack of self care and needs encouragement at times but daughter does not want to overstep boundaries    Review of Systems  Constitutional: Negative for weight loss.  HENT: Negative for hearing loss.   Eyes: Negative for blurred vision.  Respiratory: Negative for shortness of breath.   Cardiovascular: Negative for chest pain.  Gastrointestinal: Negative for abdominal pain.  Genitourinary:       +hot flashes    Skin: Negative for rash.  Neurological: Negative for headaches.  Psychiatric/Behavioral: Positive for memory loss. Negative for depression.   Past Medical History:  Diagnosis Date  . Allergy   . Alzheimer's dementia (HCC)    CT head 08/25/15 mild cortical atrophy and chronic small vessel ischemic change. atheroscloerosis. paranasal sinus disease   . Anosmia   . Asthma   . Chicken pox   . COPD (chronic obstructive pulmonary disease) (HCC)   . Emphysema of lung (HCC)    per daughter never smoker exposed 2nd have via husband  . Hard of hearing    since childhood  . Hypertension   . Nasal polyps    surgery x 15 years ago and in ~2013 as of 10/08/17  . Syncope    03/2017 thought 2/2 diuretic [facility]   . UTI (urinary tract infection)    Past Surgical History:  Procedure Laterality Date  . ABDOMINAL HYSTERECTOMY     ? year ? reason ; partial   . BREAST BIOPSY     ? year   . nasal polyps removed     Family History  Problem Relation Age of Onset  . Cancer Mother        ? type  . Other Mother        scarlett fever, skin ulcer, died of complications after MVA   . Arthritis Mother   . Alcohol abuse  Paternal Uncle   . Arthritis Father    Social History   Socioeconomic History  . Marital status: Widowed    Spouse name: Not on file  . Number of children: Not on file  . Years of education: Not on file  . Highest education level: Not on file  Occupational History  . Not on file  Social Needs  . Financial resource strain: Not on file  . Food insecurity    Worry: Not on file    Inability: Not on file  . Transportation needs    Medical: Not on file    Non-medical: Not on file  Tobacco Use  . Smoking status: Former Games developer  . Smokeless tobacco: Never Used  Substance and Sexual Activity  . Alcohol use: No  . Drug use: No  . Sexual activity: Never  Lifestyle  . Physical activity    Days per week: Not on file    Minutes per session: Not on file  . Stress: Not on file  Relationships  . Social Musician on phone: Not on file    Gets together: Not on file    Attends religious service: Not on file    Active member of club  or organization: Not on file    Attends meetings of clubs or organizations: Not on file    Relationship status: Not on file  . Intimate partner violence    Fear of current or ex partner: Not on file    Emotionally abused: Not on file    Physically abused: Not on file    Forced sexual activity: Not on file  Other Topics Concern  . 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 aspirin EC 81 MG tablet Take 81 mg by mouth daily.  . cetirizine (ZYRTEC) 10 MG tablet Take 1 tablet (10 mg total) by mouth daily.  Marland Kitchen. donepezil (ARICEPT) 10 MG tablet Take 1 tablet (10 mg total) by mouth at bedtime.  . Fluticasone-Umeclidin-Vilant (TRELEGY ELLIPTA) 100-62.5-25 MCG/INH AEPB Inhale into the lungs.  Marland Kitchen. FLUZONE HIGH-DOSE 0.5 ML injection TO BE  ADMINISTERED BY PHARMACIST FOR IMMUNIZATION  . losartan (COZAAR) 25 MG tablet Take 1 tablet (25 mg total) by mouth daily.  . memantine (NAMENDA) 10 MG tablet Take 1 tablet (10 mg total) by mouth 2 (two) times daily.  . mirtazapine (REMERON) 7.5 MG tablet Take 1 tablet (7.5 mg total) by mouth at bedtime.  . mupirocin ointment (BACTROBAN) 2 % Apply 1 application topically 2 (two) times daily.  Marland Kitchen. PREVNAR 13 SUSP injection TO BE ADMINISTERED BY PHARMACIST FOR IMMUNIZATION   Allergies  Allergen Reactions  . Latex     rash   No results found for this or any previous visit (from the past 2160 hour(s)). Objective  Body mass index is 24.1 kg/m. Wt Readings from Last 3 Encounters:  07/25/19 156 lb 3.2 oz (70.9 kg)  12/17/18 156 lb 4.8 oz (70.9 kg)  09/13/18 150 lb 8 oz (68.3 kg)   Temp Readings from Last 3 Encounters:  07/25/19 98.2 F (36.8 C) (Oral)  04/21/19 (!) 96.4 F (35.8 C)  12/17/18 98.3 F (36.8 C) (Oral)   BP Readings from Last 3 Encounters:  07/25/19 126/84  12/17/18 140/62  09/13/18 (!) 122/58   Pulse Readings from Last 3 Encounters:  07/25/19 64  12/17/18 67  09/13/18 64    Physical Exam Vitals signs and nursing note reviewed.  Constitutional:      Appearance: Normal appearance. She is well-developed and well-groomed.  HENT:     Head: Normocephalic and atraumatic.     Comments: +mask  Cardiovascular:     Rate and Rhythm: Normal rate and regular rhythm.     Heart sounds: Murmur present.  Pulmonary:     Effort: Pulmonary effort is normal.     Breath sounds: Wheezing present.  Skin:    General: Skin is warm and dry.       Neurological:     General: No focal deficit present.     Mental Status: She is alert and oriented to person, place, and time. Mental status is at baseline.     Gait: Gait normal.  Psychiatric:        Attention and Perception: Attention and perception normal.        Mood and Affect: Mood and affect normal.        Speech: Speech normal.  Behavior: Behavior is cooperative.        Thought Content: Thought content normal.        Cognition and Memory: Cognition and memory normal.        Judgment: Judgment normal.     Assessment  Plan  Urinary tract infection without hematuria, site unspecified - Plan: CBC with Differential/Platelet, Urinalysis, Routine w reflex microscopic, Urine Culture  Hot flashes - Plan: Comprehensive metabolic panel, CBC with Differential/Platelet, DG Chest 2 View -if neg w/u with urine and blood consider other etiology and further w/u   Wheezing - Plan: DG Chest 2 View  Chronic obstructive pulmonary disease, unspecified COPD type ([place]) - Plan: DG Chest 2 View  Aortic valve sclerosis -consider repeat echo in future last was in 2016   Essential hypertension - Plan: Comprehensive metabolic panel, CBC with Differential/Platelet  HTN BP controlled   HM  Had [id number] prevnar 08/11/17. Tdap utd shingrixconsider in future pna 23had 07/15/18 Possibly had zostavx 09/2014  Dermatologydue for repeat f/u 01/2019 but resch due to pandemic covid 19 as of 07/2019 has not reschedule    Eye Dr. Gertie Baron appt seen and reported constricted pupils but vision ok   09/02/14 DEXA reviewed +osteopenia ? If ever had colonoscopy, ? Last mammogram, s/p hysterectomyOut of age window all   Provider: Dr. Olivia Mackie McLean-Scocuzza-Internal Medicine

## 2019-07-27 LAB — URINALYSIS, ROUTINE W REFLEX MICROSCOPIC
Bilirubin Urine: NEGATIVE
Glucose, UA: NEGATIVE
Hgb urine dipstick: NEGATIVE
Hyaline Cast: NONE SEEN /LPF
Ketones, ur: NEGATIVE
Nitrite: NEGATIVE
Protein, ur: NEGATIVE
RBC / HPF: NONE SEEN /HPF (ref 0–2)
Specific Gravity, Urine: 1.014 (ref 1.001–1.03)
pH: <=5 (ref 5.0–8.0)

## 2019-07-27 LAB — URINE CULTURE
MICRO NUMBER:: 898076
SPECIMEN QUALITY:: ADEQUATE

## 2019-07-28 ENCOUNTER — Other Ambulatory Visit: Payer: Self-pay | Admitting: Internal Medicine

## 2019-07-28 ENCOUNTER — Encounter: Payer: Self-pay | Admitting: Internal Medicine

## 2019-07-28 DIAGNOSIS — N3000 Acute cystitis without hematuria: Secondary | ICD-10-CM

## 2019-07-28 MED ORDER — SULFAMETHOXAZOLE-TRIMETHOPRIM 400-80 MG PO TABS: 1.0000 | ORAL_TABLET | Freq: Two times a day (BID) | ORAL | 0 refills | Status: DC

## 2019-09-24 ENCOUNTER — Encounter: Payer: Self-pay | Admitting: Internal Medicine

## 2019-09-24 DIAGNOSIS — J441 Chronic obstructive pulmonary disease with (acute) exacerbation: Secondary | ICD-10-CM

## 2019-09-25 ENCOUNTER — Other Ambulatory Visit: Payer: Self-pay | Admitting: Internal Medicine

## 2019-09-25 DIAGNOSIS — J441 Chronic obstructive pulmonary disease with (acute) exacerbation: Secondary | ICD-10-CM

## 2019-09-25 MED ORDER — TRELEGY ELLIPTA 100-62.5-25 MCG/INH IN AEPB: 1.0000 | INHALATION_SPRAY | Freq: Every day | RESPIRATORY_TRACT | 11 refills | Status: DC

## 2019-10-23 ENCOUNTER — Other Ambulatory Visit: Payer: Self-pay | Admitting: Internal Medicine

## 2019-10-23 DIAGNOSIS — J4520 Mild intermittent asthma, uncomplicated: Secondary | ICD-10-CM

## 2019-10-23 DIAGNOSIS — I10 Essential (primary) hypertension: Secondary | ICD-10-CM

## 2019-10-23 MED ORDER — CETIRIZINE HCL 10 MG PO TABS: 10.0000 mg | ORAL_TABLET | Freq: Every day | ORAL | 3 refills | Status: DC

## 2019-10-23 MED ORDER — LOSARTAN POTASSIUM 25 MG PO TABS: 25.0000 mg | ORAL_TABLET | Freq: Every day | ORAL | 3 refills | Status: DC

## 2019-10-24 ENCOUNTER — Encounter: Payer: Self-pay | Admitting: Internal Medicine

## 2019-10-24 ENCOUNTER — Ambulatory Visit (INDEPENDENT_AMBULATORY_CARE_PROVIDER_SITE_OTHER): Payer: Medicare Other | Admitting: Internal Medicine

## 2019-10-24 ENCOUNTER — Other Ambulatory Visit: Payer: Self-pay

## 2019-10-24 VITALS — Ht 67.5 in | Wt 156.2 lb

## 2019-10-24 DIAGNOSIS — E538 Deficiency of other specified B group vitamins: Secondary | ICD-10-CM

## 2019-10-24 DIAGNOSIS — L603 Nail dystrophy: Secondary | ICD-10-CM

## 2019-10-24 DIAGNOSIS — Z1329 Encounter for screening for other suspected endocrine disorder: Secondary | ICD-10-CM

## 2019-10-24 DIAGNOSIS — R7303 Prediabetes: Secondary | ICD-10-CM

## 2019-10-24 DIAGNOSIS — J449 Chronic obstructive pulmonary disease, unspecified: Secondary | ICD-10-CM | POA: Diagnosis not present

## 2019-10-24 DIAGNOSIS — N3000 Acute cystitis without hematuria: Secondary | ICD-10-CM

## 2019-10-24 DIAGNOSIS — F0390 Unspecified dementia without behavioral disturbance: Secondary | ICD-10-CM | POA: Diagnosis not present

## 2019-10-24 DIAGNOSIS — M1990 Unspecified osteoarthritis, unspecified site: Secondary | ICD-10-CM | POA: Diagnosis not present

## 2019-10-24 DIAGNOSIS — I10 Essential (primary) hypertension: Secondary | ICD-10-CM

## 2019-10-24 NOTE — Progress Notes (Signed)
Virtual Visit via Video Note  I connected with Makayla [name]  on 10/24/19 at  4:06 PM EST by a video enabled telemedicine application and verified that I am speaking with the correct person using two identifiers.  Location patient: home Location provider:work or home office Persons participating in the virtual visit: patient, provider, Brayton Layman daughter and pts grandson  I discussed the limitations of evaluation and management by telemedicine and the availability of in person appointments. The patient expressed understanding and agreed to proceed.   HPI: 1. Dementia with behavioral disturbance h/o depressed/variable mood and insomnia but doing well on remeron 7.5 mg qd. Appetite has increased, mood better and sleeping well 2. COPD cough comes and goes on Trelegy and using this per daughter doing well  3. Denies abdominal pain, burning with urination, joint pain  4. Right nail dystrophy saw Dr. Stacie Acres 04/21/2019. Daughter reports 1/2 of toenail on the right is not attached but no longer sore and wants to hold on podiatry referral  5. Right knee with arthritis on prior Xray but no current knee pain    ROS: See pertinent positives and negatives per HPI.  Past Medical History:  Diagnosis Date  . Allergy   . Alzheimer's dementia (HCC)    CT head 08/25/15 mild cortical atrophy and chronic small vessel ischemic change. atheroscloerosis. paranasal sinus disease   . Anosmia   . Asthma   . Chicken pox   . COPD (chronic obstructive pulmonary disease) (HCC)   . Emphysema of lung (HCC)    per daughter never smoker exposed 2nd have via husband  . Hard of hearing    since childhood  . Hypertension   . Nasal polyps    surgery x 15 years ago and in ~2013 as of 10/08/17  . Syncope    03/2017 thought 2/2 diuretic [facility]   . UTI (urinary tract infection)     Past Surgical History:  Procedure Laterality Date  . ABDOMINAL HYSTERECTOMY     ? year ? reason ; partial   . BREAST BIOPSY      ? year   . nasal polyps removed      Family History  Problem Relation Age of Onset  . Cancer Mother        ? type  . Other Mother        scarlett fever, skin ulcer, died of complications after MVA   . Arthritis Mother   . Alcohol abuse Paternal Uncle   . 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  aspirin EC 81 MG tablet, Take 81 mg by mouth daily., Disp: , Rfl:  .  cetirizine (ZYRTEC) 10 MG tablet, Take 1 tablet (10 mg total) by mouth daily., Disp: 90 tablet, Rfl: 3 .  donepezil (ARICEPT) 10 MG tablet, Take 1 tablet (10 mg total) by mouth at bedtime., Disp: 90 tablet, Rfl: 3 .  Fluticasone-Umeclidin-Vilant (TRELEGY ELLIPTA) 100-62.5-25 MCG/INH AEPB, Inhale 1 puff into the lungs daily. Rinse mouth after use, Disp: 60 each, Rfl: 11 .  FLUZONE HIGH-DOSE 0.5 ML injection, TO BE ADMINISTERED BY PHARMACIST FOR IMMUNIZATION, Disp: , Rfl: 0 .  losartan (COZAAR) 25 MG tablet, Take 1 tablet (25  mg total) by mouth daily., Disp: 90 tablet, Rfl: 3 .  memantine (NAMENDA) 10 MG tablet, Take 1 tablet (10 mg total) by mouth 2 (two) times daily., Disp: 180 tablet, Rfl: 3 .  mirtazapine (REMERON) 7.5 MG tablet, Take 1 tablet (7.5 mg total) by mouth at bedtime., Disp: 90 tablet, Rfl: 3 .  mupirocin ointment (BACTROBAN) 2 %, Apply 1 application topically 2 (two) times daily., Disp: 30 g, Rfl: 1 .  PREVNAR 13 SUSP injection, TO BE ADMINISTERED BY PHARMACIST FOR IMMUNIZATION, Disp: , Rfl: 0  EXAM:  VITALS per patient if applicable:  GENERAL: alert, oriented, appears well and in no acute distress  HEENT: atraumatic, conjunttiva clear, no obvious abnormalities on inspection of external nose and ears  NECK: normal movements of the head and neck  LUNGS:  on inspection no signs of respiratory distress, breathing rate appears normal, no obvious gross SOB, gasping or wheezing  CV: no obvious cyanosis  MS: moves all visible extremities without noticeable abnormality  PSYCH/NEURO: pleasant and cooperative, no obvious depression or anxiety, speech and thought processing grossly intact  ASSESSMENT AND PLAN:  Discussed the following assessment and plan:  Dementia with behavioral disturbance (I.e variable mood and insomnia) -cont meds remeron 7.5 mg qhs seems to be working  Chronic obstructive pulmonary disease, stable -cont meds   Nail dystrophy -hold on f/u Dr. Prudence Davidson for now if worse go to podiatry   Arthritis prior right knee Xray  No pain today   HM Had fluutd prevnar 08/11/17. Tdap utd shingrixconsider in future pna 23had 07/15/18 Possibly had zostavx 09/2014  Dermatologydue for repeat f/u 01/2019 but no had due to pandemic covid 19 as of 10/24/2019  Eye Dr. Jacinto Reap Nice appt seen and reported constricted pupils but vision ok-previous appt   09/02/14 DEXA reviewed +osteopenia ? If ever had colonoscopy, ? Last mammogram, s/p hysterectomyOut of age window for all health maintenance   -we discussed possible serious and likely etiologies, options for evaluation and workup, limitations of telemedicine visit vs in person visit, treatment, treatment risks and precautions. Pt prefers to treat via telemedicine empirically rather then risking or undertaking an in person visit at this moment. Patient agrees to seek prompt in person care if worsening, new symptoms arise, or if is not improving with treatment.   I discussed the assessment and treatment plan with the patient. The patient was provided an opportunity to ask questions and all were answered. The patient agreed with the plan and demonstrated an understanding of the instructions.   The patient was advised to call back or seek an in-person evaluation if the symptoms worsen or if  the condition fails to improve as anticipated.  Time spent 15-20 minutes Delorise Jackson, MD

## 2019-12-11 ENCOUNTER — Encounter: Payer: Self-pay | Admitting: Internal Medicine

## 2019-12-11 ENCOUNTER — Other Ambulatory Visit: Payer: Self-pay

## 2019-12-11 ENCOUNTER — Telehealth: Payer: Self-pay | Admitting: Internal Medicine

## 2019-12-11 ENCOUNTER — Other Ambulatory Visit: Payer: Self-pay | Admitting: Internal Medicine

## 2019-12-11 DIAGNOSIS — N3000 Acute cystitis without hematuria: Secondary | ICD-10-CM

## 2019-12-11 NOTE — Telephone Encounter (Signed)
I spoke with patient's daughter & she cannot bring urine back today. She will be bringing it mid-morning or sometime before lunch tomorrow. That is when caregiver will be there & can help get specimen.

## 2019-12-11 NOTE — Telephone Encounter (Signed)
Pt needs to leave a urine and be put on lab schedule for urine  Someone is coming for a hat, urine cup and gloves Can we prepare for them and have ready?  pts daughter will drop urine back off I believe   Thank you   TMS

## 2019-12-11 NOTE — Telephone Encounter (Signed)
Pt's daughter picked up items

## 2019-12-11 NOTE — Telephone Encounter (Signed)
Patient's daughter is having her friend come pick up cup as well as hat for patient. She was screened & no covid exposure nor has been tested in last 21 days. I am putting cup upfront. I advised that patient's daughter needed to send Dr. French Ana a detailed mychart message so they could have a consult since no appointment available.

## 2019-12-11 NOTE — Telephone Encounter (Signed)
Pt needs lab appt urine today  12/11/19  TMS

## 2019-12-11 NOTE — Telephone Encounter (Signed)
Pt daughter called and said that she thinks her mother has a UTI and would like to have a visit or drop off urine

## 2019-12-11 NOTE — Telephone Encounter (Signed)
Placed up front for pickup ° °

## 2019-12-12 ENCOUNTER — Other Ambulatory Visit: Payer: Self-pay | Admitting: Internal Medicine

## 2019-12-12 ENCOUNTER — Encounter: Payer: Self-pay | Admitting: Internal Medicine

## 2019-12-12 ENCOUNTER — Other Ambulatory Visit: Payer: Self-pay

## 2019-12-12 ENCOUNTER — Other Ambulatory Visit (INDEPENDENT_AMBULATORY_CARE_PROVIDER_SITE_OTHER): Payer: Medicare PPO

## 2019-12-12 DIAGNOSIS — N3000 Acute cystitis without hematuria: Secondary | ICD-10-CM | POA: Diagnosis not present

## 2019-12-12 MED ORDER — CIPROFLOXACIN HCL 500 MG PO TABS: 500.0000 mg | ORAL_TABLET | Freq: Two times a day (BID) | ORAL | 0 refills | Status: DC

## 2019-12-14 LAB — URINE CULTURE
MICRO NUMBER:: 10121358
SPECIMEN QUALITY:: ADEQUATE

## 2019-12-14 LAB — URINALYSIS, ROUTINE W REFLEX MICROSCOPIC
Bilirubin Urine: NEGATIVE
Glucose, UA: NEGATIVE
Hgb urine dipstick: NEGATIVE
Hyaline Cast: NONE SEEN /LPF
Ketones, ur: NEGATIVE
Leukocytes,Ua: NEGATIVE
Nitrite: POSITIVE — AB
Protein, ur: NEGATIVE
RBC / HPF: NONE SEEN /HPF (ref 0–2)
Specific Gravity, Urine: 1.012 (ref 1.001–1.03)
Squamous Epithelial / HPF: NONE SEEN /HPF (ref <=5)
WBC, UA: NONE SEEN /HPF (ref 0–5)
pH: 6 (ref 5.0–8.0)

## 2019-12-15 ENCOUNTER — Telehealth (INDEPENDENT_AMBULATORY_CARE_PROVIDER_SITE_OTHER): Payer: Medicare PPO | Admitting: Internal Medicine

## 2019-12-15 DIAGNOSIS — N3000 Acute cystitis without hematuria: Secondary | ICD-10-CM

## 2019-12-15 NOTE — Telephone Encounter (Signed)
My chart message   Ok let me know when you all pick up the supplies  It takes 2 days to get results do you want to start an antibiotic now?   TMS  This MyChart message has not been read. Edeline, Greening "Skippy" to Me     12/11/19 10:27 AM Her mood has changed dramatically. The caregiver said she was quiet yesterday, didn't want to eat, didn't feel well and quiet. She didn't complain about anything but she never does. Last night, after dinner, she had a complete melt down crying about her orange tree in [place]. I tried to comfort her. She wanted to walk down to [facility] to pick some oranges from the tree. These episodes have historically indicated a UTI. Charge me as you need to. I'll send my friend Diane by to pick up a sample cup and hat. Thank you. Me to Everlene, Cunning "Skippy"   TM  12/11/19 10:19 AM Are you agreeable to my chart consult fee to treat and work up your moms UTI Tell me more about why you think she has UTI  Ok to bring a urine sample  Orders are in    A/P 1. E Coli UTI  tx cipro 12/12/19  Daughter agreeable to my chart fee and tx   TMS  Time 5-10 minute s

## 2020-01-07 ENCOUNTER — Emergency Department
Admission: EM | Admit: 2020-01-07 | Discharge: 2020-01-08 | Disposition: A | Discharge status: Home / Self Care | Payer: Medicare PPO | Attending: Emergency Medicine | Admitting: Emergency Medicine

## 2020-01-07 ENCOUNTER — Other Ambulatory Visit: Payer: Self-pay

## 2020-01-07 ENCOUNTER — Emergency Department: Payer: Medicare PPO

## 2020-01-07 ENCOUNTER — Encounter: Payer: Self-pay | Admitting: Internal Medicine

## 2020-01-07 DIAGNOSIS — I129 Hypertensive chronic kidney disease with stage 1 through stage 4 chronic kidney disease, or unspecified chronic kidney disease: Secondary | ICD-10-CM | POA: Diagnosis not present

## 2020-01-07 DIAGNOSIS — N183 Chronic kidney disease, stage 3 unspecified: Secondary | ICD-10-CM | POA: Diagnosis not present

## 2020-01-07 DIAGNOSIS — I2510 Atherosclerotic heart disease of native coronary artery without angina pectoris: Secondary | ICD-10-CM | POA: Diagnosis not present

## 2020-01-07 DIAGNOSIS — Z7982 Long term (current) use of aspirin: Secondary | ICD-10-CM | POA: Diagnosis not present

## 2020-01-07 DIAGNOSIS — Z79899 Other long term (current) drug therapy: Secondary | ICD-10-CM | POA: Insufficient documentation

## 2020-01-07 DIAGNOSIS — G309 Alzheimer's disease, unspecified: Secondary | ICD-10-CM | POA: Insufficient documentation

## 2020-01-07 DIAGNOSIS — R4689 Other symptoms and signs involving appearance and behavior: Secondary | ICD-10-CM | POA: Diagnosis not present

## 2020-01-07 DIAGNOSIS — K5641 Fecal impaction: Secondary | ICD-10-CM | POA: Diagnosis not present

## 2020-01-07 DIAGNOSIS — Z91040 Latex allergy status: Secondary | ICD-10-CM | POA: Insufficient documentation

## 2020-01-07 DIAGNOSIS — J449 Chronic obstructive pulmonary disease, unspecified: Secondary | ICD-10-CM | POA: Diagnosis not present

## 2020-01-07 DIAGNOSIS — R4182 Altered mental status, unspecified: Secondary | ICD-10-CM | POA: Diagnosis not present

## 2020-01-07 DIAGNOSIS — Z87891 Personal history of nicotine dependence: Secondary | ICD-10-CM | POA: Insufficient documentation

## 2020-01-07 DIAGNOSIS — R410 Disorientation, unspecified: Secondary | ICD-10-CM | POA: Diagnosis not present

## 2020-01-07 DIAGNOSIS — K5900 Constipation, unspecified: Secondary | ICD-10-CM | POA: Diagnosis not present

## 2020-01-07 LAB — COMPREHENSIVE METABOLIC PANEL
ALT: 20 U/L (ref 0–44)
AST: 22 U/L (ref 15–41)
Albumin: 4.1 g/dL (ref 3.5–5.0)
Alkaline Phosphatase: 69 U/L (ref 38–126)
Anion gap: 7 (ref 5–15)
BUN: 29 mg/dL — ABNORMAL HIGH (ref 8–23)
CO2: 27 mmol/L (ref 22–32)
Calcium: 9.1 mg/dL (ref 8.9–10.3)
Chloride: 106 mmol/L (ref 98–111)
Creatinine, Ser: 1.18 mg/dL — ABNORMAL HIGH (ref 0.44–1.00)
GFR calc Af Amer: 46 mL/min — ABNORMAL LOW (ref >60)
GFR calc non Af Amer: 39 mL/min — ABNORMAL LOW (ref >60)
Glucose, Bld: 147 mg/dL — ABNORMAL HIGH (ref 70–99)
Potassium: 5 mmol/L (ref 3.5–5.1)
Sodium: 140 mmol/L (ref 135–145)
Total Bilirubin: 0.9 mg/dL (ref 0.3–1.2)
Total Protein: 7.5 g/dL (ref 6.5–8.1)

## 2020-01-07 LAB — CBC
HCT: 45.4 % (ref 36.0–46.0)
Hemoglobin: 14.7 g/dL (ref 12.0–15.0)
MCH: 31.1 pg (ref 26.0–34.0)
MCHC: 32.4 g/dL (ref 30.0–36.0)
MCV: 96 fL (ref 80.0–100.0)
Platelets: 216 10*3/uL (ref 150–400)
RBC: 4.73 MIL/uL (ref 3.87–5.11)
RDW: 13.2 % (ref 11.5–15.5)
WBC: 12.2 10*3/uL — ABNORMAL HIGH (ref 4.0–10.5)
nRBC: 0 % (ref 0.0–0.2)

## 2020-01-07 MED ORDER — LIDOCAINE VISCOUS HCL 2 % MT SOLN
15.0000 mL | Freq: Once | OROMUCOSAL | Status: AC
  Administered 2020-01-07: 15 mL via OROMUCOSAL
  Filled 2020-01-07: qty 15

## 2020-01-07 MED ORDER — SODIUM CHLORIDE 0.9 % IV BOLUS: 1000.0000 mL | Freq: Once | INTRAVENOUS | Status: DC

## 2020-01-07 NOTE — ED Triage Notes (Signed)
Pt in with co increased confusion and decreased appetite. Does have a recent hx of UTI, was put on cipro. Family thinks she may be constipated, took colace x 2 at 1300. Unsure of last BM, caretaker states she has been going to bathroom several times today without results. Did also take an enema today, has had liquid stools, but no firm stools. Pt stated she did feel constipated, unsure of fever, unsure of dysuria. Pt does have hx of dementia.

## 2020-01-07 NOTE — ED Notes (Signed)
Pt given warm blanket, and resting in bed. Pt appears comfortable, denies any further needs at this time.

## 2020-01-07 NOTE — ED Provider Notes (Signed)
[facility] Emergency Department Provider Note    ____________________________________________   I have reviewed the triage vital signs and the nursing notes.   HISTORY  Chief Complaint Altered Mental Status   History limited by: Dementia. History primarily obtained from family   HPI Makayla [name] is a 86 y.o. female who presents to the emergency department today accompanied by family because of concern for change in behavior as well as constipation. Family has noticed a change in the patient's behavior over the past couple of days.  She has had altered mental status in the past in relationship to urinary tract infections although family felt like her change in behavior was slightly different than they normally observed with urinary tract infections.  When patient was seen with caregiver today she did mention concern for constipation.  They are unsure when the patient last had a bowel movement.  Family did try giving the patient a Fleet enema with minimal liquidy stool evacuated.  They have not noticed any fevers recently.  Not noticed any change in urine.   Records reviewed. Per medical record review patient has a history of dementia.   Past Medical History:  Diagnosis Date  . Allergy   . Alzheimer's dementia (HCC)    CT head 08/25/15 mild cortical atrophy and chronic small vessel ischemic change. atheroscloerosis. paranasal sinus disease   . Anosmia   . Asthma   . Chicken pox   . COPD (chronic obstructive pulmonary disease) (HCC)   . Emphysema of lung (HCC)    per daughter never smoker exposed 2nd have via husband  . Hard of hearing    since childhood  . Hypertension   . Nasal polyps    surgery x 15 years ago and in ~2013 as of 10/08/17  . Syncope    03/2017 thought 2/2 diuretic stopped in [facility]   . UTI (urinary tract infection)     Patient Active Problem List   Diagnosis Date Noted  . Seborrheic keratoses 07/25/2019  . Nail dystrophy  04/21/2019  . Depression 04/17/2019  . AK (actinic keratosis) 05/13/2018  . History of prediabetes 01/07/2018  . Abnormal gait 01/07/2018  . CKD (chronic kidney disease) stage 3, GFR 30-59 ml/min 11/25/2017  . COPD (chronic obstructive pulmonary disease) (HCC) 11/25/2017  . HLD (hyperlipidemia) 11/25/2017  . Arthritis 11/25/2017  . Carotid artery stenosis 11/25/2017  . Diastolic dysfunction 11/25/2017  . Aortic valve sclerosis 11/25/2017  . Essential hypertension 10/08/2017  . Dementia without behavioral disturbance (HCC) 10/08/2017  . Mild intermittent asthma without complication 10/08/2017  . Heart murmur 10/08/2017  . Thrombophlebitis leg 07/19/2016    Past Surgical History:  Procedure Laterality Date  . ABDOMINAL HYSTERECTOMY     ? year ? reason ; partial   . BREAST BIOPSY     ? year   . nasal polyps removed      Prior to Admission medications   Medication Sig Start Date End Date Taking? Authorizing Provider  aspirin EC 81 MG tablet Take 81 mg by mouth daily.    [provider]  cetirizine (ZYRTEC) 10 MG tablet Take 1 tablet (10 mg total) by mouth daily. 10/23/19   McLean-Scocuzza, Pasty Spillers, MD  ciprofloxacin (CIPRO) 500 MG tablet Take 1 tablet (500 mg total) by mouth 2 (two) times daily. With food x 5 days 12/12/19   McLean-Scocuzza, Pasty Spillers, MD  donepezil (ARICEPT) 10 MG tablet Take 1 tablet (10 mg total) by mouth at bedtime. 05/20/19   McLean-Scocuzza,  Nino Glow, MD  Fluticasone-Umeclidin-Vilant (TRELEGY ELLIPTA) 100-62.5-25 MCG/INH AEPB Inhale 1 puff into the lungs daily. Rinse mouth after use 09/25/19   McLean-Scocuzza, Nino Glow, MD  FLUZONE HIGH-DOSE 0.5 ML injection TO BE ADMINISTERED BY PHARMACIST FOR IMMUNIZATION 08/11/17   [provider]  losartan (COZAAR) 25 MG tablet Take 1 tablet (25 mg total) by mouth daily. 10/23/19   McLean-Scocuzza, Nino Glow, MD  memantine (NAMENDA) 10 MG tablet Take 1 tablet (10 mg total) by mouth 2 (two) times daily. 03/11/19    McLean-Scocuzza, Nino Glow, MD  mirtazapine (REMERON) 7.5 MG tablet Take 1 tablet (7.5 mg total) by mouth at bedtime. 05/16/19   McLean-Scocuzza, Nino Glow, MD  mupirocin ointment (BACTROBAN) 2 % Apply 1 application topically 2 (two) times daily. 04/17/19   McLean-Scocuzza, Nino Glow, MD  PREVNAR 13 SUSP injection TO BE ADMINISTERED BY PHARMACIST FOR IMMUNIZATION 08/11/17   [provider]    Allergies Latex and Skin adhesives [cyanoacrylate]  Family History  Problem Relation Age of Onset  . Cancer Mother        ? type  . Other Mother        scarlett fever, skin ulcer, died of complications after MVA   . Arthritis Mother   . Alcohol abuse Paternal Uncle   . Arthritis Father     Social History Social History   Tobacco Use  . Smoking status: Former Research scientist (life sciences)  . Smokeless tobacco: Never Used  Substance Use Topics  . Alcohol use: No  . Drug use: No    Review of Systems Unable to obtain reliable ROS from patient secondary to dementia ____________________________________________   PHYSICAL EXAM:  VITAL SIGNS: ED Triage Vitals  Enc Vitals Group     BP 01/07/20 2026 (!) 159/119     Pulse Rate 01/07/20 2026 85     Resp 01/07/20 2026 20     Temp 01/07/20 2026 97.9 F (36.6 C)     Temp Source 01/07/20 2026 Oral     SpO2 01/07/20 2026 96 %     Weight 01/07/20 2027 150 lb (68 kg)     Height --      Head Circumference --      Peak Flow --      Pain Score 01/07/20 2027 0   Constitutional: Awake and alert.  Eyes: Conjunctivae are normal.  ENT      Head: Normocephalic and atraumatic.      Nose: No congestion/rhinnorhea.      Mouth/Throat: Mucous membranes are moist.      Neck: No stridor. Hematological/Lymphatic/Immunilogical: No cervical lymphadenopathy. Cardiovascular: Normal rate, regular rhythm.  No murmurs, rubs, or gallops.  Respiratory: Normal respiratory effort without tachypnea nor retractions. Breath sounds are clear and equal bilaterally. No  wheezes/rales/rhonchi. Gastrointestinal: Soft and non tender. No rebound. No guarding.  Rectal: Fecal impaction. Musculoskeletal: Normal range of motion in all extremities. No lower extremity edema. Neurologic:  Demented.  Skin:  Skin is warm, dry and intact. No rash noted. ____________________________________________    LABS (pertinent positives/negatives)  CBC wbc 12.2, hgb 14.7, plt 216 CMP wnl except glu 147, bun 29, cr 1.18  ____________________________________________   EKG  None  ____________________________________________    RADIOLOGY  Abd x-ray Large amount of stool. No obstructive bowel gas pattern.  CXR No acute abnormality  ____________________________________________   PROCEDURES  Procedures  ____________________________________________   INITIAL IMPRESSION / ASSESSMENT AND PLAN / ED COURSE  Pertinent labs & imaging results that were available during my care  of the patient were reviewed by me and considered in my medical decision making (see chart for details).   Patient brought in by family because of concerns for change in behavior.  Also concerns for constipation.  On rectal exam patient did have a fecal impaction.  This was disimpacted.  Will give patient enema here to try to further extract stool.  Additionally will check urine given concerns for change in behavior although daughter feels like this is different.  I do think if urine is negative be reasonable for patient be discharged home.  ____________________________________________   FINAL CLINICAL IMPRESSION(S) / ED DIAGNOSES  Final diagnoses:  Constipation, unspecified constipation type  Change in behavior  Fecal impaction [facility])     Note: This dictation was prepared with Dragon dictation. Any transcriptional errors that result from this process are unintentional     Phineas Semen, MD 01/08/20 0009

## 2020-01-08 LAB — URINALYSIS, COMPLETE (UACMP) WITH MICROSCOPIC
Bacteria, UA: NONE SEEN
Bilirubin Urine: NEGATIVE
Glucose, UA: NEGATIVE mg/dL
Hgb urine dipstick: NEGATIVE
Ketones, ur: NEGATIVE mg/dL
Leukocytes,Ua: NEGATIVE
Nitrite: NEGATIVE
Protein, ur: NEGATIVE mg/dL
Specific Gravity, Urine: 1.016 (ref 1.005–1.030)
pH: 5 (ref 5.0–8.0)

## 2020-01-08 MED ORDER — MAGNESIUM CITRATE PO SOLN: 1.0000 | Freq: Once | ORAL | 0 refills | Status: AC

## 2020-01-08 NOTE — Discharge Instructions (Signed)
Please seek medical attention for any high fevers, chest pain, shortness of breath, change in behavior, persistent vomiting, bloody stool or any other new or concerning symptoms.  

## 2020-01-08 NOTE — Telephone Encounter (Signed)
Reviewed ER records.  Pt was manually disimpacted and per note had a large volume bowel movement after disimpacted. Confirm feeling better today.   Would recommend miralax daily.  (if this causes loose stool, can decrease to qod).  Schedule appt to f/u with Dr French Ana and let us know if persistent problems.

## 2020-01-08 NOTE — Telephone Encounter (Signed)
Patient complaining of being constipated as of yesterday. She started Colace and Prune juice. Patient was complaining and sitting on the toilet yesterday as these did not help. Patient taken to the ER last night.  Patient was revealed to be packed with stool as shown through manual exam and x-rays. They manually removed as much stool as they could and patient is now having some bowl movement. Patient placed on one time dose of magnesium citrate solution.   Patient's daughter states still she can barely get the patient to drink more than 8 oz of fluid a day. The patient is not taking the Hint water.   Daughter is wanting the patient to be put on a regular stool softener to take a couple times a week to keep patient regulated.  Please advise

## 2020-01-08 NOTE — ED Provider Notes (Signed)
-----------------------------------------   12:23 AM on 01/08/2020 -----------------------------------------  Assuming care from Dr. Derrill Kay.  In short, Makayla [name] is a 86 y.o. female with a chief complaint of constipation.  Refer to the original H&P for additional details.  The current plan of care is to wait for urinalysis and reassess.    ----------------------------------------- 1:24 AM on 01/08/2020 -----------------------------------------  Urinalysis is reassuring with no evidence of infection.  Patient had large volume bowel movement after Dr. Derrill Kay manually disimpacted her.  I updated the patient and her daughter who was at bedside regarding the results of the urinalysis and the daughter says that they are very eager to go home at this point.  The patient is in no distress.  I encourage close outpatient follow-up.   Loleta Rose, MD 01/08/20 [phone]

## 2020-01-19 NOTE — Telephone Encounter (Signed)
Patient scheduled to come in 02/25/20

## 2020-01-22 ENCOUNTER — Other Ambulatory Visit: Payer: Self-pay

## 2020-01-22 ENCOUNTER — Other Ambulatory Visit (INDEPENDENT_AMBULATORY_CARE_PROVIDER_SITE_OTHER): Payer: Medicare PPO

## 2020-01-22 DIAGNOSIS — I10 Essential (primary) hypertension: Secondary | ICD-10-CM | POA: Diagnosis not present

## 2020-01-22 DIAGNOSIS — E538 Deficiency of other specified B group vitamins: Secondary | ICD-10-CM

## 2020-01-22 DIAGNOSIS — R7303 Prediabetes: Secondary | ICD-10-CM

## 2020-01-22 DIAGNOSIS — Z1329 Encounter for screening for other suspected endocrine disorder: Secondary | ICD-10-CM

## 2020-01-22 DIAGNOSIS — N3000 Acute cystitis without hematuria: Secondary | ICD-10-CM

## 2020-01-22 LAB — TSH: TSH: 3.75 u[IU]/mL (ref 0.35–4.50)

## 2020-01-22 LAB — LIPID PANEL
Cholesterol: 223 mg/dL — ABNORMAL HIGH (ref 0–200)
HDL: 54.1 mg/dL (ref >39.00)
LDL Cholesterol: 142 mg/dL — ABNORMAL HIGH (ref 0–99)
NonHDL: 168.92
Total CHOL/HDL Ratio: 4
Triglycerides: 136 mg/dL (ref 0.0–149.0)
VLDL: 27.2 mg/dL (ref 0.0–40.0)

## 2020-01-22 LAB — COMPREHENSIVE METABOLIC PANEL
ALT: 14 U/L (ref 0–35)
AST: 16 U/L (ref 0–37)
Albumin: 3.7 g/dL (ref 3.5–5.2)
Alkaline Phosphatase: 74 U/L (ref 39–117)
BUN: 20 mg/dL (ref 6–23)
CO2: 28 mEq/L (ref 19–32)
Calcium: 9.1 mg/dL (ref 8.4–10.5)
Chloride: 109 mEq/L (ref 96–112)
Creatinine, Ser: 1.1 mg/dL (ref 0.40–1.20)
GFR: 46.16 mL/min — ABNORMAL LOW (ref >60.00)
Glucose, Bld: 86 mg/dL (ref 70–99)
Potassium: 4.3 mEq/L (ref 3.5–5.1)
Sodium: 142 mEq/L (ref 135–145)
Total Bilirubin: 0.8 mg/dL (ref 0.2–1.2)
Total Protein: 6.8 g/dL (ref 6.0–8.3)

## 2020-01-22 LAB — HEMOGLOBIN A1C: Hgb A1c MFr Bld: 5.8 % (ref 4.6–6.5)

## 2020-01-22 LAB — CBC WITH DIFFERENTIAL/PLATELET
Basophils Absolute: 0.1 10*3/uL (ref 0.0–0.1)
Basophils Relative: 0.7 % (ref 0.0–3.0)
Eosinophils Absolute: 0.6 10*3/uL (ref 0.0–0.7)
Eosinophils Relative: 6.6 % — ABNORMAL HIGH (ref 0.0–5.0)
HCT: 43 % (ref 36.0–46.0)
Hemoglobin: 14.1 g/dL (ref 12.0–15.0)
Lymphocytes Relative: 15.8 % (ref 12.0–46.0)
Lymphs Abs: 1.5 10*3/uL (ref 0.7–4.0)
MCHC: 32.7 g/dL (ref 30.0–36.0)
MCV: 95.2 fl (ref 78.0–100.0)
Monocytes Absolute: 0.6 10*3/uL (ref 0.1–1.0)
Monocytes Relative: 6.9 % (ref 3.0–12.0)
Neutro Abs: 6.5 10*3/uL (ref 1.4–7.7)
Neutrophils Relative %: 70 % (ref 43.0–77.0)
Platelets: 193 10*3/uL (ref 150.0–400.0)
RBC: 4.52 Mil/uL (ref 3.87–5.11)
RDW: 14.1 % (ref 11.5–15.5)
WBC: 9.2 10*3/uL (ref 4.0–10.5)

## 2020-01-22 LAB — VITAMIN B12: Vitamin B-12: 584 pg/mL (ref 211–911)

## 2020-01-23 ENCOUNTER — Ambulatory Visit (INDEPENDENT_AMBULATORY_CARE_PROVIDER_SITE_OTHER): Payer: Medicare PPO

## 2020-01-23 ENCOUNTER — Ambulatory Visit: Payer: Medicare PPO | Admitting: Internal Medicine

## 2020-01-23 ENCOUNTER — Encounter: Payer: Self-pay | Admitting: Internal Medicine

## 2020-01-23 VITALS — BP 114/70 | HR 78 | Temp 97.1°F | Ht 67.0 in | Wt 161.2 lb

## 2020-01-23 DIAGNOSIS — J449 Chronic obstructive pulmonary disease, unspecified: Secondary | ICD-10-CM | POA: Diagnosis not present

## 2020-01-23 DIAGNOSIS — M545 Low back pain, unspecified: Secondary | ICD-10-CM

## 2020-01-23 DIAGNOSIS — K581 Irritable bowel syndrome with constipation: Secondary | ICD-10-CM

## 2020-01-23 DIAGNOSIS — K5909 Other constipation: Secondary | ICD-10-CM | POA: Diagnosis not present

## 2020-01-23 DIAGNOSIS — W19XXXA Unspecified fall, initial encounter: Secondary | ICD-10-CM

## 2020-01-23 DIAGNOSIS — S3992XA Unspecified injury of lower back, initial encounter: Secondary | ICD-10-CM | POA: Diagnosis not present

## 2020-01-23 DIAGNOSIS — M25551 Pain in right hip: Secondary | ICD-10-CM | POA: Diagnosis not present

## 2020-01-23 DIAGNOSIS — S79911A Unspecified injury of right hip, initial encounter: Secondary | ICD-10-CM | POA: Diagnosis not present

## 2020-01-23 LAB — URINALYSIS, ROUTINE W REFLEX MICROSCOPIC
Bilirubin Urine: NEGATIVE
Glucose, UA: NEGATIVE
Hgb urine dipstick: NEGATIVE
Ketones, ur: NEGATIVE
Leukocytes,Ua: NEGATIVE
Nitrite: NEGATIVE
Protein, ur: NEGATIVE
Specific Gravity, Urine: 1.022 (ref 1.001–1.03)
pH: <=5 (ref 5.0–8.0)

## 2020-01-23 LAB — URINE CULTURE
MICRO NUMBER:: 10266383
SPECIMEN QUALITY:: ADEQUATE

## 2020-01-23 MED ORDER — LINACLOTIDE 72 MCG PO CAPS: 72.0000 ug | ORAL_CAPSULE | Freq: Every day | ORAL | 3 refills | Status: DC

## 2020-01-23 MED ORDER — IPRATROPIUM-ALBUTEROL 0.5-2.5 (3) MG/3ML IN SOLN: 3.0000 mL | Freq: Four times a day (QID) | RESPIRATORY_TRACT | 12 refills | Status: DC | PRN

## 2020-01-23 NOTE — Progress Notes (Signed)
Chief Complaint  Patient presents with  . Fall  . Constipation   F/u with daughter [facility]  1. Recent ED visit for constipation was disimpacted but daughter gave Dulcolax, prune juice, hint water though pt does not drink enough water anyway, colace, fleet enema at home and pt never complains but was so uncomfortable she asked her daughter to take her temperature one day. Daughter was concerned and took her mom to the ED 01/07/20  CXR no significant new changes, urine culture negative from 01/07/20 with h/o recurrent UTI, labs no significant findings and she was disimpacted then had somewhat of a stool  2. Constipation pt has chronic history of constipation and has taken colace in the past see above  3. COPD she has been wheezing using trelegy inhaler but needs more of neb medication, no significant cough/sob  4. Fall 01/16/20 after ED visit unwitnessed daughter heard her fall she has converted her dining room to the pts room and has curtains for privacy which one can pull she thinks pt slipped with bedroom shoes possibly looking up to pull the curtains closed and possibly got off balance.  After fall pt was sore and c/o right hip and lower back pain she has been giving her 2 advil per day to help. Pain 2/10 today   Review of Systems  Constitutional: Positive for weight loss.       Down 6 lbs    HENT: Negative for hearing loss.   Eyes: Negative for blurred vision.  Respiratory: Positive for wheezing. Negative for cough and shortness of breath.   Cardiovascular: Negative for chest pain.  Gastrointestinal: Positive for constipation. Negative for abdominal pain.  Musculoskeletal: Positive for back pain and joint pain.  Skin: Negative for rash.  Psychiatric/Behavioral: Positive for memory loss.   Past Medical History:  Diagnosis Date  . Allergy   . Alzheimer's dementia ([place])    CT head 08/25/15 mild cortical atrophy and chronic small vessel ischemic change. atheroscloerosis. paranasal sinus  disease   . Anosmia   . Asthma   . Chicken pox   . COPD (chronic obstructive pulmonary disease) ([place])   . Emphysema of lung ([place])    per daughter never smoker exposed 2nd have via husband  . Hard of hearing    since childhood  . Hypertension   . Nasal polyps    surgery x 15 years ago and in ~2013 as of 10/08/17  . Syncope    03/2017 thought 2/2 diuretic stopped in [facility]   . UTI (urinary tract infection)    Past Surgical History:  Procedure Laterality Date  . ABDOMINAL HYSTERECTOMY     ? year ? reason ; partial   . BREAST BIOPSY     ? year   . nasal polyps removed     Family History  Problem Relation Age of Onset  . Cancer Mother        ? type  . Other Mother        scarlett fever, skin ulcer, died of complications after MVA   . Arthritis Mother   . Alcohol abuse Paternal Uncle   . Arthritis Father    Social History   Socioeconomic History  . Marital status: Widowed    Spouse name: Not on file  . Number of children: Not on file  . Years of education: Not on file  . Highest education level: Not on file  Occupational History  . Not on file  Tobacco Use  . Smoking  status: Former Games developer  . Smokeless tobacco: Never Used  Substance and Sexual Activity  . Alcohol use: No  . Drug use: No  . Sexual activity: Never  Other Topics Concern  . 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 Difficulty of Paying Living Expenses:   Food Insecurity:   . Worried About Programme researcher, broadcasting/film/video in the Last Year:   . Barista in the Last Year:   Transportation Needs:   . Freight forwarder (Medical):   Marland Kitchen Lack of Transportation (Non-Medical):   Physical Activity:   . Days of Exercise  per Week:   . Minutes of Exercise per Session:   Stress:   . Feeling of Stress :   Social Connections:   . Frequency of Communication with Friends and Family:   . Frequency of Social Gatherings with Friends and Family:   . Attends Religious Services:   . Active Member of Clubs or Organizations:   . Attends Banker Meetings:   Marland Kitchen Marital Status:   Intimate Partner Violence:   . Fear of Current or Ex-Partner:   . Emotionally Abused:   Marland Kitchen Physically Abused:   . Sexually Abused:    Current Meds  Medication Sig  . aspirin EC 81 MG tablet Take 81 mg by mouth daily.  . cetirizine (ZYRTEC) 10 MG tablet Take 1 tablet (10 mg total) by mouth daily.  . ciprofloxacin (CIPRO) 500 MG tablet Take 1 tablet (500 mg total) by mouth 2 (two) times daily. With food x 5 days  . donepezil (ARICEPT) 10 MG tablet Take 1 tablet (10 mg total) by mouth at bedtime.  . Fluticasone-Umeclidin-Vilant (TRELEGY ELLIPTA) 100-62.5-25 MCG/INH AEPB Inhale 1 puff into the lungs daily. Rinse mouth after use  . FLUZONE HIGH-DOSE 0.5 ML injection TO BE ADMINISTERED BY PHARMACIST FOR IMMUNIZATION  . Ibuprofen (ADVIL PO) Take by mouth.  . losartan (COZAAR) 25 MG tablet Take 1 tablet (25 mg total) by mouth daily.  . memantine (NAMENDA) 10 MG tablet Take 1 tablet (10 mg total) by mouth 2 (two) times daily.  . mirtazapine (REMERON) 7.5 MG tablet Take 1 tablet (7.5 mg total) by mouth at bedtime.  . mupirocin ointment (BACTROBAN) 2 % Apply 1 application topically 2 (two) times daily.  Marland Kitchen PREVNAR 13 SUSP injection TO BE ADMINISTERED BY PHARMACIST FOR IMMUNIZATION   Allergies  Allergen Reactions  . Latex     rash  . Skin Adhesives [Cyanoacrylate]    Recent Results (from the past 2160 hour(s))  Urine Culture     Status: Abnormal   Collection Time: 12/12/19 10:12 AM   Specimen: Urine  Result Value Ref Range   MICRO NUMBER: [id number]    SPECIMEN QUALITY: Adequate    Sample Source URINE    STATUS: FINAL    ISOLATE 1:  Escherichia coli (A)     Comment: Greater than 100,000 CFU/mL of Escherichia coli      Susceptibility   Escherichia coli - URINE CULTURE, REFLEX    AMOX/CLAVULANIC <=2 Sensitive     AMPICILLIN 16 Intermediate  AMPICILLIN/SULBACTAM <=2 Sensitive     CEFAZOLIN* 16 Resistant      * For uncomplicated UTI caused by E. coli,K. pneumoniae or P. mirabilis: Cefazolin issusceptible if MIC <32 mcg/mL and predictssusceptible to the oral agents cefaclor, cefdinir,cefpodoxime, cefprozil, cefuroxime, cephalexinand loracarbef.    CEFEPIME <=1 Sensitive     CEFTRIAXONE <=1 Sensitive     CIPROFLOXACIN <=0.25 Sensitive     LEVOFLOXACIN <=0.12 Sensitive     ERTAPENEM <=0.5 Sensitive     GENTAMICIN <=1 Sensitive     IMIPENEM <=0.25 Sensitive     NITROFURANTOIN 64 Intermediate     PIP/TAZO <=4 Sensitive     TOBRAMYCIN <=1 Sensitive     TRIMETH/SULFA* <=20 Sensitive      * For uncomplicated UTI caused by E. coli,K. pneumoniae or P. mirabilis: Cefazolin issusceptible if MIC <32 mcg/mL and predictssusceptible to the oral agents cefaclor, cefdinir,cefpodoxime, cefprozil, cefuroxime, cephalexinand loracarbef.Legend:S = Susceptible  I = IntermediateR = Resistant  NS = Not susceptible* = Not tested  NR = Not reported**NN = See antimicrobic comments  Urinalysis, Routine w reflex microscopic     Status: Abnormal   Collection Time: 12/12/19 10:12 AM  Result Value Ref Range   Color, Urine YELLOW YELLOW   APPearance CLEAR CLEAR   Specific Gravity, Urine 1.012 1.001 - 1.03   pH 6.0 5.0 - 8.0   Glucose, UA NEGATIVE NEGATIVE   Bilirubin Urine NEGATIVE NEGATIVE   Ketones, ur NEGATIVE NEGATIVE   Hgb urine dipstick NEGATIVE NEGATIVE   Protein, ur NEGATIVE NEGATIVE   Nitrite POSITIVE (A) NEGATIVE   Leukocytes,Ua NEGATIVE NEGATIVE   WBC, UA NONE SEEN 0 - 5 /HPF   RBC / HPF NONE SEEN 0 - 2 /HPF   Squamous Epithelial / LPF NONE SEEN < OR = 5 /HPF   Bacteria, UA MODERATE (A) NONE SEEN /HPF   Hyaline Cast NONE SEEN NONE  SEEN /LPF  CBC     Status: Abnormal   Collection Time: 01/07/20  8:31 PM  Result Value Ref Range   WBC 12.2 (H) 4.0 - 10.5 K/uL   RBC 4.73 3.87 - 5.11 MIL/uL   Hemoglobin 14.7 12.0 - 15.0 g/dL   HCT 72.5 36.6 - 44.0 %   MCV 96.0 80.0 - 100.0 fL   MCH 31.1 26.0 - 34.0 pg   MCHC 32.4 30.0 - 36.0 g/dL   RDW 34.7 42.5 - 95.6 %   Platelets 216 150 - 400 K/uL   nRBC 0.0 0.0 - 0.2 %    Comment: Performed at [facility], [address] [place], [place] [address]  Comprehensive metabolic panel     Status: Abnormal   Collection Time: 01/07/20  8:31 PM  Result Value Ref Range   Sodium 140 135 - 145 mmol/L   Potassium 5.0 3.5 - 5.1 mmol/L   Chloride 106 98 - 111 mmol/L   CO2 27 22 - 32 mmol/L   Glucose, Bld 147 (H) 70 - 99 mg/dL    Comment: Glucose reference range applies only to samples taken after fasting for at least 8 hours.   BUN 29 (H) 8 - 23 mg/dL   Creatinine, Ser 4.33 (H) 0.44 - 1.00 mg/dL   Calcium 9.1 8.9 - 29.5 mg/dL   Total Protein 7.5 6.5 - 8.1 g/dL   Albumin 4.1 3.5 - 5.0 g/dL   AST 22 15 - 41 U/L   ALT 20 0 - 44 U/L   Alkaline Phosphatase 69 38 - 126 U/L  Total Bilirubin 0.9 0.3 - 1.2 mg/dL   GFR calc non Af Amer 39 (L) >60 mL/min   GFR calc Af Amer 46 (L) >60 mL/min   Anion gap 7 5 - 15    Comment: Performed at [facility], [address] [place], [place] [address]  Urinalysis, Complete w Microscopic     Status: Abnormal   Collection Time: 01/08/20 12:40 AM  Result Value Ref Range   Color, Urine YELLOW (A) YELLOW   APPearance CLEAR (A) CLEAR   Specific Gravity, Urine 1.016 1.005 - 1.030   pH 5.0 5.0 - 8.0   Glucose, UA NEGATIVE NEGATIVE mg/dL   Hgb urine dipstick NEGATIVE NEGATIVE   Bilirubin Urine NEGATIVE NEGATIVE   Ketones, ur NEGATIVE NEGATIVE mg/dL   Protein, ur NEGATIVE NEGATIVE mg/dL   Nitrite NEGATIVE NEGATIVE   Leukocytes,Ua NEGATIVE NEGATIVE   WBC, UA 0-5 0 - 5 WBC/hpf   Bacteria, UA NONE SEEN NONE SEEN   Squamous Epithelial  / LPF 0-5 0 - 5   Mucus PRESENT     Comment: Performed at [facility], [address] [place], [place] [address]  B12     Status: None   Collection Time: 01/22/20 10:04 AM  Result Value Ref Range   Vitamin B-12 584 211 - 911 pg/mL  Urine Culture     Status: None   Collection Time: 01/22/20 10:04 AM   Specimen: Urine  Result Value Ref Range   MICRO NUMBER: [id number]    SPECIMEN QUALITY: Adequate    Sample Source URINE    STATUS: FINAL    ISOLATE 1:      Growth of mixed flora was isolated, suggesting probable contamination. No further testing will be performed. If clinically indicated, recollection using a method to minimize contamination, with prompt transfer to Urine Culture Transport Tube, is  recommended.   HgB A1c     Status: None   Collection Time: 01/22/20 10:04 AM  Result Value Ref Range   Hgb A1c MFr Bld 5.8 4.6 - 6.5 %    Comment: Glycemic Control Guidelines for People with Diabetes:Non Diabetic:  <6%Goal of Therapy: <7%Additional Action Suggested:  >8%   Urinalysis, Routine w reflex microscopic     Status: None   Collection Time: 01/22/20 10:04 AM  Result Value Ref Range   Color, Urine YELLOW YELLOW   APPearance CLEAR CLEAR   Specific Gravity, Urine 1.022 1.001 - 1.03   pH < OR = 5.0 5.0 - 8.0   Glucose, UA NEGATIVE NEGATIVE   Bilirubin Urine NEGATIVE NEGATIVE   Ketones, ur NEGATIVE NEGATIVE   Hgb urine dipstick NEGATIVE NEGATIVE   Protein, ur NEGATIVE NEGATIVE   Nitrite NEGATIVE NEGATIVE   Leukocytes,Ua NEGATIVE NEGATIVE  TSH     Status: None   Collection Time: 01/22/20 10:04 AM  Result Value Ref Range   TSH 3.75 0.35 - 4.50 uIU/mL  Lipid panel     Status: Abnormal   Collection Time: 01/22/20 10:04 AM  Result Value Ref Range   Cholesterol 223 (H) 0 - 200 mg/dL    Comment: ATP III Classification       Desirable:  < 200 mg/dL               Borderline High:  200 - 239 mg/dL          High:  > = 536 mg/dL   Triglycerides 144.3 0.0 - 149.0 mg/dL     Comment: Normal:  <154 mg/dLBorderline High:  150 - 199 mg/dL   HDL 33.29 >51.88 mg/dL   VLDL 41.6 0.0 - 60.6 mg/dL   LDL Cholesterol 301 (H) 0 - 99 mg/dL   Total CHOL/HDL Ratio 4     Comment:                Men          Women1/2 Average Risk     3.4          3.3Average Risk          5.0          4.42X Average Risk          9.6          7.13X Average Risk          15.0          11.0                       NonHDL 168.92     Comment: NOTE:  Non-HDL goal should be 30 mg/dL higher than patient's LDL goal (i.e. LDL goal of < 70 mg/dL, would have non-HDL goal of < 100 mg/dL)  CBC with Differential/Platelet     Status: Abnormal   Collection Time: 01/22/20 10:04 AM  Result Value Ref Range   WBC 9.2 4.0 - 10.5 K/uL   RBC 4.52 3.87 - 5.11 Mil/uL   Hemoglobin 14.1 12.0 - 15.0 g/dL   HCT 60.1 09.3 - 23.5 %   MCV 95.2 78.0 - 100.0 fl   MCHC 32.7 30.0 - 36.0 g/dL   RDW 57.3 22.0 - 25.4 %   Platelets 193.0 150.0 - 400.0 K/uL   Neutrophils Relative % 70.0 43.0 - 77.0 %   Lymphocytes Relative 15.8 12.0 - 46.0 %   Monocytes Relative 6.9 3.0 - 12.0 %   Eosinophils Relative 6.6 (H) 0.0 - 5.0 %   Basophils Relative 0.7 0.0 - 3.0 %   Neutro Abs 6.5 1.4 - 7.7 K/uL   Lymphs Abs 1.5 0.7 - 4.0 K/uL   Monocytes Absolute 0.6 0.1 - 1.0 K/uL   Eosinophils Absolute 0.6 0.0 - 0.7 K/uL   Basophils Absolute 0.1 0.0 - 0.1 K/uL  Comprehensive metabolic panel     Status: Abnormal   Collection Time: 01/22/20 10:04 AM  Result Value Ref Range   Sodium 142 135 - 145 mEq/L   Potassium 4.3 3.5 - 5.1 mEq/L   Chloride 109 96 - 112 mEq/L   CO2 28 19 - 32 mEq/L   Glucose, Bld 86 70 - 99 mg/dL   BUN 20 6 - 23 mg/dL   Creatinine, Ser 2.70 0.40 - 1.20 mg/dL   Total Bilirubin 0.8 0.2 - 1.2 mg/dL   Alkaline Phosphatase 74 39 - 117 U/L   AST 16 0 - 37 U/L   ALT 14 0 - 35 U/L   Total Protein 6.8 6.0 - 8.3 g/dL   Albumin 3.7 3.5 - 5.2 g/dL   GFR 62.37 (L) >62.83 mL/min   Calcium 9.1 8.4 - 10.5 mg/dL   Objective  Body mass  index is 25.25 kg/m. Wt Readings from Last 3 Encounters:  01/23/20 161 lb 3.2 oz (73.1 kg)  01/07/20 150 lb (68 kg)  10/24/19 156 lb 3.2 oz (70.9 kg)   Temp Readings from Last 3 Encounters:  01/23/20 (!) 97.1 F (36.2 C) (Temporal)  01/08/20 98.7 F (37.1 C) (Oral)  07/25/19 98.2 F (36.8 C) (Oral)   BP  Readings from Last 3 Encounters:  01/23/20 114/70  01/08/20 (!) 164/69  07/25/19 126/84   Pulse Readings from Last 3 Encounters:  01/23/20 78  01/08/20 67  07/25/19 64    Physical Exam Vitals and nursing note reviewed.  Constitutional:      Appearance: Normal appearance. She is well-developed and well-groomed.  HENT:     Head: Normocephalic and atraumatic.  Eyes:     Conjunctiva/sclera: Conjunctivae normal.     Pupils: Pupils are equal, round, and reactive to light.  Cardiovascular:     Rate and Rhythm: Normal rate and regular rhythm.     Heart sounds: Normal heart sounds. No murmur.  Pulmonary:     Effort: Pulmonary effort is normal.     Breath sounds: Normal breath sounds.  Musculoskeletal:     Lumbar back: Tenderness present.       Back:  Skin:    General: Skin is warm and dry.  Neurological:     General: No focal deficit present.     Mental Status: She is alert and oriented to person, place, and time. Mental status is at baseline.     Gait: Gait normal.  Psychiatric:        Attention and Perception: Attention and perception normal.        Mood and Affect: Mood and affect normal.        Speech: Speech normal.        Behavior: Behavior normal. Behavior is cooperative.        Thought Content: Thought content normal.        Cognition and Memory: Cognition and memory normal.        Judgment: Judgment normal.     Assessment  Plan  Chronic constipation - Plan: linaclotide (LINZESS) 72 MCG capsule Irritable bowel syndrome with constipation - Plan: linaclotide (LINZESS) 72 MCG capsule -consider 145 mg dose if the 72 dose does not help  Can continue to take  with prn colace otc  Disc with daughter [name] consider CT ab/pelvis to r/o intestinal obstruction or mass if constipation continues despite linzess but she has had chronic constipation issues long term that [name] can remember when she was younger   Fall, initial encounter - Plan: DG HIP UNILAT WITH PELVIS 2-3 VIEWS RIGHT, DG Lumbar Spine Complete  Acute right-sided low back pain, unspecified whether sciatica present - Plan: DG HIP UNILAT WITH PELVIS 2-3 VIEWS RIGHT, DG Lumbar Spine Complete  Right hip pain - Plan: DG HIP UNILAT WITH PELVIS 2-3 VIEWS RIGHT, DG Lumbar Spine Complete  -hip Xray negative and arthritis/scoliosis changes on back Xray   Chronic obstructive pulmonary disease, unspecified COPD type (HCC) - Plan: ipratropium-albuterol (DUONEB) 0.5-2.5 (3) MG/3ML SOLN  -will look into how to get flutter valve for patient to use daily  Cont trelegy   HM Had fluutd prevnar 08/11/17. Tdap utd shingrixconsider in future pna 23had 07/15/18 Possibly had zostavx 09/2014 2/2 covid vxs had   Dermatologydue for repeat f/u 01/2019 but no had due to pandemic covid 19  Eye Dr. Fannie KneeB Nice apptseen and reported constricted pupils but vision ok-previous appt   09/02/14 DEXA reviewed +osteopenia ? If ever had colonoscopy, ? Last mammogram, s/p hysterectomyOut of age window for all health maintenance   Provider: Dr. French Anaracy McLean-Scocuzza-Internal Medicine

## 2020-01-23 NOTE — Patient Instructions (Addendum)
Try 1 pill x daily x 1 week can increase to 2 pills  You can take with colace   voltaren gel otc 4x per day  Lidocaine patches or salonpas patches over the counter otc   Linaclotide oral capsules What is this medicine? LINACLOTIDE (lin a KLOE tide) is used to treat irritable bowel syndrome (IBS) with constipation as the main problem. It may also be used for relief of chronic constipation. This medicine may be used for other purposes; ask your health care provider or pharmacist if you have questions. COMMON BRAND NAME(S): Linzess What should I tell my health care provider before I take this medicine? They need to know if you have any of these conditions:  history of stool (fecal) impaction  now have diarrhea or have diarrhea often  other medical condition  stomach or intestinal disease, including bowel obstruction or abdominal adhesions  an unusual or allergic reaction to linaclotide, other medicines, foods, dyes, or preservatives  pregnant or trying to get pregnant  breast-feeding How should I use this medicine? Take this medicine by mouth with a glass of water. Follow the directions on the prescription label. Do not cut, crush or chew this medicine. Take on an empty stomach, at least 30 minutes before your first meal of the day. Take your medicine at regular intervals. Do not take your medicine more often than directed. Do not stop taking except on your doctor's advice. A special MedGuide will be given to you by the pharmacist with each prescription and refill. Be sure to read this information carefully each time. Talk to your pediatrician regarding the use of this medicine in children. This medicine is not approved for use in children. Overdosage: If you think you have taken too much of this medicine contact a poison control center or emergency room at once. NOTE: This medicine is only for you. Do not share this medicine with others. What if I miss a dose? If you miss a dose, just  skip that dose. Wait until your next dose, and take only that dose. Do not take double or extra doses. What may interact with this medicine?  certain medicines for bowel problems or bladder incontinence (these can cause constipation) This list may not describe all possible interactions. Give your health care provider a list of all the medicines, herbs, non-prescription drugs, or dietary supplements you use. Also tell them if you smoke, drink alcohol, or use illegal drugs. Some items may interact with your medicine. What should I watch for while using this medicine? Visit your doctor for regular check ups. Tell your doctor if your symptoms do not get better or if they get worse. Diarrhea is a common side effect of this medicine. It often begins within 2 weeks of starting this medicine. Stop taking this medicine and call your doctor if you get severe diarrhea. Stop taking this medicine and call your doctor or go to the nearest hospital emergency room right away if you develop unusual or severe stomach-area (abdominal) pain, especially if you also have bright red, bloody stools or black stools that look like tar. What side effects may I notice from receiving this medicine? Side effects that you should report to your doctor or health care professional as soon as possible:  allergic reactions like skin rash, itching or hives, swelling of the face, lips, or tongue  black, tarry stools  bloody or watery diarrhea  new or worsening stomach pain  severe or prolonged diarrhea Side effects that usually do not  require medical attention (report to your doctor or health care professional if they continue or are bothersome):  bloating  gas  loose stools This list may not describe all possible side effects. Call your doctor for medical advice about side effects. You may report side effects to FDA at 1-800-FDA-1088. Where should I keep my medicine? Keep out of the reach of children. Store at room  temperature between 20 and 25 degrees C (68 and 77 degrees F). Keep this medicine in the original container. Keep tightly closed in a dry place. Do not remove the desiccant packet from the bottle, it helps to protect your medicine from moisture. Throw away any unused medicine after the expiration date. NOTE: This sheet is a summary. It may not cover all possible information. If you have questions about this medicine, talk to your doctor, pharmacist, or health care provider.  2020 Elsevier/Gold Standard (2015-11-25 12:17:04)  Fall Prevention in the Home, Adult Falls can cause injuries and can affect people from all age groups. There are many simple things that you can do to make your home safe and to help prevent falls. Ask for help when making these changes, if needed. What actions can I take to prevent falls? General instructions  Use good lighting in all rooms. Replace any light bulbs that burn out.  Turn on lights if it is dark. Use night-lights.  Place frequently used items in easy-to-reach places. Lower the shelves around your home if necessary.  Set up furniture so that there are clear paths around it. Avoid moving your furniture around.  Remove throw rugs and other tripping hazards from the floor.  Avoid walking on wet floors.  Fix any uneven floor surfaces.  Add color or contrast paint or tape to grab bars and handrails in your home. Place contrasting color strips on the first and last steps of stairways.  When you use a stepladder, make sure that it is completely opened and that the sides are firmly locked. Have someone hold the ladder while you are using it. Do not climb a closed stepladder.  Be aware of any and all pets. What can I do in the bathroom?      Keep the floor dry. Immediately clean up any water that spills onto the floor.  Remove soap buildup in the tub or shower on a regular basis.  Use non-skid mats or decals on the floor of the tub or shower.  Attach  bath mats securely with double-sided, non-slip rug tape.  If you need to sit down while you are in the shower, use a plastic, non-slip stool.  Install grab bars by the toilet and in the tub and shower. Do not use towel bars as grab bars. What can I do in the bedroom?  Make sure that a bedside light is easy to reach.  Do not use oversized bedding that drapes onto the floor.  Have a firm chair that has side arms to use for getting dressed. What can I do in the kitchen?  Clean up any spills right away.  If you need to reach for something above you, use a sturdy step stool that has a grab bar.  Keep electrical cables out of the way.  Do not use floor polish or wax that makes floors slippery. If you must use wax, make sure that it is non-skid floor wax. What can I do in the stairways?  Do not leave any items on the stairs.  Make sure that you have a  light switch at the top of the stairs and the bottom of the stairs. Have them installed if you do not have them.  Make sure that there are handrails on both sides of the stairs. Fix handrails that are broken or loose. Make sure that handrails are as long as the stairways.  Install non-slip stair treads on all stairs in your home.  Avoid having throw rugs at the top or bottom of stairways, or secure the rugs with carpet tape to prevent them from moving.  Choose a carpet design that does not hide the edge of steps on the stairway.  Check any carpeting to make sure that it is firmly attached to the stairs. Fix any carpet that is loose or worn. What can I do on the outside of my home?  Use bright outdoor lighting.  Regularly repair the edges of walkways and driveways and fix any cracks.  Remove high doorway thresholds.  Trim any shrubbery on the main path into your home.  Regularly check that handrails are securely fastened and in good repair. Both sides of any steps should have handrails.  Install guardrails along the edges of any  raised decks or porches.  Clear walkways of debris and clutter, including tools and rocks.  Have leaves, snow, and ice cleared regularly.  Use sand or salt on walkways during winter months.  In the garage, clean up any spills right away, including grease or oil spills. What other actions can I take?  Wear closed-toe shoes that fit well and support your feet. Wear shoes that have rubber soles or low heels.  Use mobility aids as needed, such as canes, walkers, scooters, and crutches.  Review your medicines with your health care provider. Some medicines can cause dizziness or changes in blood pressure, which increase your risk of falling. Talk with your health care provider about other ways that you can decrease your risk of falls. This may include working with a physical therapist or trainer to improve your strength, balance, and endurance. Where to find more information  Centers for Disease Control and Prevention, STEADI: TVDivision.uy  [employer] on Aging: RingConnections.si Contact a health care provider if:  You are afraid of falling at home.  You feel weak, drowsy, or dizzy at home.  You fall at home. Summary  There are many simple things that you can do to make your home safe and to help prevent falls.  Ways to make your home safe include removing tripping hazards and installing grab bars in the bathroom.  Ask for help when making these changes in your home. This information is not intended to replace advice given to you by your health care provider. Make sure you discuss any questions you have with your health care provider. Document Revised: 10/05/2017 Document Reviewed: 06/07/2017 Elsevier Patient Education  2020 [employer].

## 2020-01-26 DIAGNOSIS — K5909 Other constipation: Secondary | ICD-10-CM | POA: Insufficient documentation

## 2020-02-11 NOTE — Addendum Note (Signed)
Addended by: Quentin Ore on: 02/11/2020 06:01 PM   Modules accepted: Orders

## 2020-02-13 ENCOUNTER — Encounter: Payer: Self-pay | Admitting: Internal Medicine

## 2020-02-16 ENCOUNTER — Other Ambulatory Visit: Payer: Self-pay | Admitting: Internal Medicine

## 2020-02-25 ENCOUNTER — Encounter: Payer: Self-pay | Admitting: Internal Medicine

## 2020-02-25 ENCOUNTER — Telehealth (INDEPENDENT_AMBULATORY_CARE_PROVIDER_SITE_OTHER): Payer: Medicare PPO | Admitting: Internal Medicine

## 2020-02-25 VITALS — Ht 67.0 in | Wt 161.2 lb

## 2020-02-25 DIAGNOSIS — M47816 Spondylosis without myelopathy or radiculopathy, lumbar region: Secondary | ICD-10-CM

## 2020-02-25 DIAGNOSIS — J439 Emphysema, unspecified: Secondary | ICD-10-CM

## 2020-02-25 DIAGNOSIS — M545 Low back pain, unspecified: Secondary | ICD-10-CM

## 2020-02-25 DIAGNOSIS — K5900 Constipation, unspecified: Secondary | ICD-10-CM | POA: Diagnosis not present

## 2020-02-25 DIAGNOSIS — F0391 Unspecified dementia with behavioral disturbance: Secondary | ICD-10-CM

## 2020-02-25 NOTE — Progress Notes (Signed)
Virtual Visit via Video Note   I connected with Makayla [name]   on 02/25/20 at 4:25 am by a video enabled telemedicine application and verified that I am speaking with the correct person using two identifiers.  Location patient: home Location provider:work or home office Persons participating in the virtual visit: patient, provider, pts daughter [name]   I discussed the limitations of evaluation and management by telemedicine and the availability of in person appointments. The patient expressed understanding and agreed to proceed.   HPI:  1. Constipation better with prunes warmed and linzess caused blow outs 2. Copd will try flutter valve to see if helps  3. Falls no further and low back pain resolved Xray with scoliosis and DDD multilevel arthritis  4. Dementia with talking in sleep at night and restless last night. She is more confused and disoriented esp yesterday on aricept 10 mg qhs and namenda 10 mg bid  Daughter does not think UTI  And wants to hold on neurology referal   ROS: See pertinent positives and negatives per HPI.  Past Medical History:  Diagnosis Date  . Allergy   . Alzheimer's dementia (HCC)    CT head 08/25/15 mild cortical atrophy and chronic small vessel ischemic change. atheroscloerosis. paranasal sinus disease   . Anosmia   . Asthma   . Chicken pox   . COPD (chronic obstructive pulmonary disease) (HCC)   . Emphysema of lung (HCC)    per daughter never smoker exposed 2nd have via husband  . Hard of hearing    since childhood  . Hypertension   . Nasal polyps    surgery x 15 years ago and in ~2013 as of 10/08/17  . Syncope    03/2017 thought 2/2 diuretic stopped in [facility]   . UTI (urinary tract infection)     Past Surgical History:  Procedure Laterality Date  . ABDOMINAL HYSTERECTOMY     ? year ? reason ; partial   . BREAST BIOPSY     ? year   . nasal polyps removed      Family History  Problem Relation Age of Onset  . Cancer Mother         ? type  . Other Mother        scarlett fever, skin ulcer, died of complications after MVA   . Arthritis Mother   . Alcohol abuse Paternal Uncle   . Arthritis Father     SOCIAL HX: lives with daughter   Current Outpatient Medications:  .  aspirin EC 81 MG tablet, Take 81 mg by mouth daily., Disp: , Rfl:  .  cetirizine (ZYRTEC) 10 MG tablet, Take 1 tablet (10 mg total) by mouth daily., Disp: 90 tablet, Rfl: 3 .  donepezil (ARICEPT) 10 MG tablet, Take 1 tablet (10 mg total) by mouth at bedtime., Disp: 90 tablet, Rfl: 3 .  Fluticasone-Umeclidin-Vilant (TRELEGY ELLIPTA) 100-62.5-25 MCG/INH AEPB, Inhale 1 puff into the lungs daily. Rinse mouth after use, Disp: 60 each, Rfl: 11 .  FLUZONE HIGH-DOSE 0.5 ML injection, TO BE ADMINISTERED BY PHARMACIST FOR IMMUNIZATION, Disp: , Rfl: 0 .  Ibuprofen (ADVIL PO), Take by mouth., Disp: , Rfl:  .  ipratropium-albuterol (DUONEB) 0.5-2.5 (3) MG/3ML SOLN, Take 3 mLs by nebulization every 6 (six) hours as needed., Disp: 360 mL, Rfl: 12 .  losartan (COZAAR) 25 MG tablet, Take 1 tablet (25 mg total) by mouth daily., Disp: 90 tablet, Rfl: 3 .  memantine (NAMENDA) 10 MG tablet, Take 1  tablet (10 mg total) by mouth 2 (two) times daily., Disp: 180 tablet, Rfl: 3 .  mirtazapine (REMERON) 7.5 MG tablet, Take 1 tablet (7.5 mg total) by mouth at bedtime., Disp: 90 tablet, Rfl: 3 .  mupirocin ointment (BACTROBAN) 2 %, Apply 1 application topically 2 (two) times daily., Disp: 30 g, Rfl: 1 .  PREVNAR 13 SUSP injection, TO BE ADMINISTERED BY PHARMACIST FOR IMMUNIZATION, Disp: , Rfl: 0  EXAM:  VITALS per patient if applicable:  GENERAL: alert, oriented, appears well and in no acute distress  HEENT: atraumatic, conjunttiva clear, no obvious abnormalities on inspection of external nose and ears  NECK: normal movements of the head and neck  LUNGS: on inspection no signs of respiratory distress, breathing rate appears normal, no obvious gross SOB, gasping or  wheezing  CV: no obvious cyanosis  MS: moves all visible extremities without noticeable abnormality  PSYCH/NEURO: pleasant and cooperative, no obvious depression or anxiety, speech and thought processing grossly intact  ASSESSMENT AND PLAN:  Discussed the following assessment and plan:  Dementia with behavioral disturbance, unspecified dementia type [facility]) Consider neurology  Consider zyprexa 2.5 mg qhs or risperdal 1 mg qhs with other meds   Constipation, unspecified constipation type Warm prune juice   Pulmonary emphysema, unspecified emphysema type (HCC) Rx flutter valve   Low back pain, unspecified back pain laterality, unspecified chronicity, unspecified whether sciatica present Resolved for now  Prn Tylenol otc topicals  Heat   HM Had fluutd prevnar 08/11/17. Tdap utd shingrixconsider in future pna 23had 07/15/18 Possibly had zostavx 09/2014 2/2 covid vxs had   Dermatologydue for repeat f/u 01/2019 butno haddue to pandemic covid 19  Eye Dr. Gertie Baron apptseen and reported constricted pupils but vision ok-previous appt  09/02/14 DEXA reviewed +osteopenia ? If ever had colonoscopy, ? Last mammogram, s/p hysterectomyOut of age windowforall health maintenance -we discussed possible serious and likely etiologies, options for evaluation and workup, limitations of telemedicine visit vs in person visit, treatment, treatment risks and precautions. Pt prefers to treat via telemedicine empirically rather then risking or undertaking an in person visit at this moment. Patient agrees to seek prompt in person care if worsening, new symptoms arise, or if is not improving with treatment.   I discussed the assessment and treatment plan with the patient. The patient was provided an opportunity to ask questions and all were answered. The patient agreed with the plan and demonstrated an understanding of the instructions.   The patient was advised to call back or seek an  in-person evaluation if the symptoms worsen or if the condition fails to improve as anticipated.  Time spent 20 minutes  Delorise Jackson, MD

## 2020-02-26 ENCOUNTER — Telehealth: Payer: Self-pay | Admitting: Internal Medicine

## 2020-02-26 NOTE — Telephone Encounter (Signed)
Lm on vm to call office to make a 4 to 45m follow up.

## 2020-02-26 NOTE — Telephone Encounter (Signed)
Unable to lm to call office to schedule a 4 to 6 month follow up.

## 2020-02-27 ENCOUNTER — Telehealth: Payer: Self-pay | Admitting: Internal Medicine

## 2020-02-27 NOTE — Telephone Encounter (Signed)
Faxed demographic to [facility] [phone]

## 2020-03-03 ENCOUNTER — Telehealth: Payer: Self-pay | Admitting: Internal Medicine

## 2020-03-03 NOTE — Telephone Encounter (Signed)
Order for DME flutter valve sent to fax for [facility] in [place].

## 2020-03-25 ENCOUNTER — Encounter: Payer: Self-pay | Admitting: Internal Medicine

## 2020-03-25 ENCOUNTER — Other Ambulatory Visit: Payer: Self-pay

## 2020-03-25 DIAGNOSIS — F0390 Unspecified dementia without behavioral disturbance: Secondary | ICD-10-CM

## 2020-03-25 MED ORDER — MEMANTINE HCL 10 MG PO TABS: 10.0000 mg | ORAL_TABLET | Freq: Two times a day (BID) | ORAL | 3 refills | Status: DC

## 2020-03-30 NOTE — Telephone Encounter (Signed)
Called Patient's daughter [name] and she states she picked the device up around 2 weeks ago. Nothing further needed at this time.

## 2020-05-05 ENCOUNTER — Other Ambulatory Visit: Payer: Self-pay | Admitting: Internal Medicine

## 2020-05-05 DIAGNOSIS — F0390 Unspecified dementia without behavioral disturbance: Secondary | ICD-10-CM

## 2020-05-05 DIAGNOSIS — F32A Depression, unspecified: Secondary | ICD-10-CM

## 2020-05-05 DIAGNOSIS — J441 Chronic obstructive pulmonary disease with (acute) exacerbation: Secondary | ICD-10-CM

## 2020-05-05 MED ORDER — MIRTAZAPINE 7.5 MG PO TABS: 7.5000 mg | ORAL_TABLET | Freq: Every day | ORAL | 3 refills | Status: DC

## 2020-05-05 MED ORDER — DONEPEZIL HCL 10 MG PO TABS: 10.0000 mg | ORAL_TABLET | Freq: Every day | ORAL | 3 refills | Status: DC

## 2020-07-15 ENCOUNTER — Other Ambulatory Visit: Payer: Self-pay

## 2020-07-15 ENCOUNTER — Ambulatory Visit: Payer: Medicare PPO | Admitting: Podiatry

## 2020-07-15 ENCOUNTER — Encounter: Payer: Self-pay | Admitting: Podiatry

## 2020-07-15 DIAGNOSIS — M21611 Bunion of right foot: Secondary | ICD-10-CM | POA: Diagnosis not present

## 2020-07-15 DIAGNOSIS — M2041 Other hammer toe(s) (acquired), right foot: Secondary | ICD-10-CM

## 2020-07-15 MED ORDER — DOXYCYCLINE HYCLATE 100 MG PO TABS: 100.0000 mg | ORAL_TABLET | Freq: Two times a day (BID) | ORAL | 0 refills | Status: DC

## 2020-07-20 ENCOUNTER — Encounter: Payer: Self-pay | Admitting: Podiatry

## 2020-07-20 NOTE — Progress Notes (Signed)
Subjective:  Patient ID: Makayla [name], female    DOB: [birth year],  MRN: [id number]  Chief Complaint  Patient presents with  . Nail Problem    Patient presents today for ingrown fungal nail right hallux    86 y.o. female presents with the above complaint.  Patient presents with complaint of right hallux mild lateral ingrown secondary to bunion deformity as well as hammertoe contracture.  Patient states is painful to touch.  Patient would like to discuss treatment options for this.  She states that this has been hurting her for a long period of time.  However this is very mild in nature.  Patient has been managing it with a toe protector/spacer.  Patient was referred to me for possible surgical intervention.   Review of Systems: Negative except as noted in the HPI. Denies N/V/F/Ch.  Past Medical History:  Diagnosis Date  . Allergy   . Alzheimer's dementia (HCC)    CT head 08/25/15 mild cortical atrophy and chronic small vessel ischemic change. atheroscloerosis. paranasal sinus disease   . Anosmia   . Asthma   . Chicken pox   . COPD (chronic obstructive pulmonary disease) (HCC)   . Emphysema of lung (HCC)    per daughter never smoker exposed 2nd have via husband  . Hard of hearing    since childhood  . Hypertension   . Nasal polyps    surgery x 15 years ago and in ~2013 as of 10/08/17  . Syncope    03/2017 thought 2/2 diuretic stopped in [facility]   . UTI (urinary tract infection)     Current Outpatient Medications:  .  aspirin EC 81 MG tablet, Take 81 mg by mouth daily., Disp: , Rfl:  .  cetirizine (ZYRTEC) 10 MG tablet, Take 1 tablet (10 mg total) by mouth daily., Disp: 90 tablet, Rfl: 3 .  donepezil (ARICEPT) 10 MG tablet, Take 1 tablet (10 mg total) by mouth at bedtime., Disp: 90 tablet, Rfl: 3 .  doxycycline (VIBRA-TABS) 100 MG tablet, Take 1 tablet (100 mg total) by mouth 2 (two) times daily., Disp: 20 tablet, Rfl: 0 .  Fluticasone-Umeclidin-Vilant (TRELEGY ELLIPTA)  100-62.5-25 MCG/INH AEPB, Inhale 1 puff into the lungs daily. Rinse mouth after use, Disp: 60 each, Rfl: 11 .  FLUZONE HIGH-DOSE 0.5 ML injection, TO BE ADMINISTERED BY PHARMACIST FOR IMMUNIZATION, Disp: , Rfl: 0 .  Ibuprofen (ADVIL PO), Take by mouth., Disp: , Rfl:  .  losartan (COZAAR) 25 MG tablet, Take 1 tablet (25 mg total) by mouth daily., Disp: 90 tablet, Rfl: 3 .  memantine (NAMENDA) 10 MG tablet, Take 1 tablet (10 mg total) by mouth 2 (two) times daily., Disp: 180 tablet, Rfl: 3 .  mirtazapine (REMERON) 7.5 MG tablet, Take 1 tablet (7.5 mg total) by mouth at bedtime., Disp: 90 tablet, Rfl: 3 .  PREVNAR 13 SUSP injection, TO BE ADMINISTERED BY PHARMACIST FOR IMMUNIZATION, Disp: , Rfl: 0  Social History   Tobacco Use  Smoking Status Former Smoker  Smokeless Tobacco Never Used    Allergies  Allergen Reactions  . Latex     rash  . Skin Adhesives [Cyanoacrylate]    Objective:  There were no vitals filed for this visit. There is no height or weight on file to calculate BMI. Constitutional Well developed. Well nourished.  Vascular Dorsalis pedis pulses palpable bilaterally. Posterior tibial pulses palpable bilaterally. Capillary refill normal to all digits.  No cyanosis or clubbing noted. Pedal hair growth normal.  Neurologic Normal  speech. Oriented to person, place, and time. Epicritic sensation to light touch grossly present bilaterally.  Dermatologic Nails well groomed and normal in appearance. No open wounds. No skin lesions.  Orthopedic:  Bunion deformity noted to the right lower extremity with lateral deviation of the hallux and hammertoe contracture semiflexible in nature of the second digit with underlying predislocation syndrome.  Mild pain on palpation to the lateral hallux nail border secondary to rubbing.  No clinical signs of infection noted   Radiographs: None Assessment:   1. Hammertoe of second toe of right foot   2. Bunion, right    Plan:  Patient was  evaluated and treated and all questions answered.  Right mild lateral hallux ingrown secondary to underlying hammertoe contracture and bunion deformity -I explained patient the etiology of painful ingrown with this relationship to the bunion and hammertoe contracture likely leading to excessive pressure.  I believe patient will benefit from continued conservative care given her age as well as her comorbidities.  Given her pain is only mild and occasional in nature I will believe patient will benefit from toe protector.  If her pain continues to get worse we can discuss removal of ingrown as well as correcting the underlying toe deformities.  Patient states understanding -Toe protectors were dispensed  No follow-ups on file.

## 2020-07-29 ENCOUNTER — Ambulatory Visit: Payer: Medicare PPO | Admitting: Podiatry

## 2020-08-03 ENCOUNTER — Ambulatory Visit: Payer: Medicare PPO | Admitting: Podiatry

## 2020-08-03 ENCOUNTER — Other Ambulatory Visit: Payer: Self-pay

## 2020-08-03 DIAGNOSIS — M2041 Other hammer toe(s) (acquired), right foot: Secondary | ICD-10-CM

## 2020-08-03 DIAGNOSIS — M21611 Bunion of right foot: Secondary | ICD-10-CM

## 2020-08-04 ENCOUNTER — Encounter: Payer: Self-pay | Admitting: Podiatry

## 2020-08-04 NOTE — Progress Notes (Signed)
Subjective:  Patient ID: Makayla [name], female    DOB: [birth year],  MRN: [id number]  No chief complaint on file.   86 y.o. female presents with the above complaint.  Patient presents with a follow-up of right hallux mild ingrown lateral border of the nail secondary to bunion deformity.  Patient states she is doing a lot better.  She does not have any further pain.  She states that the slant back procedure that I did previously helped considerably.  She denies any other acute complaints.   Review of Systems: Negative except as noted in the HPI. Denies N/V/F/Ch.  Past Medical History:  Diagnosis Date  . Allergy   . Alzheimer's dementia (HCC)    CT head 08/25/15 mild cortical atrophy and chronic small vessel ischemic change. atheroscloerosis. paranasal sinus disease   . Anosmia   . Asthma   . Chicken pox   . COPD (chronic obstructive pulmonary disease) (HCC)   . Emphysema of lung (HCC)    per daughter never smoker exposed 2nd have via husband  . Hard of hearing    since childhood  . Hypertension   . Nasal polyps    surgery x 15 years ago and in ~2013 as of 10/08/17  . Syncope    03/2017 thought 2/2 diuretic stopped in [facility]   . UTI (urinary tract infection)     Current Outpatient Medications:  .  aspirin EC 81 MG tablet, Take 81 mg by mouth daily., Disp: , Rfl:  .  cetirizine (ZYRTEC) 10 MG tablet, Take 1 tablet (10 mg total) by mouth daily., Disp: 90 tablet, Rfl: 3 .  donepezil (ARICEPT) 10 MG tablet, Take 1 tablet (10 mg total) by mouth at bedtime., Disp: 90 tablet, Rfl: 3 .  doxycycline (VIBRA-TABS) 100 MG tablet, Take 1 tablet (100 mg total) by mouth 2 (two) times daily., Disp: 20 tablet, Rfl: 0 .  Fluticasone-Umeclidin-Vilant (TRELEGY ELLIPTA) 100-62.5-25 MCG/INH AEPB, Inhale 1 puff into the lungs daily. Rinse mouth after use, Disp: 60 each, Rfl: 11 .  FLUZONE HIGH-DOSE 0.5 ML injection, TO BE ADMINISTERED BY PHARMACIST FOR IMMUNIZATION, Disp: , Rfl: 0 .  Ibuprofen (ADVIL  PO), Take by mouth., Disp: , Rfl:  .  losartan (COZAAR) 25 MG tablet, Take 1 tablet (25 mg total) by mouth daily., Disp: 90 tablet, Rfl: 3 .  memantine (NAMENDA) 10 MG tablet, Take 1 tablet (10 mg total) by mouth 2 (two) times daily., Disp: 180 tablet, Rfl: 3 .  mirtazapine (REMERON) 7.5 MG tablet, Take 1 tablet (7.5 mg total) by mouth at bedtime., Disp: 90 tablet, Rfl: 3 .  PREVNAR 13 SUSP injection, TO BE ADMINISTERED BY PHARMACIST FOR IMMUNIZATION, Disp: , Rfl: 0  Social History   Tobacco Use  Smoking Status Former Smoker  Smokeless Tobacco Never Used    Allergies  Allergen Reactions  . Latex     rash  . Skin Adhesives [Cyanoacrylate]    Objective:  There were no vitals filed for this visit. There is no height or weight on file to calculate BMI. Constitutional Well developed. Well nourished.  Vascular Dorsalis pedis pulses palpable bilaterally. Posterior tibial pulses palpable bilaterally. Capillary refill normal to all digits.  No cyanosis or clubbing noted. Pedal hair growth normal.  Neurologic Normal speech. Oriented to person, place, and time. Epicritic sensation to light touch grossly present bilaterally.  Dermatologic Nails well groomed and normal in appearance. No open wounds. No skin lesions.  Orthopedic:  Bunion deformity noted to the right lower  extremity with lateral deviation of the hallux and hammertoe contracture semiflexible in nature of the second digit with underlying predislocation syndrome.  No pain on palpation to the lateral hallux nail border secondary to rubbing.  No clinical signs of infection noted   Radiographs: None Assessment:   1. Hammertoe of second toe of right foot   2. Bunion, right    Plan:  Patient was evaluated and treated and all questions answered.  Right mild lateral hallux ingrown secondary to underlying hammertoe contracture and bunion deformity -Completely resolved with toe protectors.  The slant back procedure that I did  temporarily give her the ingrown has helped tremendously.  She no longer has any pain.  I encouraged her to come back and see me if any foot and ankle issues arises in the future. -Toe protectors were dispensed  No follow-ups on file.

## 2020-09-15 ENCOUNTER — Other Ambulatory Visit: Payer: Self-pay

## 2020-09-15 ENCOUNTER — Encounter: Payer: Self-pay | Admitting: Internal Medicine

## 2020-09-15 ENCOUNTER — Ambulatory Visit: Payer: Medicare PPO | Admitting: Internal Medicine

## 2020-09-15 VITALS — BP 118/64 | HR 71 | Temp 98.1°F | Ht 67.0 in | Wt 156.4 lb

## 2020-09-15 DIAGNOSIS — R5383 Other fatigue: Secondary | ICD-10-CM

## 2020-09-15 DIAGNOSIS — Z1283 Encounter for screening for malignant neoplasm of skin: Secondary | ICD-10-CM

## 2020-09-15 DIAGNOSIS — R0981 Nasal congestion: Secondary | ICD-10-CM

## 2020-09-15 DIAGNOSIS — H9193 Unspecified hearing loss, bilateral: Secondary | ICD-10-CM

## 2020-09-15 DIAGNOSIS — L989 Disorder of the skin and subcutaneous tissue, unspecified: Secondary | ICD-10-CM | POA: Diagnosis not present

## 2020-09-15 DIAGNOSIS — L821 Other seborrheic keratosis: Secondary | ICD-10-CM

## 2020-09-15 DIAGNOSIS — N3000 Acute cystitis without hematuria: Secondary | ICD-10-CM

## 2020-09-15 DIAGNOSIS — R296 Repeated falls: Secondary | ICD-10-CM | POA: Diagnosis not present

## 2020-09-15 DIAGNOSIS — R7303 Prediabetes: Secondary | ICD-10-CM

## 2020-09-15 DIAGNOSIS — F0391 Unspecified dementia with behavioral disturbance: Secondary | ICD-10-CM

## 2020-09-15 DIAGNOSIS — N1832 Chronic kidney disease, stage 3b: Secondary | ICD-10-CM

## 2020-09-15 DIAGNOSIS — E559 Vitamin D deficiency, unspecified: Secondary | ICD-10-CM

## 2020-09-15 DIAGNOSIS — R32 Unspecified urinary incontinence: Secondary | ICD-10-CM

## 2020-09-15 DIAGNOSIS — H547 Unspecified visual loss: Secondary | ICD-10-CM | POA: Diagnosis not present

## 2020-09-15 DIAGNOSIS — R634 Abnormal weight loss: Secondary | ICD-10-CM

## 2020-09-15 DIAGNOSIS — Z8679 Personal history of other diseases of the circulatory system: Secondary | ICD-10-CM

## 2020-09-15 DIAGNOSIS — R5381 Other malaise: Secondary | ICD-10-CM

## 2020-09-15 DIAGNOSIS — I358 Other nonrheumatic aortic valve disorders: Secondary | ICD-10-CM

## 2020-09-15 NOTE — Patient Instructions (Addendum)
nasacort or flonase   [facility] or [facility]    Confusion Confusion is the inability to think with the usual speed or clarity. People who are confused often describe their thinking as cloudy or unclear. Confusion can also include feeling disoriented. This means you are unaware of where you are or who you are. You may also not know the date or time. When confused, you may have difficulty remembering, paying attention, or making decisions. Some people also act aggressively when they are confused. In some cases, confusion may come on quickly. In other cases, it may develop slowly over time. How quickly confusion comes on depends on the cause. Confusion may be caused by:  Head injury (concussion).  Seizures.  Stroke.  Fever.  Brain tumor.  Decrease in brain function due to a vascular or neurologic condition (dementia).  Emotions, like rage or terror.  Inability to know what is real and what is not (hallucinations).  Infections, such as a urinary tract infection (UTI).  Using too much alcohol, drugs, or medicines.  Loss of fluid (dehydration) or an imbalance of salts in the body (electrolytes).  Lack of sleep.  Low blood sugar (diabetes).  Low levels of oxygen. This comes from conditions such as chronic lung disorders.  Side effects of medicines, or taking medicines that affect other medicines (drug interactions).  Lack of certain nutrients, especially niacin, thiamine, vitamin C, or vitamin B.  Sudden drop in body temperature (hypothermia).  Change in routine, such as traveling or being hospitalized. Follow these instructions at home: Pay attention to your symptoms. Tell your health care provider about any changes or if you develop new symptoms. Follow these instructions to control or treat symptoms. Ask a family member or friend for help if needed. Medicines  Take over-the-counter and prescription medicines only as told by your health care provider.  Ask your  health care provider about changing or stopping any medicines that may be causing your confusion.  Avoid pain medicines or sleep medicines until you have fully recovered.  Use a pillbox or an alarm to help you take the right medicines at the right time. Lifestyle   Eat a balanced diet that includes fruits and vegetables.  Get enough sleep. For most adults, this is 7-9 hours each night.  Do not drink alcohol.  Do not become isolated. Spend time with other people and make plans for your days.  Do not drive until your health care provider says that it is safe to do so.  Do not use any products that contain nicotine or tobacco, such as cigarettes and e-cigarettes. If you need help quitting, ask your health care provider.  Stop other activities that may increase your chances of getting hurt. These may include some work duties, sports activities, swimming, or bike riding. Ask your health care provider what activities are safe for you. What caregivers can do  Find out if the person is confused. Ask the person to state his or her name, age, and the date. If the person is unsure or answers incorrectly, he or she may be confused.  Always introduce yourself, no matter how well the person knows you.  Remind the person of his or her location. Do this often.  Place a calendar and clock near the person who is confused.  Talk about current events and plans for the day.  Keep the environment calm, quiet, and peaceful.  Help the person do the things that he or she is unable to do. These  include: ? Taking medicines. ? Keeping follow-up visits with his or her health care provider. ? Helping with household duties, including meal preparation. ? Running errands.  Get help if you need it. There are several support groups for caregivers.  If the person you are helping needs more support, consider day care, extended care programs, or a skilled nursing facility. The person's health care provider may  be able to help evaluate these options. General instructions  Monitor yourself for any conditions you may have. These may include: ? Checking your blood glucose levels, if you have diabetes. ? Watching your weight, if you are overweight. ? Monitoring your blood pressure, if you have hypertension. ? Monitoring your body temperature, if you have a fever.  Keep all follow-up visits as told by your health care provider. This is important. Contact a health care provider if:  Your symptoms get worse. Get help right away if you:  Feel that you are not able to care for yourself.  Develop severe headaches, repeated vomiting, seizures, blackouts, or slurred speech.  Have increasing confusion, weakness, numbness, restlessness, or personality changes.  Develop a loss of balance, have marked dizziness, feel uncoordinated, or fall.  Develop severe anxiety, or you have delusions or hallucinations. These symptoms may represent a serious problem that is an emergency. Do not wait to see if the symptoms will go away. Get medical help right away. Call your local emergency services (911 in the [place] Do not drive yourself to the hospital. Summary  Confusion is the inability to think with the usual speed or clarity. People who are confused often describe their thinking as cloudy or unclear.  Confusion can also include having difficulty remembering, paying attention, or making decisions.  Confusion may come on quickly or develop slowly over time, depending on the cause. There are many different causes of confusion.  Ask for help from family members or friends if you are unable to take care of yourself. This information is not intended to replace advice given to you by your health care provider. Make sure you discuss any questions you have with your health care provider. Document Revised: 10/25/2017 Document Reviewed: 10/25/2017 Elsevier Patient Education  2020 [employer]  Dementia Dementia is a  condition that affects the way the brain functions. It often affects memory and thinking. Usually, dementia gets worse with time and cannot be reversed (progressive dementia). There are many types of dementia, including:  Alzheimer's disease. This type is the most common.  Vascular dementia. This type may happen as the result of a stroke.  Lewy body dementia. This type may happen to people who have Parkinson's disease.  Frontotemporal dementia. This type is caused by damage to nerve cells (neurons) in certain parts of the brain. Some people may be affected by more than one type of dementia. This is called mixed dementia. What are the causes? Dementia is caused by damage to cells in the brain. The area of the brain and the types of cells damaged determine the type of dementia. Usually, this damage is irreversible or cannot be undone. Some examples of irreversible causes include:  Conditions that affect the blood vessels of the brain, such as diabetes, heart disease, or blood vessel disease.  Genetic mutations. In some cases, changes in the brain may be caused by another condition and can be reversed or slowed. Some examples of reversible causes include:  Injury to the brain.  Certain medicines.  Infection, such as meningitis.  Metabolic problems, such as vitamin B12  deficiency or thyroid disease.  Pressure on the brain, such as from a tumor or blood clot. What are the signs or symptoms? Symptoms of dementia depend on the type of dementia. Common signs of dementia include problems with remembering, thinking, problem solving, decision making, and communicating. These signs develop slowly or get worse with time. This may include:  Problems remembering things.  Having trouble taking a bath or putting clothes on.  Forgetting appointments.  Forgetting to pay bills.  Difficulty planning and preparing meals.  Having trouble speaking.  Getting lost easily. How is this  diagnosed? This condition is diagnosed by a specialist (neurologist). It is diagnosed based on the history of your symptoms, your medical history, a physical exam, and tests. Tests may include:  Tests to evaluate brain function, such as memory tests, cognitive tests, and other tests.  Lab tests, such as blood or urine tests.  Imaging tests, such as a CT scan, a PET scan, or an MRI.  Genetic testing. This may be done if other family members have a diagnosis of certain types of dementia. Your health care provider will talk with you and your family, friends, or caregivers about your history and symptoms. How is this treated?  Treatment for this condition depends on the cause of the dementia. Progressive dementias, such as Alzheimer's disease, cannot be cured, but there may be treatments that help to manage symptoms. Treatment might involve taking medicines that may help to:  Control the dementia.  Slow down the progression of the dementia.  Manage symptoms. In some cases, treating the cause of your dementia can improve symptoms, reverse symptoms, or slow down how quickly your dementia becomes worse. Your health care provider can direct you to support groups, organizations, and other health care providers who can help with decisions about your care. Follow these instructions at home: Medicines  Take over-the-counter and prescription medicines only as told by your health care provider.  Use a pill organizer or pill reminder to help you manage your medicines.  Avoid taking medicines that can affect thinking, such as pain medicines or sleeping medicines. Lifestyle  Make healthy lifestyle choices. ? Be physically active as told by your health care provider. ? Do not use any products that contain nicotine or tobacco, such as cigarettes, e-cigarettes, and chewing tobacco. If you need help quitting, ask your health care provider. ? Do not drink alcohol. ? Practice stress-management  techniques when you get stressed. ? Spend time with other people.  Make sure to get quality sleep. These tips can help you get a good night's rest: ? Avoid napping during the day. ? Keep your sleeping area dark and cool. ? Avoid exercising during the few hours before you go to bed. ? Avoid caffeine products in the evening. Eating and drinking  Drink enough fluid to keep your urine pale yellow.  Eat a healthy diet. General instructions   Work with your health care provider to determine what you need help with and what your safety needs are.  Talk with your health care provider about whether it is safe for you to drive.  If you were given a bracelet that identifies you as a person with memory loss or tracks your location, make sure to wear it at all times.  Work with your family to make important decisions, such as advance directives, medical power of attorney, or a living will.  Keep all follow-up visits as told by your health care provider. This is important. Where to find more  information  Alzheimer's Association: LimitLaws.huwww.alz.org  [employer] Aging: CashCowGambling.bewww.nia.nih.gov/alzheimers  World Health Organization: https://castaneda-walker.com/www.who.int Contact a health care provider if:  You have any new or worsening symptoms.  You have problems with choking or swallowing. Get help right away if:  You feel depressed or sad, or feel that you want to harm yourself.  Your family members become concerned for your safety. If you ever feel like you may hurt yourself or others, or have thoughts about taking your own life, get help right away. You can go to your nearest emergency department or call:  Your local emergency services (911 in the [place]  A suicide crisis helpline, such as the National Suicide Prevention Lifeline at 912-132-83881-339-795-6753. This is open 24 hours a day. Summary  Dementia is a condition that affects the way the brain functions. Dementia often affects memory and thinking.  Usually,  dementia gets worse with time and cannot be reversed (progressive dementia).  Treatment for this condition depends on the cause of the dementia.  Work with your health care provider to determine what you need help with and what your safety needs are.  Your health care provider can direct you to support groups, organizations, and other health care providers who can help with decisions about your care. This information is not intended to replace advice given to you by your health care provider. Make sure you discuss any questions you have with your health care provider. Document Revised: 01/07/2019 Document Reviewed: 01/07/2019 Elsevier Patient Education  2020 Elsevier Inc.  Dementia Caregiver Guide Dementia is a term used to describe a number of symptoms that affect memory and thinking. The most common symptoms include:  Memory loss.  Trouble with language and communication.  Trouble concentrating.  Poor judgment.  Problems with reasoning.  Child-like behavior and language.  Extreme anxiety.  Angry outbursts.  Wandering from home or public places. Dementia usually gets worse slowly over time. In the early stages, people with dementia can stay independent and safe with some help. In later stages, they need help with daily tasks such as dressing, grooming, and using the bathroom. How to help the person with dementia cope Dementia can be frightening and confusing. Here are some tips to help the person with dementia cope with changes caused by the disease. General tips  Keep the person on track with his or her routine.  Try to identify areas where the person may need help.  Be supportive, patient, calm, and encouraging.  Gently remind the person that adjusting to changes takes time.  Help with the tasks that the person has asked for help with.  Keep the person involved in daily tasks and decisions as much as possible.  Encourage conversation, but try not to get frustrated  or harried if the person struggles to find words or does not seem to appreciate your help. Communication tips  When the person is talking or seems frustrated, make eye contact and hold the person's hand.  Ask specific questions that need yes or no answers.  Use simple words, short sentences, and a calm voice. Only give one direction at a time.  When offering choices, limit them to just 1 or 2.  Avoid correcting the person in a negative way.  If the person is struggling to find the right words, gently try to help him or her. How to recognize symptoms of stress Symptoms of stress in caregivers include:  Feeling frustrated or angry with the person with dementia.  Denying that the person  has dementia or that his or her symptoms will not improve.  Feeling hopeless and unappreciated.  Difficulty sleeping.  Difficulty concentrating.  Feeling anxious, irritable, or depressed.  Developing stress-related health problems.  Feeling like you have too little time for your own life. Follow these instructions at home:   Make sure that you and the person you are caring for: ? Get regular sleep. ? Exercise regularly. ? Eat regular, nutritious meals. ? Drink enough fluid to keep your urine clear or pale yellow. ? Take over-the-counter and prescription medicines only as told by your health care providers. ? Attend all scheduled health care appointments.  Join a support group with others who are caregivers.  Ask about respite care resources so that you can have a regular break from the stress of caregiving.  Look for signs of stress in yourself and in the person you are caring for. If you notice signs of stress, take steps to manage it.  Consider any safety risks and take steps to avoid them.  Organize medications in a pill box for each day of the week.  Create a plan to handle any legal or financial matters. Get legal or financial advice if needed.  Keep a calendar in a central  location to remind the person of appointments or other activities. Tips for reducing the risk of injury  Keep floors clear of clutter. Remove rugs, magazine racks, and floor lamps.  Keep hallways well lit, especially at night.  Put a handrail and nonslip mat in the bathtub or shower.  Put childproof locks on cabinets that contain dangerous items, such as medicines, alcohol, guns, toxic cleaning items, sharp tools or utensils, matches, and lighters.  Put the locks in places where the person cannot see or reach them easily. This will help ensure that the person does not wander out of the house and get lost.  Be prepared for emergencies. Keep a list of emergency phone numbers and addresses in a convenient area.  Remove car keys and lock garage doors so that the person does not try to get in the car and drive.  Have the person wear a bracelet that tracks locations and identifies the person as having memory problems. This should be worn at all times for safety. Where to find support: Many individuals and organizations offer support. These include:  Support groups for people with dementia and for caregivers.  Counselors or therapists.  Home health care services.  Adult day care centers. Where to find more information Alzheimer's Association: LimitLaws.hu Contact a health care provider if:  The person's health is rapidly getting worse.  You are no longer able to care for the person.  Caring for the person is affecting your physical and emotional health.  The person threatens himself or herself, you, or anyone else. Summary  Dementia is a term used to describe a number of symptoms that affect memory and thinking.  Dementia usually gets worse slowly over time.  Take steps to reduce the person's risk of injury, and to plan for future care.  Caregivers need support, relief from caregiving, and time for their own lives. This information is not intended to replace advice given to  you by your health care provider. Make sure you discuss any questions you have with your health care provider. Document Revised: 10/05/2017 Document Reviewed: 09/26/2016 Elsevier Patient Education  2020 [employer].   Allergic Rhinitis, Adult Allergic rhinitis is an allergic reaction that affects the mucous membrane inside the nose. It causes sneezing,  a runny or stuffy nose, and the feeling of mucus going down the back of the throat (postnasal drip). Allergic rhinitis can be mild to severe. There are two types of allergic rhinitis:  Seasonal. This type is also called hay fever. It happens only during certain seasons.  Perennial. This type can happen at any time of the year. What are the causes? This condition happens when the body's defense system (immune system) responds to certain harmless substances called allergens as though they were germs.  Seasonal allergic rhinitis is triggered by pollen, which can come from grasses, trees, and weeds. Perennial allergic rhinitis may be caused by:  House dust mites.  Pet dander.  Mold spores. What are the signs or symptoms? Symptoms of this condition include:  Sneezing.  Runny or stuffy nose (nasal congestion).  Postnasal drip.  Itchy nose.  Tearing of the eyes.  Trouble sleeping.  Daytime sleepiness. How is this diagnosed? This condition may be diagnosed based on:  Your medical history.  A physical exam.  Tests to check for related conditions, such as: ? Asthma. ? Pink eye. ? Ear infection. ? Upper respiratory infection.  Tests to find out which allergens trigger your symptoms. These may include skin or blood tests. How is this treated? There is no cure for this condition, but treatment can help control symptoms. Treatment may include:  Taking medicines that block allergy symptoms, such as antihistamines. Medicine may be given as a shot, nasal spray, or pill.  Avoiding the allergen.  Desensitization. This treatment  involves getting ongoing shots until your body becomes less sensitive to the allergen. This treatment may be done if other treatments do not help.  If taking medicine and avoiding the allergen does not work, new, stronger medicines may be prescribed. Follow these instructions at home:  Find out what you are allergic to. Common allergens include smoke, dust, and pollen.  Avoid the things you are allergic to. These are some things you can do to help avoid allergens: ? Replace carpet with wood, tile, or vinyl flooring. Carpet can trap dander and dust. ? Do not smoke. Do not allow smoking in your home. ? Change your heating and air conditioning filter at least once a month. ? During allergy season:  Keep windows closed as much as possible.  Plan outdoor activities when pollen counts are lowest. This is usually during the evening hours.  When coming indoors, change clothing and shower before sitting on furniture or bedding.  Take over-the-counter and prescription medicines only as told by your health care provider.  Keep all follow-up visits as told by your health care provider. This is important. Contact a health care provider if:  You have a fever.  You develop a persistent cough.  You make whistling sounds when you breathe (you wheeze).  Your symptoms interfere with your normal daily activities. Get help right away if:  You have shortness of breath. Summary  This condition can be managed by taking medicines as directed and avoiding allergens.  Contact your health care provider if you develop a persistent cough or fever.  During allergy season, keep windows closed as much as possible. This information is not intended to replace advice given to you by your health care provider. Make sure you discuss any questions you have with your health care provider. Document Revised: 10/05/2017 Document Reviewed: 11/30/2016 Elsevier Patient Education  2020 [employer].

## 2020-09-15 NOTE — Progress Notes (Signed)
Chief Complaint  Patient presents with  . Follow-up   F/u with daughter daughter gives most of history and answers ?s 1. Pt had fall x 1 in the middle of the night 2 weeks ago and daughter found her on the floor though she did not have any injury she is waking up more at times when should be asleep and even though house has been made safer for her still falling. When daughter found her she was on hands and knees trying to get up she has a manual wheelchair but will not use much and has rollator walker encouraged pt and daughter to use this in public l   -daughter states her vision is not the best seeing Dr. Larence Penning will refer ophthalmology more comp exam and hearing reduced as well  -daughter agreeable to h/h PT/OT   2. Fatigue and weight down 5 lbs she is eating 2 meals daily and more fatigued x 2 weeks wants to be checked for UTI. She is having urinary leakage and will Rx for Depends diapers QID change size large Rx given today  3. Dementia memory waxes and wanes and more confused at times (wants her checked for UTI today) and she will close her eyes in a recliner and still be rocking or moving disc antipsychotic med today with daughter will think about this  4. Lesion to left cheek pt keeps picking at this and daughter wants removed 5. Nasal congestion when pt goes outside associated with PND and nausea and reduced appetite disc Atrovent nasal spray today but daughter states it is hard for her to get her to use Trelegy so this may be hard  6. CKD 3 will check labs today  Consider renal referral in the future and US renal    Review of Systems  Constitutional: Positive for malaise/fatigue and weight loss.  HENT: Positive for congestion and hearing loss.   Eyes:       +reduced vision  Respiratory: Negative for cough.   Cardiovascular: Negative for chest pain.  Gastrointestinal: Negative for abdominal pain.  Genitourinary:       +incontinence    Musculoskeletal: Positive for falls.  Skin:  Negative for rash.  Neurological: Negative for headaches.  Psychiatric/Behavioral: Positive for memory loss.   Past Medical History:  Diagnosis Date  . Allergy   . Alzheimer's dementia (HCC)    CT head 08/25/15 mild cortical atrophy and chronic small vessel ischemic change. atheroscloerosis. paranasal sinus disease   . Anosmia   . Asthma   . Chicken pox   . COPD (chronic obstructive pulmonary disease) (HCC)   . Emphysema of lung (HCC)    per daughter never smoker exposed 2nd have via husband  . Hard of hearing    since childhood  . Hypertension   . Nasal polyps    surgery x 15 years ago and in ~2013 as of 10/08/17  . Syncope    03/2017 thought 2/2 diuretic stopped in [facility]   . UTI (urinary tract infection)    Past Surgical History:  Procedure Laterality Date  . ABDOMINAL HYSTERECTOMY     ? year ? reason ; partial   . BREAST BIOPSY     ? year   . nasal polyps removed     Family History  Problem Relation Age of Onset  . Cancer Mother        ? type  . Other Mother        scarlett fever, skin ulcer, died of complications  after MVA   . Arthritis Mother   . Alcohol abuse Paternal Uncle   . Arthritis Father    Social History   Socioeconomic History  . Marital status: Widowed    Spouse name: Not on file  . Number of children: Not on file  . Years of education: Not on file  . Highest education level: Not on file  Occupational History  . Not on file  Tobacco Use  . Smoking status: Former Games developer  . Smokeless tobacco: Never Used  Substance and Sexual Activity  . Alcohol use: No  . Drug use: No  . Sexual activity: Never  Other Topics Concern  . 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 Difficulty of Paying Living Expenses: Not on file  Food Insecurity:   . Worried About Programme researcher, broadcasting/film/video in the Last Year: Not on file  . Ran Out of Food in the Last Year: Not on file  Transportation Needs:   . Lack of Transportation (Medical): Not on file  . Lack of Transportation (Non-Medical): Not on file  Physical Activity:   . Days of Exercise per Week: Not on file  . Minutes of Exercise per Session: Not on file  Stress:   . Feeling of Stress : Not on file  Social Connections:   . Frequency of Communication with Friends and Family: Not on file  . Frequency of Social Gatherings with Friends and Family: Not on file  . Attends Religious Services: Not on file  . Active Member of Clubs or Organizations: Not on file  . Attends Banker Meetings: Not on file  . Marital Status: Not on file  Intimate Partner Violence:   . Fear of Current or Ex-Partner: Not on file  . Emotionally Abused: Not on file  . Physically Abused: Not on file  . Sexually Abused: Not on file   Current Meds  Medication Sig  . aspirin EC 81 MG tablet Take 81 mg by mouth daily.  . cetirizine (ZYRTEC) 10 MG tablet Take 1 tablet (10 mg total) by mouth daily.  Marland Kitchen donepezil (ARICEPT) 10 MG tablet Take 1 tablet (10 mg total) by mouth at bedtime.  Marland Kitchen doxycycline (VIBRA-TABS) 100 MG tablet Take 1 tablet (100 mg total) by mouth 2 (two) times daily.  . Fluticasone-Umeclidin-Vilant (TRELEGY ELLIPTA) 100-62.5-25 MCG/INH AEPB Inhale 1 puff into the lungs daily. Rinse mouth after use  . FLUZONE HIGH-DOSE 0.5 ML injection TO BE ADMINISTERED BY PHARMACIST FOR IMMUNIZATION  . Ibuprofen (ADVIL PO) Take by mouth.  . losartan (COZAAR) 25 MG tablet Take 1 tablet (25 mg total) by mouth daily.  . memantine (NAMENDA) 10 MG tablet Take 1 tablet (10 mg total) by mouth 2 (two) times daily.  . mirtazapine (REMERON) 7.5 MG tablet Take 1 tablet (7.5 mg total) by mouth at bedtime.  Marland Kitchen PREVNAR 13 SUSP injection TO BE  ADMINISTERED BY PHARMACIST FOR IMMUNIZATION   Allergies  Allergen Reactions  . Latex     rash  . Skin Adhesives [Cyanoacrylate]    Recent Results (from the past 2160 hour(s))  Comprehensive metabolic  panel     Status: Abnormal   Collection Time: 09/15/20  3:41 PM  Result Value Ref Range   Sodium 143 135 - 145 mEq/L   Potassium 4.6 3.5 - 5.1 mEq/L   Chloride 105 96 - 112 mEq/L   CO2 30 19 - 32 mEq/L   Glucose, Bld 91 70 - 99 mg/dL   BUN 17 6 - 23 mg/dL   Creatinine, Ser 1.49 (H) 0.40 - 1.20 mg/dL   Total Bilirubin 0.4 0.2 - 1.2 mg/dL   Alkaline Phosphatase 79 39 - 117 U/L   AST 17 0 - 37 U/L   ALT 16 0 - 35 U/L   Total Protein 6.1 6.0 - 8.3 g/dL   Albumin 3.6 3.5 - 5.2 g/dL   GFR 70.26 (L) >37.85 mL/min    Comment: Calculated using the CKD-EPI Creatinine Equation (2021)   Calcium 9.1 8.4 - 10.5 mg/dL  CBC with Differential/Platelet     Status: Abnormal   Collection Time: 09/15/20  3:41 PM  Result Value Ref Range   WBC 9.2 4.0 - 10.5 K/uL   RBC 4.52 3.87 - 5.11 Mil/uL   Hemoglobin 13.8 12.0 - 15.0 g/dL   HCT 88.5 36 - 46 %   MCV 92.8 78.0 - 100.0 fl   MCHC 32.8 30.0 - 36.0 g/dL   RDW 02.7 74.1 - 28.7 %   Platelets 240.0 150 - 400 K/uL   Neutrophils Relative % 61.6 43 - 77 %   Lymphocytes Relative 19.2 12 - 46 %   Monocytes Relative 9.4 3 - 12 %   Eosinophils Relative 9.2 (H) 0 - 5 %   Basophils Relative 0.6 0 - 3 %   Neutro Abs 5.7 1.4 - 7.7 K/uL   Lymphs Abs 1.8 0.7 - 4.0 K/uL   Monocytes Absolute 0.9 0.1 - 1.0 K/uL   Eosinophils Absolute 0.8 (H) 0.0 - 0.7 K/uL   Basophils Absolute 0.1 0.0 - 0.1 K/uL  TSH     Status: None   Collection Time: 09/15/20  3:41 PM  Result Value Ref Range   TSH 3.02 0.35 - 4.50 uIU/mL  Vitamin D (25 hydroxy)     Status: Abnormal   Collection Time: 09/15/20  3:41 PM  Result Value Ref Range   VITD 28.90 (L) 30.00 - 100.00 ng/mL  Urinalysis, Routine w reflex microscopic     Status: Abnormal   Collection Time: 09/15/20  4:16 PM  Result  Value Ref Range   Color, Urine YELLOW YELLOW   APPearance CLEAR CLEAR   Specific Gravity, Urine 1.023 1.001 - 1.03   pH < OR = 5.0 5.0 - 8.0   Glucose, UA NEGATIVE NEGATIVE   Bilirubin Urine NEGATIVE NEGATIVE   Ketones, ur TRACE (A) NEGATIVE   Hgb urine dipstick NEGATIVE NEGATIVE   Protein, ur NEGATIVE NEGATIVE   Nitrite NEGATIVE NEGATIVE   Leukocytes,Ua NEGATIVE NEGATIVE  Urine Culture     Status: None   Collection Time: 09/15/20  4:16 PM   Specimen: Urine  Result Value Ref Range   MICRO NUMBER: [id number]    SPECIMEN QUALITY: Adequate    Sample Source NOT GIVEN    STATUS: FINAL    ISOLATE 1:      Growth of mixed flora was isolated, suggesting probable contamination. No further testing will be performed. If clinically indicated, recollection using a method to minimize contamination, with prompt transfer to Urine Culture Transport Tube, is  recommended.    Objective  Body mass index is  24.5 kg/m. Wt Readings from Last 3 Encounters:  09/15/20 156 lb 6.4 oz (70.9 kg)  02/25/20 161 lb 3.2 oz (73.1 kg)  01/23/20 161 lb 3.2 oz (73.1 kg)   Temp Readings from Last 3 Encounters:  09/15/20 98.1 F (36.7 C) (Oral)  01/23/20 (!) 97.1 F (36.2 C) (Temporal)  01/08/20 98.7 F (37.1 C) (Oral)   BP Readings from Last 3 Encounters:  09/15/20 118/64  01/23/20 114/70  01/08/20 (!) 164/69   Pulse Readings from Last 3 Encounters:  09/15/20 71  01/23/20 78  01/08/20 67    Physical Exam Vitals and nursing note reviewed.  Constitutional:      Appearance: Normal appearance. She is well-developed and well-groomed.  HENT:     Head: Normocephalic and atraumatic.  Eyes:     Conjunctiva/sclera: Conjunctivae normal.     Pupils: Pupils are equal, round, and reactive to light.  Cardiovascular:     Rate and Rhythm: Normal rate and regular rhythm.     Heart sounds: Murmur heard.   Pulmonary:     Effort: Pulmonary effort is normal.     Breath sounds: Normal breath sounds.  Abdominal:      General: Abdomen is flat. Bowel sounds are normal.     Tenderness: There is no abdominal tenderness.  Skin:    General: Skin is warm and dry.  Neurological:     General: No focal deficit present.     Mental Status: She is alert and oriented to person, place, and time. Mental status is at baseline.     Gait: Gait normal.  Psychiatric:        Attention and Perception: Attention and perception normal.        Mood and Affect: Mood and affect normal.        Speech: Speech normal.        Behavior: Behavior normal. Behavior is cooperative.        Thought Content: Thought content normal.        Cognition and Memory: Cognition and memory normal.        Judgment: Judgment normal.     Assessment  Plan  Recurrent falls with deconditioning- Dementia with behavioral disturbance and seems like sundowning Plan: Ambulatory referral to Home Health PT/OT Disc consider antipsychotics at night and stop remeron or taper off   Skin lesion SK left check pt picking - Plan: Ambulatory referral to Dermatology for removal and tbse  Nasal congestion Disc cont zyrtec and atrovent daughter thinks will be hard to get pt to use nose spray   Fatigue, unspecified type - Plan: Comprehensive metabolic panel, CBC with Differential/Platelet, TSH, Urinalysis, Routine w reflex microscopic,   Prediabetes rec healthy diet and exercise  Acute cystitis without hematuria - Plan: Urine Culture with incontinence depends L change 4x per day   Vitamin D deficiency - Plan: Vitamin D (25 hydroxy) rec 4000 IU qd also consider renal consult due to CKD 3b and they can manage vitamin D   Reduced vision - Plan: Ambulatory referral to Ophthalmology, Ambulatory referral to Home Health  Weight loss Disc premier protein shakes doing 2 meals per day and has to be promted to eat   Bilateral hearing loss, unspecified hearing loss type  Stage 3b chronic kidney disease (HCC) Consider renal US and renal in future   Aortic valve  sclerosis H/O valvular heart disease Echo in 2016 noted these findings + murmer on exam   HM Had fluutd prevnar 08/11/17. Tdap utd shingrixconsider in future  pna 23had 07/15/18 Possibly had zostavx 09/2014 3/3 covid vxs hadpfizer   Dermatologyreferred today   Eye Dr. Leonard SchwartzB Nice apptseen and reported constricted pupils but vision ok-previous apptreferred to ophthalmology to f/u   09/02/14 DEXA reviewed +osteopenia ? If ever had colonoscopy, ? Last mammogram, s/p hysterectomyOut of age windowforall health maintenance     Provider: Dr. French Anaracy McLean-Scocuzza-Internal Medicine

## 2020-09-16 LAB — VITAMIN D 25 HYDROXY (VIT D DEFICIENCY, FRACTURES): VITD: 28.9 ng/mL — ABNORMAL LOW (ref 30.00–100.00)

## 2020-09-16 LAB — URINALYSIS, ROUTINE W REFLEX MICROSCOPIC
Bilirubin Urine: NEGATIVE
Glucose, UA: NEGATIVE
Hgb urine dipstick: NEGATIVE
Leukocytes,Ua: NEGATIVE
Nitrite: NEGATIVE
Protein, ur: NEGATIVE
Specific Gravity, Urine: 1.023 (ref 1.001–1.03)
pH: <=5 (ref 5.0–8.0)

## 2020-09-16 LAB — COMPREHENSIVE METABOLIC PANEL
ALT: 16 U/L (ref 0–35)
AST: 17 U/L (ref 0–37)
Albumin: 3.6 g/dL (ref 3.5–5.2)
Alkaline Phosphatase: 79 U/L (ref 39–117)
BUN: 17 mg/dL (ref 6–23)
CO2: 30 mEq/L (ref 19–32)
Calcium: 9.1 mg/dL (ref 8.4–10.5)
Chloride: 105 mEq/L (ref 96–112)
Creatinine, Ser: 1.25 mg/dL — ABNORMAL HIGH (ref 0.40–1.20)
GFR: 36.62 mL/min — ABNORMAL LOW (ref >60.00)
Glucose, Bld: 91 mg/dL (ref 70–99)
Potassium: 4.6 mEq/L (ref 3.5–5.1)
Sodium: 143 mEq/L (ref 135–145)
Total Bilirubin: 0.4 mg/dL (ref 0.2–1.2)
Total Protein: 6.1 g/dL (ref 6.0–8.3)

## 2020-09-16 LAB — CBC WITH DIFFERENTIAL/PLATELET
Basophils Absolute: 0.1 10*3/uL (ref 0.0–0.1)
Basophils Relative: 0.6 % (ref 0.0–3.0)
Eosinophils Absolute: 0.8 10*3/uL — ABNORMAL HIGH (ref 0.0–0.7)
Eosinophils Relative: 9.2 % — ABNORMAL HIGH (ref 0.0–5.0)
HCT: 41.9 % (ref 36.0–46.0)
Hemoglobin: 13.8 g/dL (ref 12.0–15.0)
Lymphocytes Relative: 19.2 % (ref 12.0–46.0)
Lymphs Abs: 1.8 10*3/uL (ref 0.7–4.0)
MCHC: 32.8 g/dL (ref 30.0–36.0)
MCV: 92.8 fl (ref 78.0–100.0)
Monocytes Absolute: 0.9 10*3/uL (ref 0.1–1.0)
Monocytes Relative: 9.4 % (ref 3.0–12.0)
Neutro Abs: 5.7 10*3/uL (ref 1.4–7.7)
Neutrophils Relative %: 61.6 % (ref 43.0–77.0)
Platelets: 240 10*3/uL (ref 150.0–400.0)
RBC: 4.52 Mil/uL (ref 3.87–5.11)
RDW: 14.2 % (ref 11.5–15.5)
WBC: 9.2 10*3/uL (ref 4.0–10.5)

## 2020-09-16 LAB — TSH: TSH: 3.02 u[IU]/mL (ref 0.35–4.50)

## 2020-09-16 LAB — URINE CULTURE
MICRO NUMBER:: 11185903
SPECIMEN QUALITY:: ADEQUATE

## 2020-09-21 DIAGNOSIS — R296 Repeated falls: Secondary | ICD-10-CM | POA: Insufficient documentation

## 2020-09-21 DIAGNOSIS — N1832 Chronic kidney disease, stage 3b: Secondary | ICD-10-CM | POA: Insufficient documentation

## 2020-09-21 DIAGNOSIS — Z8679 Personal history of other diseases of the circulatory system: Secondary | ICD-10-CM | POA: Insufficient documentation

## 2020-09-21 DIAGNOSIS — H547 Unspecified visual loss: Secondary | ICD-10-CM | POA: Insufficient documentation

## 2020-09-21 DIAGNOSIS — H9193 Unspecified hearing loss, bilateral: Secondary | ICD-10-CM | POA: Insufficient documentation

## 2020-09-21 DIAGNOSIS — E559 Vitamin D deficiency, unspecified: Secondary | ICD-10-CM | POA: Insufficient documentation

## 2020-09-21 DIAGNOSIS — R5383 Other fatigue: Secondary | ICD-10-CM | POA: Insufficient documentation

## 2020-09-21 DIAGNOSIS — R32 Unspecified urinary incontinence: Secondary | ICD-10-CM | POA: Insufficient documentation

## 2020-09-21 DIAGNOSIS — R7303 Prediabetes: Secondary | ICD-10-CM | POA: Insufficient documentation

## 2020-09-24 DIAGNOSIS — R430 Anosmia: Secondary | ICD-10-CM | POA: Diagnosis not present

## 2020-09-24 DIAGNOSIS — I358 Other nonrheumatic aortic valve disorders: Secondary | ICD-10-CM | POA: Diagnosis not present

## 2020-09-24 DIAGNOSIS — F0281 Dementia in other diseases classified elsewhere with behavioral disturbance: Secondary | ICD-10-CM | POA: Diagnosis not present

## 2020-09-24 DIAGNOSIS — E559 Vitamin D deficiency, unspecified: Secondary | ICD-10-CM | POA: Diagnosis not present

## 2020-09-24 DIAGNOSIS — G309 Alzheimer's disease, unspecified: Secondary | ICD-10-CM | POA: Diagnosis not present

## 2020-09-24 DIAGNOSIS — J439 Emphysema, unspecified: Secondary | ICD-10-CM | POA: Diagnosis not present

## 2020-09-24 DIAGNOSIS — N1832 Chronic kidney disease, stage 3b: Secondary | ICD-10-CM | POA: Diagnosis not present

## 2020-09-24 DIAGNOSIS — I129 Hypertensive chronic kidney disease with stage 1 through stage 4 chronic kidney disease, or unspecified chronic kidney disease: Secondary | ICD-10-CM | POA: Diagnosis not present

## 2020-09-24 DIAGNOSIS — H9193 Unspecified hearing loss, bilateral: Secondary | ICD-10-CM | POA: Diagnosis not present

## 2020-09-27 DIAGNOSIS — L820 Inflamed seborrheic keratosis: Secondary | ICD-10-CM | POA: Diagnosis not present

## 2020-09-27 DIAGNOSIS — L538 Other specified erythematous conditions: Secondary | ICD-10-CM | POA: Diagnosis not present

## 2020-09-29 ENCOUNTER — Telehealth: Payer: Self-pay | Admitting: Internal Medicine

## 2020-09-29 NOTE — Telephone Encounter (Signed)
Faxed to [facility] for physician order for 1w1, 2w5, 1w2 faxed on 09-29-2020

## 2020-09-29 NOTE — Telephone Encounter (Signed)
Faxed to [facility] for physician order for 1w1, 2w2, 1w5 faxed on 09-29-2020

## 2020-10-01 DIAGNOSIS — I358 Other nonrheumatic aortic valve disorders: Secondary | ICD-10-CM | POA: Diagnosis not present

## 2020-10-01 DIAGNOSIS — E559 Vitamin D deficiency, unspecified: Secondary | ICD-10-CM | POA: Diagnosis not present

## 2020-10-01 DIAGNOSIS — H9193 Unspecified hearing loss, bilateral: Secondary | ICD-10-CM | POA: Diagnosis not present

## 2020-10-01 DIAGNOSIS — R430 Anosmia: Secondary | ICD-10-CM | POA: Diagnosis not present

## 2020-10-01 DIAGNOSIS — J439 Emphysema, unspecified: Secondary | ICD-10-CM | POA: Diagnosis not present

## 2020-10-01 DIAGNOSIS — G309 Alzheimer's disease, unspecified: Secondary | ICD-10-CM | POA: Diagnosis not present

## 2020-10-01 DIAGNOSIS — I129 Hypertensive chronic kidney disease with stage 1 through stage 4 chronic kidney disease, or unspecified chronic kidney disease: Secondary | ICD-10-CM | POA: Diagnosis not present

## 2020-10-01 DIAGNOSIS — F0281 Dementia in other diseases classified elsewhere with behavioral disturbance: Secondary | ICD-10-CM | POA: Diagnosis not present

## 2020-10-01 DIAGNOSIS — N1832 Chronic kidney disease, stage 3b: Secondary | ICD-10-CM | POA: Diagnosis not present

## 2020-10-06 DIAGNOSIS — F0281 Dementia in other diseases classified elsewhere with behavioral disturbance: Secondary | ICD-10-CM | POA: Diagnosis not present

## 2020-10-06 DIAGNOSIS — R430 Anosmia: Secondary | ICD-10-CM | POA: Diagnosis not present

## 2020-10-06 DIAGNOSIS — H9193 Unspecified hearing loss, bilateral: Secondary | ICD-10-CM | POA: Diagnosis not present

## 2020-10-06 DIAGNOSIS — N1832 Chronic kidney disease, stage 3b: Secondary | ICD-10-CM | POA: Diagnosis not present

## 2020-10-06 DIAGNOSIS — I129 Hypertensive chronic kidney disease with stage 1 through stage 4 chronic kidney disease, or unspecified chronic kidney disease: Secondary | ICD-10-CM | POA: Diagnosis not present

## 2020-10-06 DIAGNOSIS — I358 Other nonrheumatic aortic valve disorders: Secondary | ICD-10-CM | POA: Diagnosis not present

## 2020-10-06 DIAGNOSIS — J439 Emphysema, unspecified: Secondary | ICD-10-CM | POA: Diagnosis not present

## 2020-10-06 DIAGNOSIS — E559 Vitamin D deficiency, unspecified: Secondary | ICD-10-CM | POA: Diagnosis not present

## 2020-10-06 DIAGNOSIS — G309 Alzheimer's disease, unspecified: Secondary | ICD-10-CM | POA: Diagnosis not present

## 2020-10-08 DIAGNOSIS — J439 Emphysema, unspecified: Secondary | ICD-10-CM | POA: Diagnosis not present

## 2020-10-08 DIAGNOSIS — N1832 Chronic kidney disease, stage 3b: Secondary | ICD-10-CM | POA: Diagnosis not present

## 2020-10-08 DIAGNOSIS — H9193 Unspecified hearing loss, bilateral: Secondary | ICD-10-CM | POA: Diagnosis not present

## 2020-10-08 DIAGNOSIS — F0281 Dementia in other diseases classified elsewhere with behavioral disturbance: Secondary | ICD-10-CM | POA: Diagnosis not present

## 2020-10-08 DIAGNOSIS — G309 Alzheimer's disease, unspecified: Secondary | ICD-10-CM | POA: Diagnosis not present

## 2020-10-08 DIAGNOSIS — E559 Vitamin D deficiency, unspecified: Secondary | ICD-10-CM | POA: Diagnosis not present

## 2020-10-08 DIAGNOSIS — I129 Hypertensive chronic kidney disease with stage 1 through stage 4 chronic kidney disease, or unspecified chronic kidney disease: Secondary | ICD-10-CM | POA: Diagnosis not present

## 2020-10-08 DIAGNOSIS — R430 Anosmia: Secondary | ICD-10-CM | POA: Diagnosis not present

## 2020-10-08 DIAGNOSIS — I358 Other nonrheumatic aortic valve disorders: Secondary | ICD-10-CM | POA: Diagnosis not present

## 2020-10-13 ENCOUNTER — Telehealth: Payer: Self-pay | Admitting: Internal Medicine

## 2020-10-13 DIAGNOSIS — I358 Other nonrheumatic aortic valve disorders: Secondary | ICD-10-CM | POA: Diagnosis not present

## 2020-10-13 DIAGNOSIS — E559 Vitamin D deficiency, unspecified: Secondary | ICD-10-CM | POA: Diagnosis not present

## 2020-10-13 DIAGNOSIS — J439 Emphysema, unspecified: Secondary | ICD-10-CM | POA: Diagnosis not present

## 2020-10-13 DIAGNOSIS — F0281 Dementia in other diseases classified elsewhere with behavioral disturbance: Secondary | ICD-10-CM | POA: Diagnosis not present

## 2020-10-13 DIAGNOSIS — R430 Anosmia: Secondary | ICD-10-CM | POA: Diagnosis not present

## 2020-10-13 DIAGNOSIS — G309 Alzheimer's disease, unspecified: Secondary | ICD-10-CM | POA: Diagnosis not present

## 2020-10-13 DIAGNOSIS — H9193 Unspecified hearing loss, bilateral: Secondary | ICD-10-CM | POA: Diagnosis not present

## 2020-10-13 DIAGNOSIS — N1832 Chronic kidney disease, stage 3b: Secondary | ICD-10-CM | POA: Diagnosis not present

## 2020-10-13 DIAGNOSIS — I129 Hypertensive chronic kidney disease with stage 1 through stage 4 chronic kidney disease, or unspecified chronic kidney disease: Secondary | ICD-10-CM | POA: Diagnosis not present

## 2020-10-13 NOTE — Telephone Encounter (Signed)
Faxed to [facility] for home health certification and plan of care for 09-24-2020 to 11-22-2020 faxed on 10-13-2020

## 2020-10-15 DIAGNOSIS — F0281 Dementia in other diseases classified elsewhere with behavioral disturbance: Secondary | ICD-10-CM | POA: Diagnosis not present

## 2020-10-15 DIAGNOSIS — E559 Vitamin D deficiency, unspecified: Secondary | ICD-10-CM | POA: Diagnosis not present

## 2020-10-15 DIAGNOSIS — I129 Hypertensive chronic kidney disease with stage 1 through stage 4 chronic kidney disease, or unspecified chronic kidney disease: Secondary | ICD-10-CM | POA: Diagnosis not present

## 2020-10-15 DIAGNOSIS — H9193 Unspecified hearing loss, bilateral: Secondary | ICD-10-CM | POA: Diagnosis not present

## 2020-10-15 DIAGNOSIS — G309 Alzheimer's disease, unspecified: Secondary | ICD-10-CM | POA: Diagnosis not present

## 2020-10-15 DIAGNOSIS — I358 Other nonrheumatic aortic valve disorders: Secondary | ICD-10-CM | POA: Diagnosis not present

## 2020-10-15 DIAGNOSIS — J439 Emphysema, unspecified: Secondary | ICD-10-CM | POA: Diagnosis not present

## 2020-10-15 DIAGNOSIS — N1832 Chronic kidney disease, stage 3b: Secondary | ICD-10-CM | POA: Diagnosis not present

## 2020-10-15 DIAGNOSIS — R430 Anosmia: Secondary | ICD-10-CM | POA: Diagnosis not present

## 2020-10-18 ENCOUNTER — Other Ambulatory Visit: Payer: Self-pay

## 2020-10-18 DIAGNOSIS — J441 Chronic obstructive pulmonary disease with (acute) exacerbation: Secondary | ICD-10-CM

## 2020-10-18 MED ORDER — TRELEGY ELLIPTA 100-62.5-25 MCG/INH IN AEPB: 1.0000 | INHALATION_SPRAY | Freq: Every day | RESPIRATORY_TRACT | 11 refills | Status: DC

## 2020-10-20 ENCOUNTER — Other Ambulatory Visit: Payer: Self-pay

## 2020-10-20 DIAGNOSIS — N1832 Chronic kidney disease, stage 3b: Secondary | ICD-10-CM | POA: Diagnosis not present

## 2020-10-20 DIAGNOSIS — H9193 Unspecified hearing loss, bilateral: Secondary | ICD-10-CM | POA: Diagnosis not present

## 2020-10-20 DIAGNOSIS — R430 Anosmia: Secondary | ICD-10-CM | POA: Diagnosis not present

## 2020-10-20 DIAGNOSIS — I10 Essential (primary) hypertension: Secondary | ICD-10-CM

## 2020-10-20 DIAGNOSIS — I129 Hypertensive chronic kidney disease with stage 1 through stage 4 chronic kidney disease, or unspecified chronic kidney disease: Secondary | ICD-10-CM | POA: Diagnosis not present

## 2020-10-20 DIAGNOSIS — J439 Emphysema, unspecified: Secondary | ICD-10-CM | POA: Diagnosis not present

## 2020-10-20 DIAGNOSIS — G309 Alzheimer's disease, unspecified: Secondary | ICD-10-CM | POA: Diagnosis not present

## 2020-10-20 DIAGNOSIS — E559 Vitamin D deficiency, unspecified: Secondary | ICD-10-CM | POA: Diagnosis not present

## 2020-10-20 DIAGNOSIS — F0281 Dementia in other diseases classified elsewhere with behavioral disturbance: Secondary | ICD-10-CM | POA: Diagnosis not present

## 2020-10-20 DIAGNOSIS — I358 Other nonrheumatic aortic valve disorders: Secondary | ICD-10-CM | POA: Diagnosis not present

## 2020-10-20 MED ORDER — LOSARTAN POTASSIUM 25 MG PO TABS: 25.0000 mg | ORAL_TABLET | Freq: Every day | ORAL | 3 refills | Status: DC

## 2020-10-21 DIAGNOSIS — I129 Hypertensive chronic kidney disease with stage 1 through stage 4 chronic kidney disease, or unspecified chronic kidney disease: Secondary | ICD-10-CM | POA: Diagnosis not present

## 2020-10-21 DIAGNOSIS — R430 Anosmia: Secondary | ICD-10-CM | POA: Diagnosis not present

## 2020-10-21 DIAGNOSIS — E559 Vitamin D deficiency, unspecified: Secondary | ICD-10-CM | POA: Diagnosis not present

## 2020-10-21 DIAGNOSIS — F0281 Dementia in other diseases classified elsewhere with behavioral disturbance: Secondary | ICD-10-CM | POA: Diagnosis not present

## 2020-10-21 DIAGNOSIS — G309 Alzheimer's disease, unspecified: Secondary | ICD-10-CM | POA: Diagnosis not present

## 2020-10-21 DIAGNOSIS — H9193 Unspecified hearing loss, bilateral: Secondary | ICD-10-CM | POA: Diagnosis not present

## 2020-10-21 DIAGNOSIS — N1832 Chronic kidney disease, stage 3b: Secondary | ICD-10-CM | POA: Diagnosis not present

## 2020-10-21 DIAGNOSIS — J439 Emphysema, unspecified: Secondary | ICD-10-CM | POA: Diagnosis not present

## 2020-10-21 DIAGNOSIS — I358 Other nonrheumatic aortic valve disorders: Secondary | ICD-10-CM | POA: Diagnosis not present

## 2020-10-24 DIAGNOSIS — I358 Other nonrheumatic aortic valve disorders: Secondary | ICD-10-CM | POA: Diagnosis not present

## 2020-10-24 DIAGNOSIS — E559 Vitamin D deficiency, unspecified: Secondary | ICD-10-CM | POA: Diagnosis not present

## 2020-10-24 DIAGNOSIS — N1832 Chronic kidney disease, stage 3b: Secondary | ICD-10-CM | POA: Diagnosis not present

## 2020-10-24 DIAGNOSIS — G309 Alzheimer's disease, unspecified: Secondary | ICD-10-CM | POA: Diagnosis not present

## 2020-10-24 DIAGNOSIS — R430 Anosmia: Secondary | ICD-10-CM | POA: Diagnosis not present

## 2020-10-24 DIAGNOSIS — J439 Emphysema, unspecified: Secondary | ICD-10-CM | POA: Diagnosis not present

## 2020-10-24 DIAGNOSIS — H9193 Unspecified hearing loss, bilateral: Secondary | ICD-10-CM | POA: Diagnosis not present

## 2020-10-24 DIAGNOSIS — I129 Hypertensive chronic kidney disease with stage 1 through stage 4 chronic kidney disease, or unspecified chronic kidney disease: Secondary | ICD-10-CM | POA: Diagnosis not present

## 2020-10-24 DIAGNOSIS — F0281 Dementia in other diseases classified elsewhere with behavioral disturbance: Secondary | ICD-10-CM | POA: Diagnosis not present

## 2020-10-26 NOTE — Telephone Encounter (Signed)
Faxed signed Add on discipline for OT eval to [phone]

## 2020-10-28 DIAGNOSIS — J439 Emphysema, unspecified: Secondary | ICD-10-CM | POA: Diagnosis not present

## 2020-10-28 DIAGNOSIS — I129 Hypertensive chronic kidney disease with stage 1 through stage 4 chronic kidney disease, or unspecified chronic kidney disease: Secondary | ICD-10-CM | POA: Diagnosis not present

## 2020-10-28 DIAGNOSIS — R430 Anosmia: Secondary | ICD-10-CM | POA: Diagnosis not present

## 2020-10-28 DIAGNOSIS — N1832 Chronic kidney disease, stage 3b: Secondary | ICD-10-CM | POA: Diagnosis not present

## 2020-10-28 DIAGNOSIS — G309 Alzheimer's disease, unspecified: Secondary | ICD-10-CM | POA: Diagnosis not present

## 2020-10-28 DIAGNOSIS — I358 Other nonrheumatic aortic valve disorders: Secondary | ICD-10-CM | POA: Diagnosis not present

## 2020-10-28 DIAGNOSIS — H9193 Unspecified hearing loss, bilateral: Secondary | ICD-10-CM | POA: Diagnosis not present

## 2020-10-28 DIAGNOSIS — F0281 Dementia in other diseases classified elsewhere with behavioral disturbance: Secondary | ICD-10-CM | POA: Diagnosis not present

## 2020-10-28 DIAGNOSIS — E559 Vitamin D deficiency, unspecified: Secondary | ICD-10-CM | POA: Diagnosis not present

## 2020-11-02 IMAGING — DX DG LUMBAR SPINE COMPLETE 4+V
5 series · 5 of 5 positions shown · non-contrast
Comparison: None.

CLINICAL DATA: Pain after fall January 16, 2020

EXAM:
LUMBAR SPINE - COMPLETE 4+ VIEW

[lumbar spine ap]
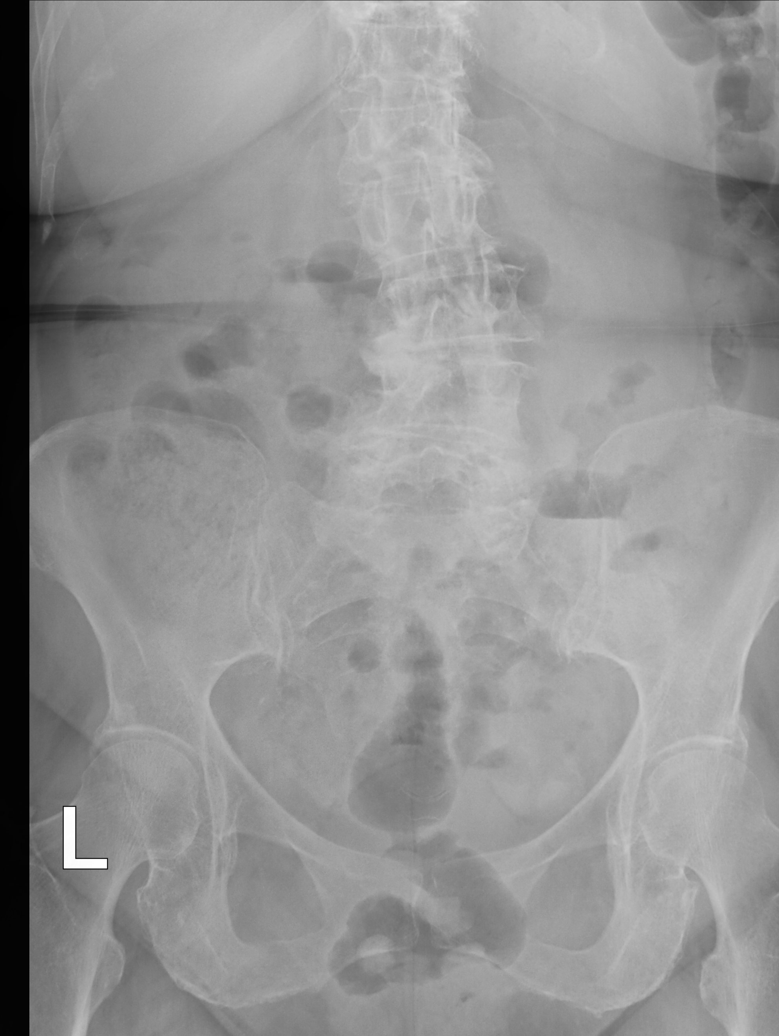

[lumbar spine obl (oblique) (1 of 2)]
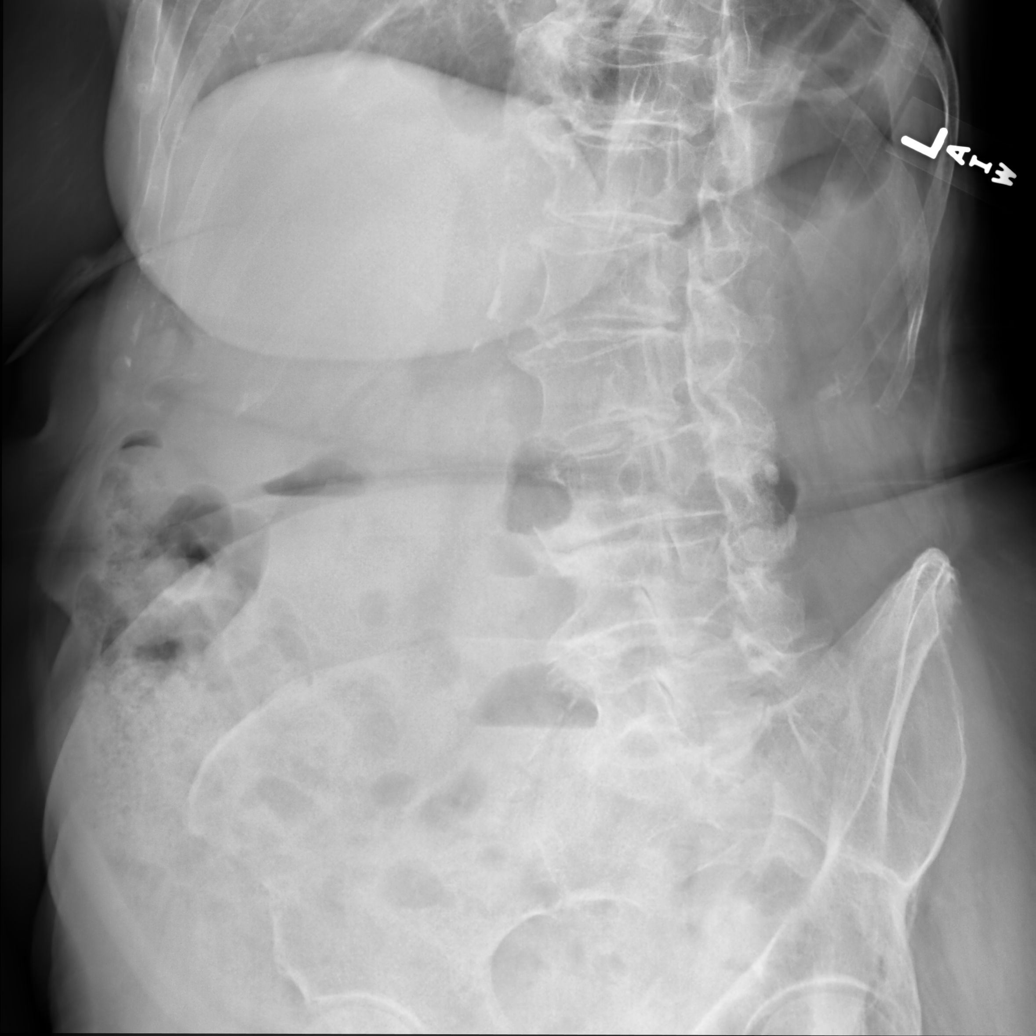

[lumbar spine obl (oblique) (2 of 2)]
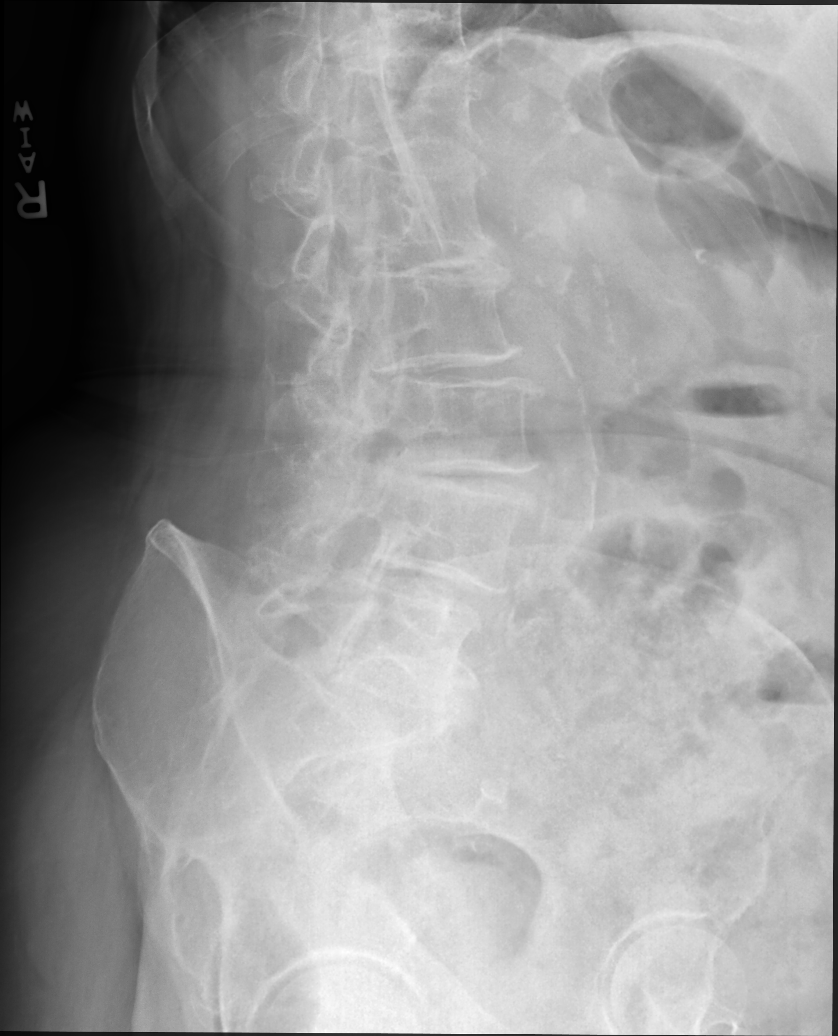

[lumbar spine lat]
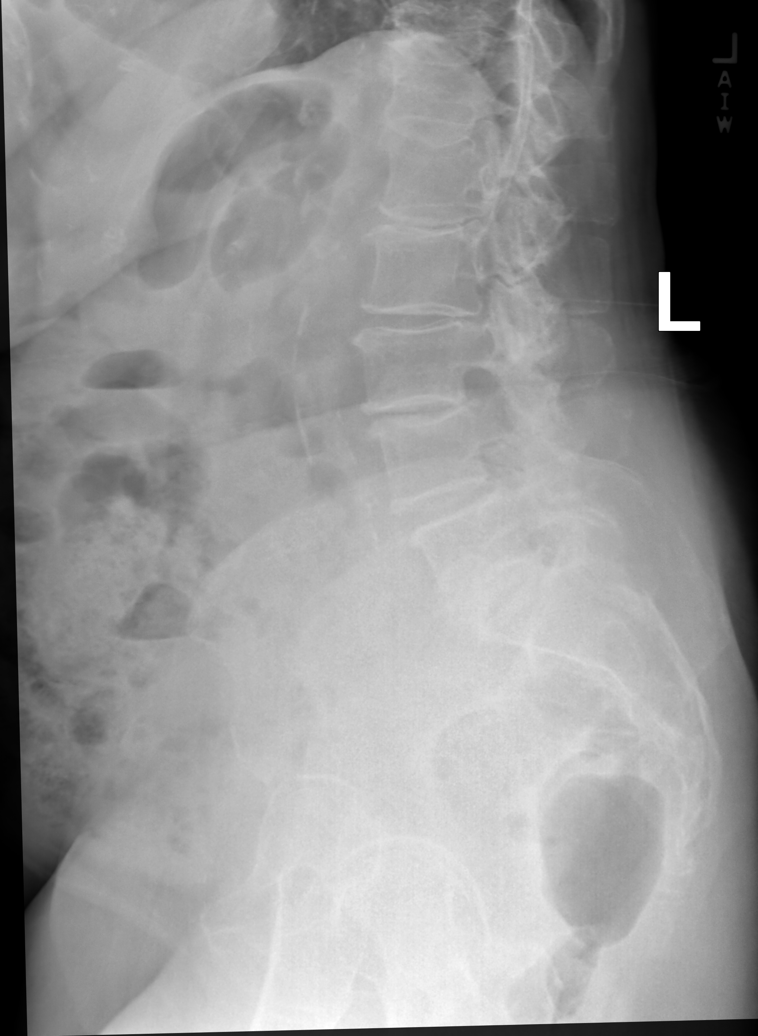

[lumbar spot lat]
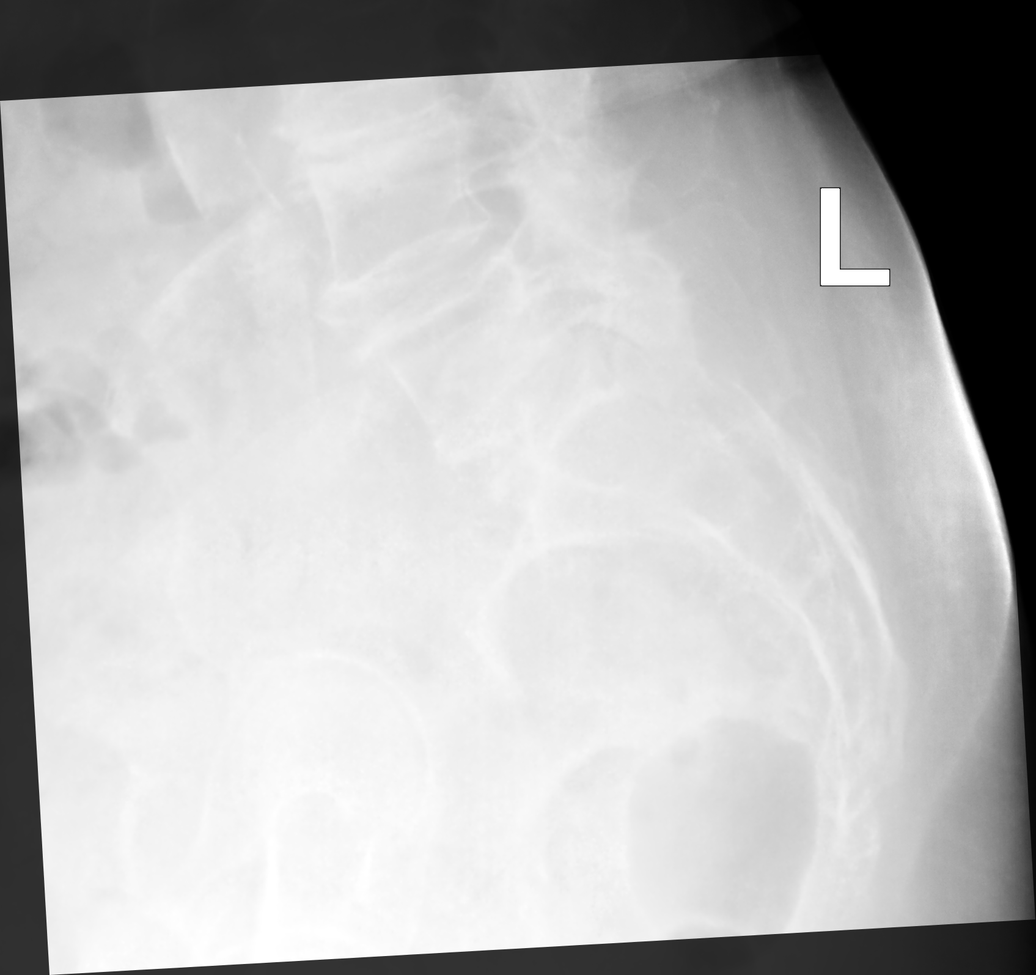

[5 of 5 positions shown; findings below may reference images not displayed]

FINDINGS: Scoliotic curvature of the lumbar spine, apex to the left. Minimal
grade 1 anterolisthesis of L4 versus L5. Minimal retrolisthesis of
L2 versus L3, grade 1. Facet degenerative changes. Multilevel
degenerative disc disease. No fractures are noted.
IMPRESSION: No traumatic malalignment or fracture.  Degenerative changes.

Calcified atherosclerosis of the abdominal aorta.

## 2020-11-03 DIAGNOSIS — R430 Anosmia: Secondary | ICD-10-CM | POA: Diagnosis not present

## 2020-11-03 DIAGNOSIS — G309 Alzheimer's disease, unspecified: Secondary | ICD-10-CM | POA: Diagnosis not present

## 2020-11-03 DIAGNOSIS — J439 Emphysema, unspecified: Secondary | ICD-10-CM | POA: Diagnosis not present

## 2020-11-03 DIAGNOSIS — F0281 Dementia in other diseases classified elsewhere with behavioral disturbance: Secondary | ICD-10-CM | POA: Diagnosis not present

## 2020-11-03 DIAGNOSIS — N1832 Chronic kidney disease, stage 3b: Secondary | ICD-10-CM | POA: Diagnosis not present

## 2020-11-03 DIAGNOSIS — I358 Other nonrheumatic aortic valve disorders: Secondary | ICD-10-CM | POA: Diagnosis not present

## 2020-11-03 DIAGNOSIS — E559 Vitamin D deficiency, unspecified: Secondary | ICD-10-CM | POA: Diagnosis not present

## 2020-11-03 DIAGNOSIS — I129 Hypertensive chronic kidney disease with stage 1 through stage 4 chronic kidney disease, or unspecified chronic kidney disease: Secondary | ICD-10-CM | POA: Diagnosis not present

## 2020-11-03 DIAGNOSIS — H9193 Unspecified hearing loss, bilateral: Secondary | ICD-10-CM | POA: Diagnosis not present

## 2020-11-12 DIAGNOSIS — R430 Anosmia: Secondary | ICD-10-CM | POA: Diagnosis not present

## 2020-11-12 DIAGNOSIS — I358 Other nonrheumatic aortic valve disorders: Secondary | ICD-10-CM | POA: Diagnosis not present

## 2020-11-12 DIAGNOSIS — J439 Emphysema, unspecified: Secondary | ICD-10-CM | POA: Diagnosis not present

## 2020-11-12 DIAGNOSIS — I129 Hypertensive chronic kidney disease with stage 1 through stage 4 chronic kidney disease, or unspecified chronic kidney disease: Secondary | ICD-10-CM | POA: Diagnosis not present

## 2020-11-12 DIAGNOSIS — F0281 Dementia in other diseases classified elsewhere with behavioral disturbance: Secondary | ICD-10-CM | POA: Diagnosis not present

## 2020-11-12 DIAGNOSIS — E559 Vitamin D deficiency, unspecified: Secondary | ICD-10-CM | POA: Diagnosis not present

## 2020-11-12 DIAGNOSIS — H9193 Unspecified hearing loss, bilateral: Secondary | ICD-10-CM | POA: Diagnosis not present

## 2020-11-12 DIAGNOSIS — G309 Alzheimer's disease, unspecified: Secondary | ICD-10-CM | POA: Diagnosis not present

## 2020-11-12 DIAGNOSIS — N1832 Chronic kidney disease, stage 3b: Secondary | ICD-10-CM | POA: Diagnosis not present

## 2020-11-21 ENCOUNTER — Encounter: Payer: Self-pay | Admitting: Internal Medicine

## 2020-11-22 DIAGNOSIS — J439 Emphysema, unspecified: Secondary | ICD-10-CM | POA: Diagnosis not present

## 2020-11-22 DIAGNOSIS — G309 Alzheimer's disease, unspecified: Secondary | ICD-10-CM | POA: Diagnosis not present

## 2020-11-22 DIAGNOSIS — R430 Anosmia: Secondary | ICD-10-CM | POA: Diagnosis not present

## 2020-11-22 DIAGNOSIS — F0281 Dementia in other diseases classified elsewhere with behavioral disturbance: Secondary | ICD-10-CM | POA: Diagnosis not present

## 2020-11-22 DIAGNOSIS — E559 Vitamin D deficiency, unspecified: Secondary | ICD-10-CM | POA: Diagnosis not present

## 2020-11-22 DIAGNOSIS — H9193 Unspecified hearing loss, bilateral: Secondary | ICD-10-CM | POA: Diagnosis not present

## 2020-11-22 DIAGNOSIS — I129 Hypertensive chronic kidney disease with stage 1 through stage 4 chronic kidney disease, or unspecified chronic kidney disease: Secondary | ICD-10-CM | POA: Diagnosis not present

## 2020-11-22 DIAGNOSIS — I358 Other nonrheumatic aortic valve disorders: Secondary | ICD-10-CM | POA: Diagnosis not present

## 2020-11-22 DIAGNOSIS — N1832 Chronic kidney disease, stage 3b: Secondary | ICD-10-CM | POA: Diagnosis not present

## 2020-12-02 ENCOUNTER — Encounter: Payer: Self-pay | Admitting: Internal Medicine

## 2020-12-02 DIAGNOSIS — J4520 Mild intermittent asthma, uncomplicated: Secondary | ICD-10-CM

## 2020-12-03 MED ORDER — CETIRIZINE HCL 10 MG PO TABS: 10.0000 mg | ORAL_TABLET | Freq: Every day | ORAL | 3 refills | Status: DC

## 2021-01-13 ENCOUNTER — Other Ambulatory Visit: Payer: Self-pay

## 2021-01-13 ENCOUNTER — Ambulatory Visit: Payer: Medicare PPO | Admitting: Internal Medicine

## 2021-01-13 ENCOUNTER — Encounter: Payer: Self-pay | Admitting: Internal Medicine

## 2021-01-13 ENCOUNTER — Other Ambulatory Visit: Payer: Self-pay | Admitting: Internal Medicine

## 2021-01-13 VITALS — BP 142/68 | HR 65 | Temp 97.5°F | Ht 67.0 in | Wt 154.0 lb

## 2021-01-13 DIAGNOSIS — Z8709 Personal history of other diseases of the respiratory system: Secondary | ICD-10-CM | POA: Diagnosis not present

## 2021-01-13 DIAGNOSIS — J309 Allergic rhinitis, unspecified: Secondary | ICD-10-CM

## 2021-01-13 DIAGNOSIS — F0391 Unspecified dementia with behavioral disturbance: Secondary | ICD-10-CM

## 2021-01-13 DIAGNOSIS — J329 Chronic sinusitis, unspecified: Secondary | ICD-10-CM

## 2021-01-13 DIAGNOSIS — N1832 Chronic kidney disease, stage 3b: Secondary | ICD-10-CM

## 2021-01-13 DIAGNOSIS — E785 Hyperlipidemia, unspecified: Secondary | ICD-10-CM

## 2021-01-13 DIAGNOSIS — R7303 Prediabetes: Secondary | ICD-10-CM

## 2021-01-13 DIAGNOSIS — I10 Essential (primary) hypertension: Secondary | ICD-10-CM | POA: Diagnosis not present

## 2021-01-13 MED ORDER — SALINE SPRAY 0.65 % NA SOLN: 2.0000 | NASAL | 11 refills | Status: DC | PRN

## 2021-01-13 MED ORDER — AZELASTINE-FLUTICASONE 137-50 MCG/ACT NA SUSP: 1.0000 | Freq: Two times a day (BID) | NASAL | 11 refills | Status: DC | PRN

## 2021-01-13 MED ORDER — AMOXICILLIN-POT CLAVULANATE 875-125 MG PO TABS: 1.0000 | ORAL_TABLET | Freq: Two times a day (BID) | ORAL | 0 refills | Status: DC

## 2021-01-13 MED ORDER — LEVOCETIRIZINE DIHYDROCHLORIDE 5 MG PO TABS: 2.5000 mg | ORAL_TABLET | Freq: Every evening | ORAL | 3 refills | Status: DC | PRN

## 2021-01-13 NOTE — Progress Notes (Signed)
Chief Complaint  Patient presents with   Follow-up    4 mo f/u. Complaining of lots of congestion. Daughter ( caregiver) has noticed more of a decline; zoning out, not as aware, sleeping more.    F/u with daughter [name]  1. No exposure to covid c/o congestion on zyrtec mucinex pills, h/o chronic sinus and nasal polyps with 2 surgeries to remove 15 years ago and since lost since of smell but daughter thinks she is more congested nasally though she does not complain  2. Dementia with decline disc consider repeat ct 2016 ct with chronic sinus disease, intracranial atherosclerosis mild, mild atrophy she is having increased sleep. Daughter has put camera in bedroom and bathroom no falls but daughter thinks pt is having visual hallucinations  3. Reduced vision and depth perception daughter thinks unable to see foods even on a white plate at home has appt AE 02/2021   4. CKD 3 repeat labs   Review of Systems  Constitutional: Negative for weight loss.  HENT: Positive for congestion. Negative for hearing loss and sinus pain.   Eyes: Negative for blurred vision.  Respiratory: Negative for shortness of breath.   Cardiovascular: Negative for chest pain.  Gastrointestinal: Negative for abdominal pain.  Skin: Negative for rash.  Psychiatric/Behavioral: Negative for depression.   Past Medical History:  Diagnosis Date   Allergy    Alzheimer's dementia (HCC)    CT head 08/25/15 mild cortical atrophy and chronic small vessel ischemic change. atheroscloerosis. paranasal sinus disease    Anosmia    Asthma    Chicken pox    COPD (chronic obstructive pulmonary disease) (HCC)    Emphysema of lung (HCC)    per daughter never smoker exposed 2nd have via husband   Hard of hearing    since childhood   Hypertension    Nasal polyps    surgery x 15 years ago and in ~2013 as of 10/08/17   Syncope    03/2017 thought 2/2 diuretic stopped in [facility]    UTI (urinary tract infection)    Past  Surgical History:  Procedure Laterality Date   ABDOMINAL HYSTERECTOMY     ? year ? reason ; partial    BREAST BIOPSY     ? year    nasal polyps removed     Family History  Problem Relation Age of Onset   Cancer Mother        ? type   Other Mother        scarlett fever, skin ulcer, died of complications after MVA    Arthritis Mother    Alcohol abuse Paternal Uncle    Arthritis Father    Social History   Socioeconomic History   Marital status: Widowed    Spouse name: Not on file   Number of children: Not on file   Years of education: Not on file   Highest education level: Not on file  Occupational History   Not on file  Tobacco Use   Smoking status: Former Smoker   Smokeless tobacco: Never Used  Substance and Sexual Activity   Alcohol use: No   Drug use: No   Sexual activity: Never  Other Topics Concern   Not on file  Social History Narrative   Lives with daughter [name] since 25 or 08/2017 just moved from [facility] (also in home grandson and son in Social worker)   Widowed   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 With food   aspirin EC 81 MG tablet Take 81 mg by mouth daily.   Azelastine-Fluticasone 137-50 MCG/ACT SUSP Place 1 spray into the nose 2 (two) times daily as needed.   donepezil (ARICEPT) 10 MG tablet Take 1 tablet (10 mg total) by mouth at bedtime.   Fluticasone-Umeclidin-Vilant (TRELEGY ELLIPTA) 100-62.5-25 MCG/INH AEPB Inhale 1 puff into the lungs daily. Rinse mouth after use    FLUZONE HIGH-DOSE 0.5 ML injection TO BE ADMINISTERED BY PHARMACIST FOR IMMUNIZATION   Ibuprofen (ADVIL PO) Take by mouth.   levocetirizine (XYZAL) 5 MG tablet Take 0.5 tablets (2.5 mg total) by mouth at bedtime as needed for allergies.   losartan (COZAAR) 25 MG tablet Take 1 tablet (25 mg total) by mouth daily.   memantine (NAMENDA) 10 MG tablet Take 1 tablet (10 mg total) by mouth 2 (two) times daily.   mirtazapine (REMERON) 7.5 MG tablet Take 1 tablet (7.5 mg total) by mouth at bedtime.   PREVNAR 13 SUSP injection TO BE ADMINISTERED BY PHARMACIST FOR IMMUNIZATION   sodium chloride (OCEAN) 0.65 % SOLN nasal spray Place 2 sprays into both nostrils as needed for congestion.   [DISCONTINUED] cetirizine (ZYRTEC) 10 MG tablet Take 1 tablet (10 mg total) by mouth daily.   [DISCONTINUED] doxycycline (VIBRA-TABS) 100 MG tablet Take 1 tablet (100 mg total) by mouth 2 (two) times daily.   Allergies  Allergen Reactions   Latex     rash   Skin Adhesives [Cyanoacrylate]    No results found for this or any previous visit (from the past 2160 hour(s)). Objective  Body mass index is 24.12 kg/m. Wt Readings from Last 3 Encounters:  01/13/21 154 lb (69.9 kg)  09/15/20 156 lb 6.4 oz (70.9 kg)  02/25/20 161 lb 3.2 oz (73.1 kg)   Temp Readings from Last 3 Encounters:  01/13/21 (!) 97.5 F (36.4 C)  09/15/20 98.1 F (36.7 C) (Oral)  01/23/20 (!) 97.1 F (36.2 C) (Temporal)   BP Readings from Last 3 Encounters:  01/13/21 (!) 142/68  09/15/20 118/64  01/23/20 114/70   Pulse Readings from Last 3 Encounters:  01/13/21 65  09/15/20 71  01/23/20 78    Physical Exam Vitals and nursing note reviewed.  Constitutional:      Appearance: Normal appearance. She is well-developed and well-groomed.  HENT:     Head: Normocephalic and atraumatic.     Nose:     Right Sinus: No maxillary sinus tenderness or frontal sinus tenderness.     Left Sinus: No maxillary sinus tenderness or frontal  sinus tenderness.  Cardiovascular:     Rate and Rhythm: Normal rate and regular rhythm.     Heart sounds: Normal heart sounds. No murmur heard.   Pulmonary:     Effort: Pulmonary effort is normal.     Breath sounds: Normal breath sounds.  Skin:    General: Skin is warm and dry.  Neurological:     General: No focal deficit present.     Mental  Status: She is alert and oriented to person, place, and time. Mental status is at baseline.     Gait: Gait normal.  Psychiatric:        Attention and Perception: Attention and perception normal.        Mood and Affect: Mood and affect normal.        Speech: Speech normal.        Behavior: Behavior normal. Behavior is cooperative.        Thought Content: Thought content normal.        Cognition and Memory: Cognition and memory normal.        Judgment: Judgment normal.     Assessment  Plan  Allergic rhinitis, sinusitis - Plan: Azelastine-Fluticasone 137-50 MCG/ACT SUSP, sodium chloride (OCEAN) 0.65 % SOLN nasal spray, levocetirizine (XYZAL) 5 MG tablet, Ambulatory referral to ENT History of nasal polyp - Plan: Ambulatory referral to ENT  Augmentin bid #14  Stop zyrtec   Stage 3b chronic kidney disease (HCC) - Plan: Comprehensive metabolic panel  Hyperlipidemia, unspecified hyperlipidemia type - Plan: Lipid panel  Hypertension, unspecified type - Plan: Comprehensive metabolic panel, Lipid panel, CBC with Differential/Platelet On losartan 25 mg qd   Prediabetes - Plan: Hemoglobin A1c  Dementia with behavioral changes with hallucinations Consider CT scan and [facility]  On aricept 10 mg qd   HM Had fluutd prevnar 08/11/17. Tdap utd shingrixconsider in future pna 23had 07/15/18 Possibly had zostavx 09/2014 3/3 covid vxs hadpfizer   Dermatology seen in 2022    Eye Dr. Leonard Schwartz Nice apptseen and reported constricted pupils but vision ok-previous apptreferred to ophthalmology appt sch 02/2021 AE  09/02/14 DEXA  reviewed +osteopenia ? If ever had colonoscopy, ? Last mammogram, s/p hysterectomyOut of age windowforall health maintenance  Provider: Dr. French Ana McLean-Scocuzza-Internal Medicine

## 2021-01-13 NOTE — Patient Instructions (Addendum)
Let me know if you want to do the CT scan and see [facility] Consider Ear nose and throat-[facility] let me know  Consider daily multivitamin nature made or centrum    Alzheimer's Disease Alzheimer's disease is a brain disease that affects memory, thinking, language, and behavior. People with Alzheimer's disease lose mental abilities, and the disease gets worse over time. Alzheimer's disease is a form of dementia. What are the causes? This condition develops when a protein called beta-amyloid forms deposits in the brain. It is not known what causes these deposits to form. Alzheimer's disease may also be caused by a gene mutation that is inherited from one parent or both parents. A gene mutation is a harmful change in a gene. Not everyone who inherits the genetic mutation will get the disease. What increases the risk? You are more likely to develop this condition if you:  Are older than age 85.  Are female.  Have any of these medical conditions: ? High blood pressure. ? Diabetes. ? Heart or blood vessel disease.  Smoke.  Have obesity.  Have had a brain injury.  Have had a stroke.  Have a family history of dementia. What are the signs or symptoms? Symptoms of this condition may happen in three stages, which often overlap. Early stage In this stage, you may continue to be independent. You may still be able to drive, work, and be social. Symptoms in this stage include:  Minor memory problems, such as forgetting a name, words, or what you did recently.  Difficulty with: ? Paying attention. ? Communicating. ? Doing familiar tasks. ? Problem solving or doing calculations. ? Following instructions. ? Learning new things.  Anxiety.  Social withdrawal.  Loss of motivation. Moderate stage In this stage, you will start to need care. Symptoms in this stage include:  Difficulty with expressing thoughts.  Memory loss that affects daily life. This  can include forgetting: ? Recent events that have happened. ? If you have taken medicines or eaten. ? Familiar places. You may get lost while walking or driving. ? To pay bills or manage finances. ? Personal hygiene such as bathing or using the bathroom.  Confusion about where you are or what time it is.  Difficulty in judging distance.  Changes in personality, mood, and behavior. You may be moody, irritable, angry, frustrated, fearful, anxious, or suspicious.  Poor reasoning and judgment.  Delusions or hallucinations.  Changes in sleep patterns. Severe stage In the final stage, you will need help with your personal care and daily activities. Symptoms in this stage include:  Worsening memory loss.  Personality changes.  Loss of awareness of your surroundings.  Changes in physical abilities, including the ability to walk, sit, and swallow.  Difficulty in communicating.  Inability to control your bladder and bowels.  Increasing confusion.  Increasing behavior changes. How is this diagnosed? This condition is diagnosed by a health care provider who specializes in diseases of the nervous system (neurologist) or one who specializes in care of the elderly (geriatrician or geriatric psychiatrist). Other causes of dementia may also be ruled out. Your health care provider will talk with you and your family, friends, or caregivers about your history and symptoms. A thorough medical history will be taken, and you will have a physical exam and tests. Tests may include:  Lab tests, such as blood or urine tests.  Imaging tests, such as a CT scan, a PET scan, or an MRI.  A lumbar puncture.  This test involves removing and testing a small amount of the fluid that surrounds the brain and spinal cord.  An electroencephalogram (EEG). In this test, small metal discs are used to measure electrical activity in the brain.  Memory tests, cognitive tests, and neuropsychological tests. These  tests evaluate brain function.  Genetic testing. This may be done if you have early onset of the disease (before age 48) or if other family members have the disease.   How is this treated? At this time, there is no treatment to cure Alzheimer's disease or stop it from getting worse. The goals of treatment are:  To manage behavioral changes.  To provide you with a safe environment.  To help manage daily life for you and your caregivers. The following treatment options are available:  Medicines. Medicines may help the memory work better and manage behavioral symptoms.  Cognitive therapy. Cognitive therapy provides you with education, support, and memory aids. It is most helpful in the early stages of the condition.  Counseling or spiritual guidance. It is normal to have a lot of feelings, including anger, relief, fear, and isolation. Counseling and guidance can help you deal with these feelings.  Caregiving. This involves having caregivers help you with your daily activities.  Family support groups. These provide education, emotional support, and information about community resources to family members who are taking care of you. Follow these instructions at home: Medicines  Take over-the-counter and prescription medicines only as told by your health care provider.  Use a pill organizer or pill reminder to help you manage your medicines.  Avoid taking medicines that can affect thinking, such as pain medicines or sleeping medicines. Lifestyle  Make healthy lifestyle choices: ? Be physically active as told by your health care provider. Regular exercise may help improve symptoms. ? Do not use any products that contain nicotine or tobacco, such as cigarettes, e-cigarettes, and chewing tobacco. If you need help quitting, ask your health care provider. ? Do not drink alcohol. ? Eat a healthy diet. ? Practice stress-management techniques when you get stressed. ? Stay social.  Drink  enough fluid to keep your urine pale yellow.  Make sure to get quality sleep. ? Avoid taking long naps during the day. Take short naps of 30 minutes or less if needed. ? Keep your sleeping area dark and cool. ? Avoid exercising during the few hours before you go to bed. ? Avoid caffeine products in the afternoon and evening. General instructions  Work with your health care provider to determine what you need help with and what your safety needs are.  If you were given a bracelet that identifies you as a person with memory loss or tracks your location, make sure to wear it at all times.  Talk with your health care provider about whether it is safe for you to drive.  Work with your family to make important decisions, such as advance directives, medical power of attorney, or a living will.  Keep all follow-up visits. This is important.   Where to find more information  The Alzheimer's Association: Call the 24-hour helpline at 810-424-1799, or visit LimitLaws.hu Contact a health care provider if:  You have nausea, vomiting, or trouble with eating related to a medicine.  You have worsening mood or behavior changes, such as depression, anxiety, or hallucinations.  You or your family members become concerned for your safety. Get help right away if:  You become less responsive or are difficult to wake up.  Your memory suddenly gets worse.  You feel that you want to harm yourself. If you ever feel like you may hurt yourself or others, or have thoughts about taking your own life, get help right away. Go to your nearest emergency department or:  Call your local emergency services (911 in the [place]  Call a suicide crisis helpline, such as the National Suicide Prevention Lifeline at 262-243-2530. This is open 24 hours a day in the [place]  Text the Crisis Text Line at 229-437-9479 (in the [place] Summary  Alzheimer's disease is a brain disease that affects memory, thinking, language, and  behavior. Alzheimer's disease is a form of dementia.  This condition is diagnosed by a specialist in diseases of the nervous system (neurologist) or one who specializes in care of the elderly.  At this time, there is no treatment to cure Alzheimer's disease or stop it from getting worse. The goal of treatment is to help you manage any symptoms.  Work with your family to make important decisions, such as advance directives, medical power of attorney, or a living will. This information is not intended to replace advice given to you by your health care provider. Make sure you discuss any questions you have with your health care provider. Document Revised: 02/09/2020 Document Reviewed: 02/09/2020 Elsevier Patient Education  2021 Elsevier Inc.  Alzheimer's Disease Caregiver Guide Alzheimer's disease is a brain disease that causes memory loss and changes in behavior. People with Alzheimer's disease often have problems paying attention, communicating, and doing routine tasks. The disease gets worse over time, and people with the disease eventually need full-time care. Taking care of someone with Alzheimer's disease can be challenging and overwhelming. This guide provides helpful information and tips that can make caring for someone with the disease a little easier. How to help manage lifestyle changes Managing symptoms  Be calm and patient.  Give short, simple answers to questions. Long answers can overwhelm and confuse the person.  Avoid correcting the person in a negative way.  Try not to take things personally, even if the person forgets your name. Understand that changes are a part of the disease process.  Do not argue or try to convince the person about a specific point. Doing that may make the person feel more agitated. Reducing frustration  Make appointments and do daily tasks, like bathing and dressing, when the person is at his or her best.  Allow for plenty of time for simple tasks  because they may take longer than expected. Take your time when doing these tasks.  Limit the person's choices. Too many choices can be overwhelming and stressful for the person.  Involve the person in what you are doing.  Take steps to help keep things organized such as: ? Keeping a daily routine. ? Organizing medicines in a pillbox for each day of the week. ? Keeping a calendar in a central location to remind the person of appointments or other activities.  Avoid crowds and new situations, if possible.  Use simple words, short sentences, and a calm voice. Only give one direction at a time.  Buy clothes and shoes for the person that are easy to put on and take off.  Try to change the subject or topic if the person becomes frustrated or angry. Distraction is a great technique for making a situation less tense. Reducing the risk of injury  Keep floors clear of clutter. Remove rugs, magazine racks, and floor lamps.  Keep hallways well-lit, especially at night.  Put a handrail and nonslip mat in the bathtub or shower.  Put childproof locks on cabinets that contain dangerous items, such as medicines, alcohol, guns, toxic cleaning items, sharp tools or utensils, matches, and lighters.  Put locks on doors. Put the locks in places where the person cannot see or reach them easily. This will help ensure that the person does not wander out of the house and get lost.  Be prepared for emergencies. Keep a list of emergency phone numbers and addresses in a convenient area.  Remove car keys and lock garage doors so that the person does not try to get in the car and drive.  A certain type of bracelet may be worn that tracks a person's location and identifies him or her as having memory problems. This should be worn at all times for safety.   Planning for the future  Discuss financial and legal planning early on in the course of the disease. People with Alzheimer's disease will have trouble  managing their money as the disease gets worse. Get help from professional advisers regarding financial and legal matters.  Discuss advance directives, safety, and daily care. Take these steps: ? Create a living will and choose a power of attorney. The person with power of attorney will be able to make decisions for the person with Alzheimer's disease when he or she is no longer able to. ? Discuss driving safety and when to stop driving. The person's health care provider can help provide assistance with this decision. ? Discuss the person's living situation. If the person lives alone, make sure he or she is safe. People who live at home may need extra help from home health caregivers, and those who live in a nursing home or care center may need more care.   How to recognize changes in the person's condition Alzheimer's disease causes a person to lose the ability to remember things and make decisions. Memory loss and confusion are usually mild at the start of the disease, but they slowly get more severe over time. Eventually, the person may not recognize friends, family members, or familiar places. Alzheimer's disease can also cause hallucinations, changes in behavior, and changes in mood, such as anxiety or anger. The changes can come on suddenly. They may happen in response to something such as:  Pain.  An infection.  Changes in environment, such as changes in temperature or noise.  Overstimulation.  Feeling lost or scared.  Medicines. Where to find support  Ask about respite care resources. Respite care can provide short-term care for the person so that you can have a regular break from the stress of caregiving.  Consider joining a local support group. Advantages of being part of a support group include: ? Learning strategies to manage stress. ? Sharing experiences with others. ? Receiving emotional comfort and support. ? Learning about caregiving as the disease progresses. ? Knowing  what community resources are available and making use of them. Where to find more information  Alzheimer's Association: LimitLaws.hu Contact a health care provider if:  The person has a fever.  The person has a sudden change in behavior that does not improve with calming strategies.  The person is unable to manage in his or her current living situation.  You are no longer able to care for the person. Get help right away if:  The person has a sudden increase in confusion or new hallucinations.  The person threatens himself or herself, you, or anyone else. If you  ever feel like your loved one may hurt himself or herself or others, or shares thoughts about taking his or her own life, get help right away. You can go to your nearest emergency department or:  Call your local emergency services (911 in the [place]  Call a suicide crisis helpline, such as the National Suicide Prevention Lifeline at (808) 390-3622. This is open 24 hours a day in the [place]  Text the Crisis Text Line at 9080151251 (in the [place] Summary  Alzheimer's disease is a brain disease that causes memory loss and changes in behavior.  People with Alzheimer's disease often have problems paying attention, communicating, and doing routine tasks. The disease gets worse over time, and people with the disease eventually need full-time care.  Take steps to reduce the person's risk of injury and plan for future care.  Taking care of someone with Alzheimer's disease can be very challenging and overwhelming. One way to find support during this time is to join a local support group. This information is not intended to replace advice given to you by your health care provider. Make sure you discuss any questions you have with your health care provider. Document Revised: 02/09/2020 Document Reviewed: 02/09/2020 Elsevier Patient Education  2021 Elsevier Inc.  Dementia Caregiver Guide Dementia is a term used to describe a number of  symptoms that affect memory and thinking. The most common symptoms include:  Memory loss.  Trouble with language and communication.  Trouble concentrating.  Poor judgment and problems with reasoning.  Wandering from home or public places.  Extreme anxiety or depression.  Being suspicious or having angry outbursts and accusations.  Child-like behavior and language. Dementia can be frightening and confusing. And taking care of someone with dementia can be challenging. This guide provides tips to help you when providing care for a person with dementia. How to help manage lifestyle changes Dementia usually gets worse slowly over time. In the early stages, people with dementia can stay independent and safe with some help. In later stages, they need help with daily tasks such as dressing, grooming, and using the bathroom. There are actions you can take to help a person manage his or her life while living with this condition. Communicating  When the person is talking or seems frustrated, make eye contact and hold the person's hand.  Ask specific questions that need yes or no answers.  Use simple words, short sentences, and a calm voice. Only give one direction at a time.  When offering choices, limit the person to just one or two.  Avoid correcting the person in a negative way.  If the person is struggling to find the right words, gently try to help him or her. Preventing injury  Keep floors clear of clutter. Remove rugs, magazine racks, and floor lamps.  Keep hallways well lit, especially at night.  Put a handrail and nonslip mat in the bathtub or shower.  Put childproof locks on cabinets that contain dangerous items, such as medicines, alcohol, guns, toxic cleaning items, sharp tools or utensils, matches, and lighters.  For doors to the outside of the house, put the locks in places where the person cannot see or reach them easily. This will help ensure that the person does not  wander out of the house and get lost.  Be prepared for emergencies. Keep a list of emergency phone numbers and addresses in a convenient area.  Remove car keys and lock garage doors so that the person does not try to  get in the car and drive.  Have the person wear a bracelet that tracks locations and identifies the person as having memory problems. This should be worn at all times for safety.   Helping with daily life  Keep the person on track with his or her routine.  Try to identify areas where the person may need help.  Be supportive, patient, calm, and encouraging.  Gently remind the person that adjusting to changes takes time.  Help with the tasks that the person has asked for help with.  Keep the person involved in daily tasks and decisions as much as possible.  Encourage conversation, but try not to get frustrated if the person struggles to find words or does not seem to appreciate your help.   How to recognize stress Look for signs of stress in yourself and in the person you are caring for. If you notice signs of stress, take steps to manage it. Symptoms of stress include:  Feeling anxious, irritable, frustrated, or angry.  Denying that the person has dementia or that his or her symptoms will not improve.  Feeling depressed, hopeless, or unappreciated.  Difficulty sleeping.  Difficulty concentrating.  Developing stress-related health problems.  Feeling like you have too little time for your own life. Follow these instructions at home: Take care of your health Make sure that you and the person you are caring for:  Get regular sleep.  Exercise regularly.  Eat regular, nutritious meals.  Take over-the-counter and prescription medicines only as told by your health care providers.  Drink enough fluid to keep your urine pale yellow.  Attend all scheduled health care appointments.   General instructions  Join a support group with others who are  caregivers.  Ask about respite care resources. Respite care can provide short-term care for the person so that you can have a regular break from the stress of caregiving.  Consider any safety risks and take steps to avoid them.  Organize medicines in a pill box for each day of the week.  Create a plan to handle any legal or financial matters. Get legal or financial advice if needed.  Keep a calendar in a central location to remind the person of appointments or other activities. Where to find support: Many individuals and organizations offer support. These include:  Support groups for people with dementia.  Support groups for caregivers.  Counselors or therapists.  Home health care services.  Adult day care centers. Where to find more information  Centers for Disease Control and Prevention: FootballExhibition.com.br  Alzheimer's Association: LimitLaws.hu  Family Caregiver Alliance: www.caregiver.org  Alzheimer's Foundation of [place]: www.alzfdn.org Contact a health care provider if:  The person's health is rapidly getting worse.  You are no longer able to care for the person.  Caring for the person is affecting your physical and emotional health.  You are feeling depressed or anxious about caring for the person. Get help right away if:  The person threatens himself or herself, you, or anyone else.  You feel depressed or sad, or feel that you want to harm yourself. If you ever feel like your loved one may hurt himself or herself or others, or if he or she shares thoughts about taking his or her own life, get help right away. You can go to your nearest emergency department or:  Call your local emergency services (911 in the [place]  Call a suicide crisis helpline, such as the National Suicide Prevention Lifeline at 706-523-7976. This is open 24  hours a day in the [place]  Text the Crisis Text Line at 208-557-9958 (in the [place] Summary  Dementia is a term used to describe a number of  symptoms that affect memory and thinking.  Dementia usually gets worse slowly over time.  Take steps to reduce the person's risk of injury and to plan for future care.  Caregivers need support, relief from caregiving, and time for their own lives. This information is not intended to replace advice given to you by your health care provider. Make sure you discuss any questions you have with your health care provider. Document Revised: 03/08/2020 Document Reviewed: 03/08/2020 Elsevier Patient Education  2021 Elsevier Inc.  Dementia Dementia is a condition that affects the way the brain functions. It often affects memory and thinking. Usually, dementia gets worse with time and cannot be reversed (progressive dementia). There are many types of dementia, including:  Alzheimer's disease. This type is the most common.  Vascular dementia. This type may happen as the result of a stroke.  Lewy body dementia. This type may happen to people who have Parkinson's disease.  Frontotemporal dementia. This type is caused by damage to nerve cells (neurons) in certain parts of the brain. Some people may be affected by more than one type of dementia. This is called mixed dementia. What are the causes? Dementia is caused by damage to cells in the brain. The area of the brain and the types of cells damaged determine the type of dementia. Usually, this damage is irreversible or cannot be undone. Some examples of irreversible causes include:  Conditions that affect the blood vessels of the brain, such as diabetes, heart disease, or blood vessel disease.  Genetic mutations. In some cases, changes in the brain may be caused by another condition and can be reversed or slowed. Some examples of reversible causes include:  Injury to the brain.  Certain medicines.  Infection, such as meningitis.  Metabolic problems, such as vitamin B12 deficiency or thyroid disease.  Pressure on the brain, such as from a  tumor, blood clot, or too much fluid in the brain (hydrocephalus).  Autoimmune diseases that affect the brain or arteries, such as limbic encephalitis or vasculitis. What are the signs or symptoms? Symptoms of dementia depend on the type of dementia. Common signs of dementia include problems with remembering, thinking, problem solving, decision making, and communicating. These signs develop slowly or get worse with time. This may include:  Problems remembering events or people.  Having trouble taking a bath or putting clothes on.  Forgetting appointments or forgetting to pay bills.  Difficulty planning and preparing meals.  Having trouble speaking.  Getting lost easily.  Changes in behavior or mood. How is this diagnosed? This condition is diagnosed by a specialist (neurologist). It is diagnosed based on the history of your symptoms, your medical history, a physical exam, and tests. Tests may include:  Tests to evaluate brain function, such as memory tests, cognitive tests, and other tests.  Lab tests, such as blood or urine tests.  Imaging tests, such as a CT scan, a PET scan, or an MRI.  Genetic testing. This may be done if other family members have a diagnosis of certain types of dementia. Your health care provider will talk with you and your family, friends, or caregivers about your history and symptoms.   How is this treated? Treatment for this condition depends on the cause of the dementia. Progressive dementias, such as Alzheimer's disease, cannot be cured, but there  may be treatments that help to manage symptoms. Treatment might involve taking medicines that may help to:  Control the dementia.  Slow down the progression of the dementia.  Manage symptoms. In some cases, treating the cause of your dementia can improve symptoms, reverse symptoms, or slow down how quickly your dementia becomes worse. Your health care provider can direct you to support groups,  organizations, and other health care providers who can help with decisions about your care. Follow these instructions at home: Medicines  Take over-the-counter and prescription medicines only as told by your health care provider.  Use a pill organizer or pill reminder to help you manage your medicines.  Avoid taking medicines that can affect thinking, such as pain medicines or sleeping medicines. Lifestyle  Make healthy lifestyle choices. ? Be physically active as told by your health care provider. ? Do not use any products that contain nicotine or tobacco, such as cigarettes, e-cigarettes, and chewing tobacco. If you need help quitting, ask your health care provider. ? Do not drink alcohol. ? Practice stress-management techniques when you get stressed. ? Spend time with other people.  Make sure to get quality sleep. These tips can help you get a good night's rest: ? Avoid napping during the day. ? Keep your sleeping area dark and cool. ? Avoid exercising during the few hours before you go to bed. ? Avoid caffeine products in the evening. Eating and drinking  Drink enough fluid to keep your urine pale yellow.  Eat a healthy diet. General instructions  Work with your health care provider to determine what you need help with and what your safety needs are.  Talk with your health care provider about whether it is safe for you to drive.  If you were given a bracelet that identifies you as a person with memory loss or tracks your location, make sure to wear it at all times.  Work with your family to make important decisions, such as advance directives, medical power of attorney, or a living will.  Keep all follow-up visits. This is important.   Where to find more information  Alzheimer's Association: LimitLaws.hu  [employer] on Aging: CashCowGambling.be  World Health Organization: https://castaneda-walker.com/ Contact a health care provider if:  You have any new or worsening  symptoms.  You have problems with choking or swallowing. Get help right away if:  You feel depressed or sad, or feel that you want to harm yourself.  Your family members become concerned for your safety. If you ever feel like you may hurt yourself or others, or have thoughts about taking your own life, get help right away. Go to your nearest emergency department or:  Call your local emergency services (911 in the [place]  Call a suicide crisis helpline, such as the National Suicide Prevention Lifeline at 9372044752. This is open 24 hours a day in the [place]  Text the Crisis Text Line at 352-741-6397 (in the [place] Summary  Dementia is a condition that affects the way the brain functions. Dementia often affects memory and thinking.  Usually, dementia gets worse with time and cannot be reversed (progressive dementia).  Treatment for this condition depends on the cause of the dementia.  Work with your health care provider to determine what you need help with and what your safety needs are.  Your health care provider can direct you to support groups, organizations, and other health care providers who can help with decisions about your care. This information is not intended to  replace advice given to you by your health care provider. Make sure you discuss any questions you have with your health care provider. Document Revised: 03/08/2020 Document Reviewed: 03/08/2020 Elsevier Patient Education  2021 [employer].

## 2021-01-14 LAB — HEMOGLOBIN A1C: Hgb A1c MFr Bld: 5.9 % (ref 4.6–6.5)

## 2021-01-14 LAB — CBC WITH DIFFERENTIAL/PLATELET
Basophils Absolute: 0.1 10*3/uL (ref 0.0–0.1)
Basophils Relative: 1 % (ref 0.0–3.0)
Eosinophils Absolute: 0.8 10*3/uL — ABNORMAL HIGH (ref 0.0–0.7)
Eosinophils Relative: 9.2 % — ABNORMAL HIGH (ref 0.0–5.0)
HCT: 44.9 % (ref 36.0–46.0)
Hemoglobin: 14.7 g/dL (ref 12.0–15.0)
Lymphocytes Relative: 22.5 % (ref 12.0–46.0)
Lymphs Abs: 2 10*3/uL (ref 0.7–4.0)
MCHC: 32.8 g/dL (ref 30.0–36.0)
MCV: 93 fl (ref 78.0–100.0)
Monocytes Absolute: 0.6 10*3/uL (ref 0.1–1.0)
Monocytes Relative: 6.7 % (ref 3.0–12.0)
Neutro Abs: 5.4 10*3/uL (ref 1.4–7.7)
Neutrophils Relative %: 60.6 % (ref 43.0–77.0)
Platelets: 206 10*3/uL (ref 150.0–400.0)
RBC: 4.83 Mil/uL (ref 3.87–5.11)
RDW: 15.4 % (ref 11.5–15.5)
WBC: 8.9 10*3/uL (ref 4.0–10.5)

## 2021-01-14 LAB — LIPID PANEL
Cholesterol: 216 mg/dL — ABNORMAL HIGH (ref 0–200)
HDL: 61.6 mg/dL (ref >39.00)
LDL Cholesterol: 122 mg/dL — ABNORMAL HIGH (ref 0–99)
NonHDL: 154.84
Total CHOL/HDL Ratio: 4
Triglycerides: 163 mg/dL — ABNORMAL HIGH (ref 0.0–149.0)
VLDL: 32.6 mg/dL (ref 0.0–40.0)

## 2021-01-14 LAB — COMPREHENSIVE METABOLIC PANEL
ALT: 13 U/L (ref 0–35)
AST: 15 U/L (ref 0–37)
Albumin: 3.9 g/dL (ref 3.5–5.2)
Alkaline Phosphatase: 73 U/L (ref 39–117)
BUN: 25 mg/dL — ABNORMAL HIGH (ref 6–23)
CO2: 30 mEq/L (ref 19–32)
Calcium: 9.5 mg/dL (ref 8.4–10.5)
Chloride: 104 mEq/L (ref 96–112)
Creatinine, Ser: 1.19 mg/dL (ref 0.40–1.20)
GFR: 38.75 mL/min — ABNORMAL LOW (ref >60.00)
Glucose, Bld: 78 mg/dL (ref 70–99)
Potassium: 4.8 mEq/L (ref 3.5–5.1)
Sodium: 141 mEq/L (ref 135–145)
Total Bilirubin: 0.5 mg/dL (ref 0.2–1.2)
Total Protein: 6.6 g/dL (ref 6.0–8.3)

## 2021-01-25 ENCOUNTER — Telehealth: Payer: Self-pay | Admitting: Internal Medicine

## 2021-01-25 NOTE — Telephone Encounter (Signed)
Left message to return call for appointment date.  Patient referred to [facility] and appointment was scheduled for 02/07/21 at 3:00 pm.   Letter sent .

## 2021-02-07 DIAGNOSIS — J339 Nasal polyp, unspecified: Secondary | ICD-10-CM | POA: Diagnosis not present

## 2021-02-07 DIAGNOSIS — J329 Chronic sinusitis, unspecified: Secondary | ICD-10-CM | POA: Diagnosis not present

## 2021-02-11 DIAGNOSIS — H353221 Exudative age-related macular degeneration, left eye, with active choroidal neovascularization: Secondary | ICD-10-CM | POA: Diagnosis not present

## 2021-02-11 DIAGNOSIS — Z0100 Encounter for examination of eyes and vision without abnormal findings: Secondary | ICD-10-CM | POA: Diagnosis not present

## 2021-02-11 DIAGNOSIS — H353112 Nonexudative age-related macular degeneration, right eye, intermediate dry stage: Secondary | ICD-10-CM | POA: Diagnosis not present

## 2021-03-07 DIAGNOSIS — J339 Nasal polyp, unspecified: Secondary | ICD-10-CM | POA: Diagnosis not present

## 2021-03-18 ENCOUNTER — Other Ambulatory Visit: Payer: Self-pay | Admitting: Internal Medicine

## 2021-03-18 DIAGNOSIS — F0390 Unspecified dementia without behavioral disturbance: Secondary | ICD-10-CM

## 2021-04-11 ENCOUNTER — Encounter: Payer: Self-pay | Admitting: Internal Medicine

## 2021-04-12 ENCOUNTER — Other Ambulatory Visit: Payer: Self-pay | Admitting: Internal Medicine

## 2021-04-12 DIAGNOSIS — N3000 Acute cystitis without hematuria: Secondary | ICD-10-CM

## 2021-04-12 NOTE — Telephone Encounter (Signed)
Okay for orders or does Patient need to be seen? If yes I can place the urine orders for you

## 2021-04-12 NOTE — Telephone Encounter (Signed)
Pt's daughter called to check on Mychart message. I let her know that you would let her know once Dr [employer] sends you a message. FYI

## 2021-04-14 ENCOUNTER — Other Ambulatory Visit: Payer: Self-pay

## 2021-04-14 ENCOUNTER — Other Ambulatory Visit (INDEPENDENT_AMBULATORY_CARE_PROVIDER_SITE_OTHER): Payer: Medicare PPO

## 2021-04-14 DIAGNOSIS — N3000 Acute cystitis without hematuria: Secondary | ICD-10-CM

## 2021-04-16 LAB — URINALYSIS, ROUTINE W REFLEX MICROSCOPIC
Bilirubin Urine: NEGATIVE
Glucose, UA: NEGATIVE
Hgb urine dipstick: NEGATIVE
Ketones, ur: NEGATIVE
Nitrite: NEGATIVE
Protein, ur: NEGATIVE
Specific Gravity, Urine: 1.023 (ref 1.001–1.035)
pH: 5.5 (ref 5.0–8.0)

## 2021-04-16 LAB — URINE CULTURE
MICRO NUMBER:: 11988832
SPECIMEN QUALITY:: ADEQUATE

## 2021-04-16 LAB — MICROSCOPIC MESSAGE

## 2021-04-19 ENCOUNTER — Other Ambulatory Visit: Payer: Self-pay | Admitting: Internal Medicine

## 2021-04-19 DIAGNOSIS — N3000 Acute cystitis without hematuria: Secondary | ICD-10-CM

## 2021-04-19 MED ORDER — CIPROFLOXACIN HCL 500 MG PO TABS: 500.0000 mg | ORAL_TABLET | Freq: Two times a day (BID) | ORAL | 0 refills | Status: AC

## 2021-05-06 ENCOUNTER — Other Ambulatory Visit: Payer: Self-pay

## 2021-05-06 ENCOUNTER — Encounter: Payer: Self-pay | Admitting: Internal Medicine

## 2021-05-06 ENCOUNTER — Ambulatory Visit: Payer: Medicare PPO | Admitting: Internal Medicine

## 2021-05-06 VITALS — BP 110/58 | HR 69 | Temp 98.2°F | Ht 67.0 in | Wt 157.2 lb

## 2021-05-06 DIAGNOSIS — F0390 Unspecified dementia without behavioral disturbance: Secondary | ICD-10-CM

## 2021-05-06 DIAGNOSIS — N3000 Acute cystitis without hematuria: Secondary | ICD-10-CM

## 2021-05-06 DIAGNOSIS — F0391 Unspecified dementia with behavioral disturbance: Secondary | ICD-10-CM

## 2021-05-06 DIAGNOSIS — J441 Chronic obstructive pulmonary disease with (acute) exacerbation: Secondary | ICD-10-CM

## 2021-05-06 DIAGNOSIS — I10 Essential (primary) hypertension: Secondary | ICD-10-CM

## 2021-05-06 DIAGNOSIS — F05 Delirium due to known physiological condition: Secondary | ICD-10-CM

## 2021-05-06 DIAGNOSIS — H353220 Exudative age-related macular degeneration, left eye, stage unspecified: Secondary | ICD-10-CM

## 2021-05-06 DIAGNOSIS — H353110 Nonexudative age-related macular degeneration, right eye, stage unspecified: Secondary | ICD-10-CM | POA: Diagnosis not present

## 2021-05-06 MED ORDER — LOSARTAN POTASSIUM 25 MG PO TABS: 25.0000 mg | ORAL_TABLET | Freq: Every day | ORAL | 3 refills | Status: DC

## 2021-05-06 MED ORDER — DONEPEZIL HCL 10 MG PO TABS: 10.0000 mg | ORAL_TABLET | Freq: Every day | ORAL | 3 refills | Status: DC

## 2021-05-06 MED ORDER — TRELEGY ELLIPTA 100-62.5-25 MCG/INH IN AEPB
1.0000 | INHALATION_SPRAY | Freq: Every day | RESPIRATORY_TRACT | 11 refills | Status: DC
Start: 2021-05-06 — End: 2022-03-16

## 2021-05-06 NOTE — Progress Notes (Signed)
Chief Complaint  Patient presents with   Dementia   F/u with daughter mary 1. Dementia declining since 11/2020 unsteady gait walks with cane, increased sleep x 16 hrs and confusion, trouble doing simple tasks forgetful with meds taking at night and brushing teeth  On aricept 10 mg qd and namenda 10 mg bid not helping   2. Rec 4th covid 19 dose  3, recurrent uti noted by daughter [name] with increased confusion but doing better  Review of Systems  Constitutional:  Negative for weight loss.  HENT:  Negative for hearing loss.   Eyes:  Negative for blurred vision.  Respiratory:  Negative for shortness of breath.   Cardiovascular:  Negative for chest pain.  Skin:  Negative for rash.  Psychiatric/Behavioral:         +increased confusion with h/o dementia Waking up at night   Past Medical History:  Diagnosis Date   Allergy    Alzheimer's dementia (HCC)    CT head 08/25/15 mild cortical atrophy and chronic small vessel ischemic change. atheroscloerosis. paranasal sinus disease    Anosmia    Asthma    Chicken pox    COPD (chronic obstructive pulmonary disease) (HCC)    Emphysema of lung (HCC)    per daughter never smoker exposed 2nd have via husband   Hard of hearing    since childhood   Hypertension    Nasal polyps    surgery x 15 years ago and in ~2013 as of 10/08/17   Syncope    03/2017 thought 2/2 diuretic stopped in [facility]    UTI (urinary tract infection)    Past Surgical History:  Procedure Laterality Date   ABDOMINAL HYSTERECTOMY     ? year ? reason ; partial    BREAST BIOPSY     ? year    nasal polyps removed     Family History  Problem Relation Age of Onset   Cancer Mother        ? type   Other Mother        scarlett fever, skin ulcer, died of complications after MVA    Arthritis Mother    Alcohol abuse Paternal Uncle    Arthritis Father    Social History   Socioeconomic History   Marital status: Widowed    Spouse name: Not on file   Number of children:  Not on file   Years of education: Not on file   Highest education level: Not on file  Occupational History   Not on file  Tobacco Use   Smoking status: Former    Pack years: 0.00   Smokeless tobacco: Never  Substance and Sexual Activity   Alcohol use: No   Drug use: No   Sexual activity: Never  Other Topics Concern   Not on file  Social History Narrative   Lives with daughter [name] since 24 or 08/2017 just moved from [facility] (also in home grandson and son in Social [place])   Widowed   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   Azelastine-Fluticasone 137-50 MCG/ACT SUSP PLACE 1 SPRAY INTO THE NOSE 2 (TWO) TIMES DAILY AS NEEDED.   memantine (NAMENDA) 10 MG tablet TAKE 1 TABLET BY MOUTH TWICE A DAY   sodium chloride (OCEAN) 0.65 % SOLN nasal spray Place 2 sprays into both nostrils as needed for congestion.   [DISCONTINUED] donepezil (ARICEPT) 10 MG tablet Take 1 tablet (10 mg total) by mouth at bedtime.   [DISCONTINUED] Fluticasone-Umeclidin-Vilant (TRELEGY ELLIPTA) 100-62.5-25 MCG/INH AEPB Inhale 1 puff into the lungs daily. Rinse mouth after use   [DISCONTINUED] losartan (COZAAR) 25 MG tablet Take 1 tablet (25 mg total) by mouth daily.   [DISCONTINUED] mirtazapine (REMERON) 7.5 MG tablet Take 1 tablet (7.5 mg total) by mouth at bedtime.   Allergies  Allergen Reactions   Latex     rash   Skin Adhesives [Cyanoacrylate]    Recent Results (from the past 2160 hour(s))  Urine Culture     Status: Abnormal   Collection Time: 04/14/21  8:28 AM   Specimen: Urine  Result Value Ref  Range   MICRO NUMBER: [id number]    SPECIMEN QUALITY: Adequate    Sample Source NOT GIVEN    STATUS: FINAL    ISOLATE 1: Escherichia coli (A)     Comment: Greater than 100,000 CFU/mL of Escherichia coli      Susceptibility   Escherichia coli - URINE CULTURE, REFLEX    AMOX/CLAVULANIC <=2 Sensitive     AMPICILLIN <=2 Sensitive     AMPICILLIN/SULBACTAM <=2 Sensitive     CEFAZOLIN* <=4 Not Reportable      * For infections other than uncomplicated UTI caused by E. coli, K. pneumoniae or P. mirabilis: Cefazolin is resistant if MIC > or = 8 mcg/mL. (Distinguishing susceptible versus intermediate for isolates with MIC < or = 4 mcg/mL requires additional testing.) For uncomplicated UTI caused by E. coli, K. pneumoniae or P. mirabilis: Cefazolin is susceptible if MIC <32 mcg/mL and predicts susceptible to the oral agents cefaclor, cefdinir, cefpodoxime, cefprozil, cefuroxime, cephalexin and loracarbef.     CEFEPIME <=1 Sensitive     CEFTRIAXONE <=1 Sensitive     CIPROFLOXACIN <=0.25 Sensitive     LEVOFLOXACIN <=0.12 Sensitive     ERTAPENEM <=0.5 Sensitive     GENTAMICIN <=1 Sensitive     IMIPENEM <=0.25 Sensitive     NITROFURANTOIN <=16 Sensitive     PIP/TAZO <=4 Sensitive     TOBRAMYCIN <=1 Sensitive     TRIMETH/SULFA* <=20 Sensitive      * For infections other than uncomplicated UTI caused by E. coli, K. pneumoniae or P. mirabilis: Cefazolin is resistant if MIC > or = 8 mcg/mL. (Distinguishing susceptible versus intermediate for isolates with MIC < or = 4 mcg/mL requires additional testing.) For uncomplicated UTI caused by E. coli, K. pneumoniae or P. mirabilis: Cefazolin is susceptible if MIC <32 mcg/mL and predicts susceptible to the oral agents cefaclor, cefdinir, cefpodoxime, cefprozil, cefuroxime, cephalexin and loracarbef. Legend: S = Susceptible  I = Intermediate R = Resistant  NS = Not susceptible * = Not tested  NR = Not reported **NN = See antimicrobic comments    Urinalysis, Routine w reflex microscopic     Status: Abnormal   Collection Time: 04/14/21  8:28 AM  Result Value Ref Range   Color, Urine YELLOW YELLOW   APPearance CLOUDY (A) CLEAR   Specific Gravity, Urine 1.023 1.001 - 1.035   pH 5.5 5.0 -  8.0   Glucose, UA NEGATIVE NEGATIVE   Bilirubin Urine NEGATIVE NEGATIVE   Ketones, ur NEGATIVE NEGATIVE   Hgb urine dipstick NEGATIVE NEGATIVE   Protein, ur NEGATIVE NEGATIVE   Nitrite NEGATIVE NEGATIVE   Leukocytes,Ua 2+ (A) NEGATIVE  MICROSCOPIC MESSAGE     Status: None   Collection Time: 04/14/21  8:28 AM  Result Value Ref Range   Note      Comment: This urine was analyzed for the presence of WBC,  RBC, bacteria, casts, and other formed elements.  Only those elements seen were reported. . .    Objective  Body mass index is 24.62 kg/m. Wt Readings from Last 3 Encounters:  05/06/21 157 lb 3.2 oz (71.3 kg)  01/13/21 154 lb (69.9 kg)  09/15/20 156 lb 6.4 oz (70.9 kg)   Temp Readings from Last 3 Encounters:  05/06/21 98.2 F (36.8 C) (Oral)  01/13/21 (!) 97.5 F (36.4 C)  09/15/20 98.1 F (36.7 C) (Oral)   BP Readings from Last 3 Encounters:  05/06/21 (!) 110/58  01/13/21 (!) 142/68  09/15/20 118/64   Pulse Readings from Last 3 Encounters:  05/06/21 69  01/13/21 65  09/15/20 71    Physical Exam Vitals and nursing note reviewed.  Constitutional:      Appearance: Normal appearance. She is well-developed and well-groomed.  HENT:     Head: Normocephalic and atraumatic.  Eyes:     Conjunctiva/sclera: Conjunctivae normal.     Pupils: Pupils are equal, round, and reactive to light.  Cardiovascular:     Rate and Rhythm: Normal rate and regular rhythm.     Heart sounds: Normal heart sounds. No murmur heard. Pulmonary:     Effort: Pulmonary effort is normal.     Breath sounds: Normal breath sounds.  Abdominal:     General: Abdomen is flat. Bowel sounds are normal.  Skin:    General: Skin is warm and dry.   Neurological:     General: No focal deficit present.     Mental Status: She is alert and oriented to person, place, and time. Mental status is at baseline.     Gait: Gait normal.  Psychiatric:        Attention and Perception: Attention and perception normal.        Mood and Affect: Mood and affect normal.        Speech: Speech normal.        Behavior: Behavior normal. Behavior is cooperative.        Thought Content: Thought content normal.        Cognition and Memory: Cognition and memory normal.        Judgment: Judgment normal.   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 French Ana McLean-Scocuzza-Internal Medicine

## 2021-05-06 NOTE — Patient Instructions (Addendum)
Read about sundowning   Dementia Caregiver Guide Dementia is a term used to describe a number of symptoms that affect memory and thinking. The most common symptoms include: Memory loss. Trouble with language and communication. Trouble concentrating. Poor judgment and problems with reasoning. Wandering from home or public places. Extreme anxiety or depression. Being suspicious or having angry outbursts and accusations. Child-like behavior and language. Dementia can be frightening and confusing. And taking care of someone with dementia can be challenging. This guide provides tips to help you whenproviding care for a person with dementia. How to help manage lifestyle changes Dementia usually gets worse slowly over time. In the early stages, people with dementia can stay independent and safe with some help. In later stages, they need help with daily tasks such as dressing, grooming, and using the bathroom. There are actions you can take to help a person manage his or her life whileliving with this condition. Communicating When the person is talking or seems frustrated, make eye contact and hold the person's hand. Ask specific questions that need yes or no answers. Use simple words, short sentences, and a calm voice. Only give one direction at a time. When offering choices, limit the person to just one or two. Avoid correcting the person in a negative way. If the person is struggling to find the right words, gently try to help him or her. Preventing injury  Keep floors clear of clutter. Remove rugs, magazine racks, and floor lamps. Keep hallways well lit, especially at night. Put a handrail and nonslip mat in the bathtub or shower. Put childproof locks on cabinets that contain dangerous items, such as medicines, alcohol, guns, toxic cleaning items, sharp tools or utensils, matches, and lighters. For doors to the outside of the house, put the locks in places where the person cannot see or reach  them easily. This will help ensure that the person does not wander out of the house and get lost. Be prepared for emergencies. Keep a list of emergency phone numbers and addresses in a convenient area. Remove car keys and lock garage doors so that the person does not try to get in the car and drive. Have the person wear a bracelet that tracks locations and identifies the person as having memory problems. This should be worn at all times for safety.  Helping with daily life  Keep the person on track with his or her routine. Try to identify areas where the person may need help. Be supportive, patient, calm, and encouraging. Gently remind the person that adjusting to changes takes time. Help with the tasks that the person has asked for help with. Keep the person involved in daily tasks and decisions as much as possible. Encourage conversation, but try not to get frustrated if the person struggles to find words or does not seem to appreciate your help.  How to recognize stress Look for signs of stress in yourself and in the person you are caring for. If you notice signs of stress, take steps to manage it. Symptoms of stress include: Feeling anxious, irritable, frustrated, or angry. Denying that the person has dementia or that his or her symptoms will not improve. Feeling depressed, hopeless, or unappreciated. Difficulty sleeping. Difficulty concentrating. Developing stress-related health problems. Feeling like you have too little time for your own life. Follow these instructions at home: Take care of your health Make sure that you and the person you are caring for: Get regular sleep. Exercise regularly. Eat regular, nutritious meals. Take  over-the-counter and prescription medicines only as told by your health care providers. Drink enough fluid to keep your urine pale yellow. Attend all scheduled health care appointments.  General instructions Join a support group with others who are  caregivers. Ask about respite care resources. Respite care can provide short-term care for the person so that you can have a regular break from the stress of caregiving. Consider any safety risks and take steps to avoid them. Organize medicines in a pill box for each day of the week. Create a plan to handle any legal or financial matters. Get legal or financial advice if needed. Keep a calendar in a central location to remind the person of appointments or other activities. Where to find support: Many individuals and organizations offer support. These include: Support groups for people with dementia. Support groups for caregivers. Counselors or therapists. Home health care services. Adult day care centers. Where to find more information Centers for Disease Control and Prevention: FootballExhibition.com.br Alzheimer's Association: LimitLaws.hu Family Caregiver Alliance: www.caregiver.org Alzheimer's Foundation of [place]: www.alzfdn.org Contact a health care provider if: The person's health is rapidly getting worse. You are no longer able to care for the person. Caring for the person is affecting your physical and emotional health. You are feeling depressed or anxious about caring for the person. Get help right away if: The person threatens himself or herself, you, or anyone else. You feel depressed or sad, or feel that you want to harm yourself. If you ever feel like your loved one may hurt himself or herself or others, or if he or she shares thoughts about taking his or her own life, get help right away. You can go to your nearest emergency department or: Call your local emergency services (911 in the [place] Call a suicide crisis helpline, such as the National Suicide Prevention Lifeline at (905)071-5095. This is open 24 hours a day in the [place] Text the Crisis Text Line at 910-854-4575 (in the [place] Summary Dementia is a term used to describe a number of symptoms that affect memory and  thinking. Dementia usually gets worse slowly over time. Take steps to reduce the person's risk of injury and to plan for future care. Caregivers need support, relief from caregiving, and time for their own lives. This information is not intended to replace advice given to you by your health care provider. Make sure you discuss any questions you have with your healthcare provider. Document Revised: 03/08/2020 Document Reviewed: 03/08/2020 Elsevier Patient Education  2022 [employer].

## 2021-05-08 ENCOUNTER — Other Ambulatory Visit: Payer: Self-pay | Admitting: Internal Medicine

## 2021-05-08 DIAGNOSIS — F32A Depression, unspecified: Secondary | ICD-10-CM

## 2021-05-10 ENCOUNTER — Telehealth: Payer: Self-pay

## 2021-05-10 NOTE — Telephone Encounter (Signed)
Lvm to schedule appt for 4-6 months

## 2021-05-16 DIAGNOSIS — H353221 Exudative age-related macular degeneration, left eye, with active choroidal neovascularization: Secondary | ICD-10-CM | POA: Diagnosis not present

## 2021-05-17 ENCOUNTER — Other Ambulatory Visit (INDEPENDENT_AMBULATORY_CARE_PROVIDER_SITE_OTHER): Payer: Medicare PPO

## 2021-05-17 ENCOUNTER — Other Ambulatory Visit: Payer: Self-pay

## 2021-05-17 DIAGNOSIS — N3000 Acute cystitis without hematuria: Secondary | ICD-10-CM

## 2021-05-19 ENCOUNTER — Other Ambulatory Visit: Payer: Self-pay | Admitting: Internal Medicine

## 2021-05-19 DIAGNOSIS — N3000 Acute cystitis without hematuria: Secondary | ICD-10-CM

## 2021-05-19 LAB — URINE CULTURE
MICRO NUMBER:: 12109092
SPECIMEN QUALITY:: ADEQUATE

## 2021-05-19 MED ORDER — CIPROFLOXACIN HCL 500 MG PO TABS: 500.0000 mg | ORAL_TABLET | Freq: Two times a day (BID) | ORAL | 0 refills | Status: AC

## 2021-05-28 ENCOUNTER — Other Ambulatory Visit: Payer: Self-pay | Admitting: Internal Medicine

## 2021-05-28 DIAGNOSIS — F32A Depression, unspecified: Secondary | ICD-10-CM

## 2021-06-09 DIAGNOSIS — L814 Other melanin hyperpigmentation: Secondary | ICD-10-CM | POA: Diagnosis not present

## 2021-06-09 DIAGNOSIS — L578 Other skin changes due to chronic exposure to nonionizing radiation: Secondary | ICD-10-CM | POA: Diagnosis not present

## 2021-06-09 DIAGNOSIS — L821 Other seborrheic keratosis: Secondary | ICD-10-CM | POA: Diagnosis not present

## 2021-07-29 ENCOUNTER — Emergency Department
Admission: EM | Admit: 2021-07-29 | Discharge: 2021-07-29 | Disposition: A | Discharge status: Home / Self Care | Payer: Medicare PPO | Attending: Emergency Medicine | Admitting: Emergency Medicine

## 2021-07-29 ENCOUNTER — Other Ambulatory Visit: Payer: Self-pay

## 2021-07-29 ENCOUNTER — Encounter: Payer: Self-pay | Admitting: Emergency Medicine

## 2021-07-29 ENCOUNTER — Telehealth: Payer: Self-pay | Admitting: Internal Medicine

## 2021-07-29 DIAGNOSIS — Z91040 Latex allergy status: Secondary | ICD-10-CM | POA: Diagnosis not present

## 2021-07-29 DIAGNOSIS — R220 Localized swelling, mass and lump, head: Secondary | ICD-10-CM | POA: Diagnosis present

## 2021-07-29 DIAGNOSIS — G309 Alzheimer's disease, unspecified: Secondary | ICD-10-CM | POA: Insufficient documentation

## 2021-07-29 DIAGNOSIS — L03211 Cellulitis of face: Secondary | ICD-10-CM

## 2021-07-29 DIAGNOSIS — Z7982 Long term (current) use of aspirin: Secondary | ICD-10-CM | POA: Insufficient documentation

## 2021-07-29 DIAGNOSIS — Z79899 Other long term (current) drug therapy: Secondary | ICD-10-CM | POA: Diagnosis not present

## 2021-07-29 DIAGNOSIS — J449 Chronic obstructive pulmonary disease, unspecified: Secondary | ICD-10-CM | POA: Insufficient documentation

## 2021-07-29 DIAGNOSIS — Z7951 Long term (current) use of inhaled steroids: Secondary | ICD-10-CM | POA: Diagnosis not present

## 2021-07-29 DIAGNOSIS — N1832 Chronic kidney disease, stage 3b: Secondary | ICD-10-CM | POA: Insufficient documentation

## 2021-07-29 DIAGNOSIS — F0280 Dementia in other diseases classified elsewhere without behavioral disturbance: Secondary | ICD-10-CM | POA: Insufficient documentation

## 2021-07-29 DIAGNOSIS — Z87891 Personal history of nicotine dependence: Secondary | ICD-10-CM | POA: Diagnosis not present

## 2021-07-29 DIAGNOSIS — J45909 Unspecified asthma, uncomplicated: Secondary | ICD-10-CM | POA: Diagnosis not present

## 2021-07-29 DIAGNOSIS — I129 Hypertensive chronic kidney disease with stage 1 through stage 4 chronic kidney disease, or unspecified chronic kidney disease: Secondary | ICD-10-CM | POA: Insufficient documentation

## 2021-07-29 MED ORDER — CEPHALEXIN 500 MG PO CAPS
500.0000 mg | ORAL_CAPSULE | Freq: Once | ORAL | Status: AC
  Administered 2021-07-29: 500 mg via ORAL
  Filled 2021-07-29: qty 1

## 2021-07-29 MED ORDER — CEPHALEXIN 500 MG PO CAPS: 500.0000 mg | ORAL_CAPSULE | Freq: Three times a day (TID) | ORAL | 0 refills | Status: DC

## 2021-07-29 NOTE — Telephone Encounter (Signed)
Noted.  Please follow-up with the patient next week to see how she is doing on the Keflex.  Thanks.

## 2021-07-29 NOTE — Telephone Encounter (Signed)
Patient informed, Due to the high volume of calls and your symptoms we have to forward your call to our Triage Nurse to expedient your call. Please hold for the transfer.  Patient transferred to Access Nurse. Due to her left cheek being swollen and causing an uncomfortable feeling.Patient has alzheimers and is unable to speak so her daughter is calling on her behalf.No openings in office or virtual.

## 2021-07-29 NOTE — ED Provider Notes (Signed)
[facility] Emergency Department Provider Note  Time seen: 11:29 AM  I have reviewed the triage vital signs and the nursing notes.   HISTORY  Chief Complaint Facial Swelling  HPI Makayla [name] is a 87 y.o. female with a past medical history of dementia, COPD, hypertension, presents to the emergency department for left facial redness and itching.  According to the daughter she noticed a small bump to the patient's left face yesterday.  States the patient has dementia and often times will scratch or itch if she gets a bump on her face.  Patient's caregiver noted today that the area of redness appeared to have enlarged so she called the daughter and they brought her to the emergency department.  They deny any fever.  Here the patient is calm cooperative pleasant denies any complaints.   Past Medical History:  Diagnosis Date   Allergy    Alzheimer's dementia (HCC)    CT head 08/25/15 mild cortical atrophy and chronic small vessel ischemic change. atheroscloerosis. 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 05/06/2021   History of nasal polyp 01/13/2021   Sinusitis 01/13/2021   Allergic rhinitis 01/13/2021   Recurrent falls 09/21/2020   Fatigue 09/21/2020   Prediabetes 09/21/2020   Vitamin D deficiency 09/21/2020   Reduced vision 09/21/2020   Bilateral hearing loss 09/21/2020   Urinary incontinence  09/21/2020   Stage 3b chronic kidney disease (HCC) 09/21/2020   H/O valvular heart disease 09/21/2020   Arthritis, lumbar spine 02/25/2020   Chronic constipation 01/26/2020   Seborrheic keratoses 07/25/2019   Nail dystrophy 04/21/2019   Depression 04/17/2019   AK (actinic keratosis) 05/13/2018   History of prediabetes 01/07/2018   Abnormal gait 01/07/2018   CKD (chronic kidney disease) stage 3, GFR 30-59 ml/min (HCC) 11/25/2017   COPD (chronic obstructive pulmonary disease) (HCC) 11/25/2017   HLD (hyperlipidemia) 11/25/2017   Arthritis 11/25/2017   Carotid artery stenosis 11/25/2017   Diastolic dysfunction 11/25/2017   Aortic valve sclerosis 11/25/2017   Essential hypertension 10/08/2017   Dementia with behavioral disturbance (HCC) 10/08/2017   Mild intermittent asthma without complication 10/08/2017   Heart murmur 10/08/2017   Thrombophlebitis leg 07/19/2016    Past Surgical History:  Procedure Laterality Date   ABDOMINAL HYSTERECTOMY     ? year ? reason ; partial    BREAST BIOPSY     ? year    nasal polyps removed      Prior to Admission medications   Medication Sig Start Date End Date Taking? Authorizing Provider  aspirin EC 81 MG tablet Take 81 mg by mouth daily.    [provider]  Azelastine-Fluticasone 137-50 MCG/ACT SUSP PLACE 1 SPRAY INTO THE NOSE 2 (TWO) TIMES DAILY AS NEEDED. 01/14/21   McLean-Scocuzza, Pasty Spillers, MD  donepezil (  ARICEPT) 10 MG tablet Take 1 tablet (10 mg total) by mouth at bedtime. 05/06/21   McLean-Scocuzza, Pasty Spillers, MD  Fluticasone-Umeclidin-Vilant (TRELEGY ELLIPTA) 100-62.5-25 MCG/INH AEPB Inhale 1 puff into the lungs daily. Rinse mouth after use 05/06/21   McLean-Scocuzza, Pasty Spillers, MD  losartan (COZAAR) 25 MG tablet Take 1 tablet (25 mg total) by mouth daily. 05/06/21   McLean-Scocuzza, Pasty Spillers, MD  memantine (NAMENDA) 10 MG tablet TAKE 1 TABLET BY MOUTH TWICE A DAY 03/18/21   McLean-Scocuzza, Pasty Spillers, MD  sodium chloride (OCEAN) 0.65 % SOLN  nasal spray Place 2 sprays into both nostrils as needed for congestion. 01/13/21   McLean-Scocuzza, Pasty Spillers, MD    Allergies  Allergen Reactions   Latex     rash   Skin Adhesives [Cyanoacrylate]     Family History  Problem Relation Age of Onset   Cancer Mother        ? type   Other Mother        scarlett fever, skin ulcer, died of complications after MVA    Arthritis Mother    Alcohol abuse Paternal Uncle    Arthritis Father     Social History Social History   Tobacco Use   Smoking status: Former   Smokeless tobacco: Never  Substance Use Topics   Alcohol use: No   Drug use: No    Review of Systems Constitutional: Negative for fever. ENT: Mild swelling itching/redness to left cheek Cardiovascular: Negative for chest pain. Respiratory: Negative for shortness of breath. Gastrointestinal: Negative for abdominal pain, vomiting Musculoskeletal: Negative for musculoskeletal complaints Neurological: Negative for headache All other ROS negative  ____________________________________________   PHYSICAL EXAM:  VITAL SIGNS: ED Triage Vitals  Enc Vitals Group     BP 07/29/21 1121 (!) 144/56     Pulse Rate 07/29/21 1121 66     Resp 07/29/21 1121 18     Temp 07/29/21 1121 97.9 F (36.6 C)     Temp Source 07/29/21 1121 Oral     SpO2 07/29/21 1121 93 %     Weight 07/29/21 1113 157 lb 3 oz (71.3 kg)     Height 07/29/21 1113 5\' 7"  (1.702 m)     Head Circumference --      Peak Flow --      Pain Score 07/29/21 1113 2     Pain Loc --      Pain Edu? --      Excl. in GC? --    Constitutional: Alert, calm and pleasant.  Well appearing and in no distress. Eyes: Normal exam ENT      Head: With mild erythema of the left cheek with small area of excoriation.      Mouth/Throat: Mucous membranes are moist.  No signs of any dental abscess or obvious gingivitis Cardiovascular: Normal rate, regular rhythm.  Respiratory: Normal respiratory effort without tachypnea nor retractions.   Musculoskeletal: Nontender with normal range of motion in all extremities. Neurologic:  Normal speech and language. No gross focal neurologic deficits  Skin: Small area approximately 2 cm in diameter to the left maxillary face of erythema with slight induration surrounding an area of excoriation. Psychiatric: Mood and affect are normal.   ____________________________________________    INITIAL IMPRESSION / ASSESSMENT AND PLAN / ED COURSE  Pertinent labs & imaging results that were available during my care of the patient were reviewed by me and considered in my medical decision making (see chart for details).   Patient presents emergency department  for evaluation of some mild redness to the left face.  Patient has an area of excoriation to the center of a 2 cm diameter area of erythema with slight induration to the area.  Suspect likely early cellulitis.  Does not approach the eye, no fever, no sign of abscess.  No intraoral lesions or signs of abscess.  We will start the patient on Keflex, have the patient follow-up with her doctor.  I discussed very strict return precautions for any enlarging of the red area development of fever or pain.  Family agreeable to plan of care.  Makayla [name] was evaluated in Emergency Department on 07/29/2021 for the symptoms described in the history of present illness. She was evaluated in the context of the global COVID-19 pandemic, which necessitated consideration that the patient might be at risk for infection with the SARS-CoV-2 virus that causes COVID-19. Institutional protocols and algorithms that pertain to the evaluation of patients at risk for COVID-19 are in a state of rapid change based on information released by regulatory bodies including the CDC and federal and state organizations. These policies and algorithms were followed during the patient's care in the ED.  ____________________________________________   FINAL CLINICAL IMPRESSION(S) / ED  DIAGNOSES  Cellulitis   Minna Antis, MD 07/29/21 1133

## 2021-07-29 NOTE — Telephone Encounter (Signed)
Providing access nurse documentation. Received a call from Makayla [name] stating that [name]'s cheek and neck were swollen. Pt was informed to go to the ED for evaluation. Pt was seen in [facility] for Facial Cellulitis.

## 2021-07-29 NOTE — ED Triage Notes (Signed)
Pt comes into the ED via POV with her daughter c/o left cheek swelling.  Pt's daughter states she saw a "red mark" on her cheek last night like it might have been an insect bite.  Pt has now been scratching at the area causing irritation. Pt in NAD at this time and states it doesn't hurt, it is just sore.

## 2021-08-01 ENCOUNTER — Emergency Department: Payer: Medicare PPO

## 2021-08-01 ENCOUNTER — Inpatient Hospital Stay
Admission: EM | Admit: 2021-08-01 | Discharge: 2021-08-03 | DRG: 603 | Disposition: A | Discharge status: Home / Self Care | Payer: Medicare PPO | Attending: Internal Medicine | Admitting: Internal Medicine

## 2021-08-01 ENCOUNTER — Observation Stay: Payer: Medicare PPO

## 2021-08-01 ENCOUNTER — Other Ambulatory Visit: Payer: Self-pay

## 2021-08-01 DIAGNOSIS — Z91040 Latex allergy status: Secondary | ICD-10-CM

## 2021-08-01 DIAGNOSIS — R5383 Other fatigue: Secondary | ICD-10-CM | POA: Diagnosis present

## 2021-08-01 DIAGNOSIS — Z20822 Contact with and (suspected) exposure to covid-19: Secondary | ICD-10-CM | POA: Diagnosis present

## 2021-08-01 DIAGNOSIS — H9193 Unspecified hearing loss, bilateral: Secondary | ICD-10-CM | POA: Diagnosis present

## 2021-08-01 DIAGNOSIS — N183 Chronic kidney disease, stage 3 unspecified: Secondary | ICD-10-CM | POA: Diagnosis present

## 2021-08-01 DIAGNOSIS — F0280 Dementia in other diseases classified elsewhere without behavioral disturbance: Secondary | ICD-10-CM | POA: Diagnosis present

## 2021-08-01 DIAGNOSIS — K118 Other diseases of salivary glands: Secondary | ICD-10-CM | POA: Diagnosis not present

## 2021-08-01 DIAGNOSIS — Z7982 Long term (current) use of aspirin: Secondary | ICD-10-CM

## 2021-08-01 DIAGNOSIS — B029 Zoster without complications: Secondary | ICD-10-CM

## 2021-08-01 DIAGNOSIS — L03211 Cellulitis of face: Secondary | ICD-10-CM | POA: Diagnosis not present

## 2021-08-01 DIAGNOSIS — Z66 Do not resuscitate: Secondary | ICD-10-CM | POA: Diagnosis present

## 2021-08-01 DIAGNOSIS — B958 Unspecified staphylococcus as the cause of diseases classified elsewhere: Secondary | ICD-10-CM | POA: Diagnosis present

## 2021-08-01 DIAGNOSIS — Z23 Encounter for immunization: Secondary | ICD-10-CM | POA: Diagnosis present

## 2021-08-01 DIAGNOSIS — F32A Depression, unspecified: Secondary | ICD-10-CM | POA: Diagnosis present

## 2021-08-01 DIAGNOSIS — R0602 Shortness of breath: Secondary | ICD-10-CM

## 2021-08-01 DIAGNOSIS — N1832 Chronic kidney disease, stage 3b: Secondary | ICD-10-CM | POA: Diagnosis present

## 2021-08-01 DIAGNOSIS — Z8679 Personal history of other diseases of the circulatory system: Secondary | ICD-10-CM

## 2021-08-01 DIAGNOSIS — I129 Hypertensive chronic kidney disease with stage 1 through stage 4 chronic kidney disease, or unspecified chronic kidney disease: Secondary | ICD-10-CM | POA: Diagnosis present

## 2021-08-01 DIAGNOSIS — E785 Hyperlipidemia, unspecified: Secondary | ICD-10-CM | POA: Diagnosis present

## 2021-08-01 DIAGNOSIS — J4520 Mild intermittent asthma, uncomplicated: Secondary | ICD-10-CM | POA: Diagnosis present

## 2021-08-01 DIAGNOSIS — R296 Repeated falls: Secondary | ICD-10-CM | POA: Diagnosis present

## 2021-08-01 DIAGNOSIS — F3289 Other specified depressive episodes: Secondary | ICD-10-CM | POA: Diagnosis not present

## 2021-08-01 DIAGNOSIS — I10 Essential (primary) hypertension: Secondary | ICD-10-CM | POA: Diagnosis not present

## 2021-08-01 DIAGNOSIS — G309 Alzheimer's disease, unspecified: Secondary | ICD-10-CM | POA: Diagnosis present

## 2021-08-01 DIAGNOSIS — Z888 Allergy status to other drugs, medicaments and biological substances status: Secondary | ICD-10-CM

## 2021-08-01 DIAGNOSIS — J449 Chronic obstructive pulmonary disease, unspecified: Secondary | ICD-10-CM | POA: Diagnosis present

## 2021-08-01 DIAGNOSIS — H353110 Nonexudative age-related macular degeneration, right eye, stage unspecified: Secondary | ICD-10-CM | POA: Diagnosis present

## 2021-08-01 DIAGNOSIS — I517 Cardiomegaly: Secondary | ICD-10-CM | POA: Diagnosis not present

## 2021-08-01 DIAGNOSIS — J309 Allergic rhinitis, unspecified: Secondary | ICD-10-CM | POA: Diagnosis present

## 2021-08-01 DIAGNOSIS — L570 Actinic keratosis: Secondary | ICD-10-CM | POA: Diagnosis not present

## 2021-08-01 DIAGNOSIS — L821 Other seborrheic keratosis: Secondary | ICD-10-CM | POA: Diagnosis present

## 2021-08-01 DIAGNOSIS — L03221 Cellulitis of neck: Secondary | ICD-10-CM | POA: Diagnosis not present

## 2021-08-01 DIAGNOSIS — Z87891 Personal history of nicotine dependence: Secondary | ICD-10-CM | POA: Diagnosis not present

## 2021-08-01 DIAGNOSIS — I6529 Occlusion and stenosis of unspecified carotid artery: Secondary | ICD-10-CM | POA: Diagnosis present

## 2021-08-01 DIAGNOSIS — J3489 Other specified disorders of nose and nasal sinuses: Secondary | ICD-10-CM | POA: Diagnosis not present

## 2021-08-01 DIAGNOSIS — Z79899 Other long term (current) drug therapy: Secondary | ICD-10-CM

## 2021-08-01 DIAGNOSIS — Z7951 Long term (current) use of inhaled steroids: Secondary | ICD-10-CM

## 2021-08-01 DIAGNOSIS — E782 Mixed hyperlipidemia: Secondary | ICD-10-CM | POA: Diagnosis not present

## 2021-08-01 DIAGNOSIS — L0390 Cellulitis, unspecified: Secondary | ICD-10-CM | POA: Diagnosis present

## 2021-08-01 DIAGNOSIS — Z90710 Acquired absence of both cervix and uterus: Secondary | ICD-10-CM

## 2021-08-01 DIAGNOSIS — N1831 Chronic kidney disease, stage 3a: Secondary | ICD-10-CM

## 2021-08-01 DIAGNOSIS — M47812 Spondylosis without myelopathy or radiculopathy, cervical region: Secondary | ICD-10-CM | POA: Diagnosis not present

## 2021-08-01 DIAGNOSIS — R590 Localized enlarged lymph nodes: Secondary | ICD-10-CM | POA: Diagnosis not present

## 2021-08-01 DIAGNOSIS — H353220 Exudative age-related macular degeneration, left eye, stage unspecified: Secondary | ICD-10-CM | POA: Diagnosis present

## 2021-08-01 LAB — BASIC METABOLIC PANEL
Anion gap: 8 (ref 5–15)
BUN: 17 mg/dL (ref 8–23)
CO2: 29 mmol/L (ref 22–32)
Calcium: 9.3 mg/dL (ref 8.9–10.3)
Chloride: 104 mmol/L (ref 98–111)
Creatinine, Ser: 1.06 mg/dL — ABNORMAL HIGH (ref 0.44–1.00)
GFR, Estimated: 48 mL/min — ABNORMAL LOW (ref >60)
Glucose, Bld: 92 mg/dL (ref 70–99)
Potassium: 4.1 mmol/L (ref 3.5–5.1)
Sodium: 141 mmol/L (ref 135–145)

## 2021-08-01 LAB — CBC
HCT: 44 % (ref 36.0–46.0)
Hemoglobin: 14.8 g/dL (ref 12.0–15.0)
MCH: 32 pg (ref 26.0–34.0)
MCHC: 33.6 g/dL (ref 30.0–36.0)
MCV: 95 fL (ref 80.0–100.0)
Platelets: 197 10*3/uL (ref 150–400)
RBC: 4.63 MIL/uL (ref 3.87–5.11)
RDW: 14.3 % (ref 11.5–15.5)
WBC: 6.9 10*3/uL (ref 4.0–10.5)
nRBC: 0 % (ref 0.0–0.2)

## 2021-08-01 LAB — RESP PANEL BY RT-PCR (FLU A&B, COVID) ARPGX2
Influenza A by PCR: NEGATIVE
Influenza B by PCR: NEGATIVE
SARS Coronavirus 2 by RT PCR: NEGATIVE

## 2021-08-01 LAB — PROCALCITONIN: Procalcitonin: 0.11 ng/mL

## 2021-08-01 LAB — MRSA NEXT GEN BY PCR, NASAL: MRSA by PCR Next Gen: NOT DETECTED

## 2021-08-01 MED ORDER — ACETAMINOPHEN 325 MG PO TABS: 650.0000 mg | ORAL_TABLET | Freq: Four times a day (QID) | ORAL | Status: DC | PRN

## 2021-08-01 MED ORDER — MEMANTINE HCL 5 MG PO TABS
10.0000 mg | ORAL_TABLET | Freq: Two times a day (BID) | ORAL | Status: DC
  Administered 2021-08-01 – 2021-08-03 (×4): 10 mg via ORAL
  Filled 2021-08-01 (×4): qty 2

## 2021-08-01 MED ORDER — DONEPEZIL HCL 5 MG PO TABS
10.0000 mg | ORAL_TABLET | Freq: Every day | ORAL | Status: DC
  Administered 2021-08-01 – 2021-08-02 (×2): 10 mg via ORAL
  Filled 2021-08-01 (×2): qty 2

## 2021-08-01 MED ORDER — VANCOMYCIN HCL 1500 MG/300ML IV SOLN
1500.0000 mg | INTRAVENOUS | Status: DC
Start: 2021-08-03 — End: 2021-08-01

## 2021-08-01 MED ORDER — FLUTICASONE FUROATE-VILANTEROL 100-25 MCG/INH IN AEPB
1.0000 | INHALATION_SPRAY | Freq: Every day | RESPIRATORY_TRACT | Status: DC
  Administered 2021-08-02 – 2021-08-03 (×2): 1 via RESPIRATORY_TRACT
  Filled 2021-08-01: qty 28

## 2021-08-01 MED ORDER — SODIUM CHLORIDE 0.9 % IV SOLN: 2.0000 g | Freq: Two times a day (BID) | INTRAVENOUS | Status: DC

## 2021-08-01 MED ORDER — UMECLIDINIUM BROMIDE 62.5 MCG/INH IN AEPB
1.0000 | INHALATION_SPRAY | Freq: Every day | RESPIRATORY_TRACT | Status: DC
  Administered 2021-08-02 – 2021-08-03 (×2): 1 via RESPIRATORY_TRACT
  Filled 2021-08-01: qty 7

## 2021-08-01 MED ORDER — ONDANSETRON HCL 4 MG/2ML IJ SOLN: 4.0000 mg | Freq: Four times a day (QID) | INTRAMUSCULAR | Status: DC | PRN

## 2021-08-01 MED ORDER — INFLUENZA VAC A&B SA ADJ QUAD 0.5 ML IM PRSY
0.5000 mL | PREFILLED_SYRINGE | INTRAMUSCULAR | Status: AC
  Administered 2021-08-03: 0.5 mL via INTRAMUSCULAR
  Filled 2021-08-01 (×2): qty 0.5

## 2021-08-01 MED ORDER — IOHEXOL 350 MG/ML SOLN
75.0000 mL | Freq: Once | INTRAVENOUS | Status: AC | PRN
  Administered 2021-08-01: 75 mL via INTRAVENOUS

## 2021-08-01 MED ORDER — ASPIRIN EC 81 MG PO TBEC
81.0000 mg | DELAYED_RELEASE_TABLET | Freq: Every day | ORAL | Status: DC
  Administered 2021-08-02 – 2021-08-03 (×2): 81 mg via ORAL
  Filled 2021-08-01 (×2): qty 1

## 2021-08-01 MED ORDER — SODIUM CHLORIDE 0.9 % IV SOLN
3.0000 g | Freq: Once | INTRAVENOUS | Status: AC
  Administered 2021-08-01: 3 g via INTRAVENOUS
  Filled 2021-08-01: qty 8

## 2021-08-01 MED ORDER — ENOXAPARIN SODIUM 40 MG/0.4ML IJ SOSY
40.0000 mg | PREFILLED_SYRINGE | INTRAMUSCULAR | Status: DC
  Administered 2021-08-01 – 2021-08-02 (×2): 40 mg via SUBCUTANEOUS
  Filled 2021-08-01 (×2): qty 0.4

## 2021-08-01 MED ORDER — LOSARTAN POTASSIUM 25 MG PO TABS
25.0000 mg | ORAL_TABLET | Freq: Every day | ORAL | Status: DC
  Administered 2021-08-01 – 2021-08-03 (×3): 25 mg via ORAL
  Filled 2021-08-01 (×3): qty 1

## 2021-08-01 MED ORDER — FLUTICASONE-UMECLIDIN-VILANT 100-62.5-25 MCG/INH IN AEPB: 1.0000 | INHALATION_SPRAY | Freq: Every day | RESPIRATORY_TRACT | Status: DC

## 2021-08-01 MED ORDER — ONDANSETRON HCL 4 MG PO TABS: 4.0000 mg | ORAL_TABLET | Freq: Four times a day (QID) | ORAL | Status: DC | PRN

## 2021-08-01 MED ORDER — ACETAMINOPHEN 650 MG RE SUPP: 650.0000 mg | Freq: Four times a day (QID) | RECTAL | Status: DC | PRN

## 2021-08-01 MED ORDER — VALACYCLOVIR HCL 500 MG PO TABS
1000.0000 mg | ORAL_TABLET | Freq: Every day | ORAL | Status: DC
  Administered 2021-08-02: 08:00:00 1000 mg via ORAL
  Filled 2021-08-01: qty 2

## 2021-08-01 MED ORDER — MONTELUKAST SODIUM 10 MG PO TABS
10.0000 mg | ORAL_TABLET | Freq: Every day | ORAL | Status: DC
  Administered 2021-08-01 – 2021-08-02 (×2): 10 mg via ORAL
  Filled 2021-08-01 (×2): qty 1

## 2021-08-01 MED ORDER — SODIUM CHLORIDE 0.9 % IV SOLN
100.0000 mg | Freq: Two times a day (BID) | INTRAVENOUS | Status: DC
  Filled 2021-08-01 (×2): qty 100

## 2021-08-01 MED ORDER — HYDRALAZINE HCL 10 MG PO TABS
10.0000 mg | ORAL_TABLET | Freq: Four times a day (QID) | ORAL | Status: DC | PRN
Start: 2021-08-01 — End: 2021-08-03
  Filled 2021-08-01: qty 1

## 2021-08-01 MED ORDER — SODIUM CHLORIDE 0.9 % IV SOLN
100.0000 mg | Freq: Two times a day (BID) | INTRAVENOUS | Status: DC
  Administered 2021-08-02 – 2021-08-03 (×3): 100 mg via INTRAVENOUS
  Filled 2021-08-01 (×5): qty 100

## 2021-08-01 MED ORDER — VANCOMYCIN HCL 1500 MG/300ML IV SOLN
1500.0000 mg | Freq: Once | INTRAVENOUS | Status: AC
  Administered 2021-08-01: 1500 mg via INTRAVENOUS
  Filled 2021-08-01: qty 300

## 2021-08-01 NOTE — ED Triage Notes (Signed)
Pt comes with c/o facial swelling. Pt recently seen here for same and was given antibiotics on Friday. Pt has swelling noted to left side. Pt states redness and pain.  Family states it might have gotten worse and seems to be spreading down neck.

## 2021-08-01 NOTE — ED Provider Notes (Signed)
[facility] Emergency Department Provider Note ____________________________________________   Event Date/Time   First MD Initiated Contact with Patient 08/01/21 1030     (approximate)  I have reviewed the triage vital signs and the nursing notes.   HISTORY  Chief Complaint Facial Swelling  Level 5 caveat: History of present illness limited due to dementia  HPI Makayla [name] is a 87 y.o. female with PMH as noted below who presents with swelling and rash to the left side of her face which has been present for approximately the last week, persistent course, now associated with some spreading redness down towards her neck and up towards her temple.  Per the daughter, the patient was seen in the ED for this 3 days ago and was diagnosed with cellulitis.  She was started on Keflex.  Her symptoms seem to be improving but then today she started to have increased pain and the daughter found her crying.  This is when the daughter noticed the increased redness and swelling.  Past Medical History:  Diagnosis Date   Allergy    Alzheimer's dementia (HCC)    CT head 08/25/15 mild cortical atrophy and chronic small vessel ischemic change. atheroscloerosis. paranasal sinus disease    Anosmia    Asthma    Chicken pox    COPD (chronic obstructive pulmonary disease) (HCC)    Emphysema of lung (HCC)    per daughter never smoker exposed 2nd have via husband   Hard of hearing    since childhood   Hypertension    Nasal polyps    surgery x 15 years ago and in ~2013 as of 10/08/17   Syncope    03/2017 thought 2/2 diuretic stopped [name]    UTI (urinary tract infection)     Patient Active Problem List   Diagnosis Date Noted   Exudative age-related macular degeneration of left eye (HCC) 05/06/2021   Nonexudative age-related macular degeneration of right eye 05/06/2021   History of nasal polyp 01/13/2021   Sinusitis 01/13/2021   Allergic rhinitis 01/13/2021    Recurrent falls 09/21/2020   Fatigue 09/21/2020   Prediabetes 09/21/2020   Vitamin D deficiency 09/21/2020   Reduced vision 09/21/2020   Bilateral hearing loss 09/21/2020   Urinary incontinence 09/21/2020   Stage 3b chronic kidney disease (HCC) 09/21/2020   H/O valvular heart disease 09/21/2020   Arthritis, lumbar spine 02/25/2020   Chronic constipation 01/26/2020   Seborrheic keratoses 07/25/2019   Nail dystrophy 04/21/2019   Depression 04/17/2019   AK (actinic keratosis) 05/13/2018   History of prediabetes 01/07/2018   Abnormal gait 01/07/2018   CKD (chronic kidney disease) stage 3, GFR 30-59 ml/min (HCC) 11/25/2017   COPD (chronic obstructive pulmonary disease) (HCC) 11/25/2017   HLD (hyperlipidemia) 11/25/2017   Arthritis 11/25/2017   Carotid artery stenosis 11/25/2017   Diastolic dysfunction 11/25/2017   Aortic valve sclerosis 11/25/2017   Essential hypertension 10/08/2017   Dementia with behavioral disturbance (HCC) 10/08/2017   Mild intermittent asthma without complication 10/08/2017   Heart murmur 10/08/2017   Thrombophlebitis leg 07/19/2016    Past Surgical History:  Procedure Laterality Date   ABDOMINAL HYSTERECTOMY     ? year ? reason ; partial    BREAST BIOPSY     ? year    nasal polyps removed      Prior to Admission medications   Medication Sig Start Date End Date Taking? Authorizing Provider  aspirin EC 81 MG tablet Take 81 mg by mouth  daily.   Yes [provider]  Azelastine-Fluticasone 137-50 MCG/ACT SUSP PLACE 1 SPRAY INTO THE NOSE 2 (TWO) TIMES DAILY AS NEEDED. 01/14/21  Yes McLean-Scocuzza, Pasty Spillers, MD  cephALEXin (KEFLEX) 500 MG capsule Take 1 capsule (500 mg total) by mouth 3 (three) times daily. 07/29/21  Yes Minna Antis, MD  donepezil (ARICEPT) 10 MG tablet Take 1 tablet (10 mg total) by mouth at bedtime. 05/06/21  Yes McLean-Scocuzza, Pasty Spillers, MD  Fluticasone-Umeclidin-Vilant (TRELEGY ELLIPTA) 100-62.5-25 MCG/INH AEPB Inhale 1 puff  into the lungs daily. Rinse mouth after use 05/06/21  Yes McLean-Scocuzza, Pasty Spillers, MD  losartan (COZAAR) 25 MG tablet Take 1 tablet (25 mg total) by mouth daily. 05/06/21  Yes McLean-Scocuzza, Pasty Spillers, MD  memantine (NAMENDA) 10 MG tablet TAKE 1 TABLET BY MOUTH TWICE A DAY 03/18/21  Yes McLean-Scocuzza, Pasty Spillers, MD  montelukast (SINGULAIR) 10 MG tablet Take 10 mg by mouth daily. 08/01/21  Yes [provider]  sodium chloride (OCEAN) 0.65 % SOLN nasal spray Place 2 sprays into both nostrils as needed for congestion. Patient not taking: No sig reported 01/13/21   McLean-Scocuzza, Pasty Spillers, MD    Allergies Latex and Skin adhesives [cyanoacrylate]  Family History  Problem Relation Age of Onset   Cancer Mother        ? type   Other Mother        scarlett fever, skin ulcer, died of complications after MVA    Arthritis Mother    Alcohol abuse Paternal Uncle    Arthritis Father     Social History Social History   Tobacco Use   Smoking status: Former   Smokeless tobacco: Never  Substance Use Topics   Alcohol use: No   Drug use: No    Review of Systems Level 5 caveat: Unable to obtain review of systems due to dementia  ____________________________________________   PHYSICAL EXAM:  VITAL SIGNS: ED Triage Vitals  Enc Vitals Group     BP      Pulse      Resp      Temp      Temp src      SpO2      Weight      Height      Head Circumference      Peak Flow      Pain Score      Pain Loc      Pain Edu?      Excl. in GC?     Constitutional: Alert, confused.  Well appearing for age, in no acute distress. Eyes: Conjunctivae are normal.  EOMI. Head: Atraumatic. Nose: No congestion/rhinnorhea. Mouth/Throat: Mucous membranes are moist.   Neck: Normal range of motion.  Cardiovascular: Normal rate, regular rhythm. Good peripheral circulation. Respiratory: Normal respiratory effort.  No retractions.  Gastrointestinal: No distention.  Musculoskeletal: No lower extremity  edema.  Extremities warm and well perfused.  Neurologic:  Normal speech and language. No gross focal neurologic deficits are appreciated.  Skin:  Skin is warm and dry.  A few small excoriations to the left maxilla, closed and bleeding.  Faint surrounding erythema and swelling to the maxilla and upper lip.  Approximately 5 cm area of faint erythema and induration along the anterolateral left neck. Psychiatric: Calm and cooperative.  ____________________________________________   LABS (all labs ordered are listed, but only abnormal results are displayed)  Labs Reviewed  BASIC METABOLIC PANEL - Abnormal; Notable for the following components:      Result Value  Creatinine, Ser 1.06 (*)    GFR, Estimated 48 (*)    All other components within normal limits  RESP PANEL BY RT-PCR (FLU A&B, COVID) ARPGX2  CBC   ____________________________________________  EKG   ____________________________________________  RADIOLOGY  CT maxillofacial: IMPRESSION:  1. Findings compatible with cellulitis of the left face, extending  into left upper neck as detailed above. No discrete, drainable fluid  collection.  2. Moderate to severe paranasal sinus mucosal thickening, greatest  in the right frontal sinus, ethmoid air cells and inferior left  maxillary sinus as detailed above. Left inferior maxillary sinus  mucosal thickening may be odontogenic given periapical lucency of  the left second maxillary molar.  3. Borderline enlarged left submandibular lymph nodes, nonspecific  but likely reactive given above findings.   CT neck:  IMPRESSION:  1. Edema of the subcutaneous soft tissues of the left face extending  into left upper neck, nonspecific but compatible with reported  cellulitis. No discrete, drainable fluid collection identified.  2. See concurrent CT face for characterization of the sinuses.  3. Asymmetric and borderline enlarged left submandibular lymph  nodes, nonspecific but likely  reactive given the above findings.    ____________________________________________   PROCEDURES  Procedure(s) performed: No  Procedures  Critical Care performed: No ____________________________________________   INITIAL IMPRESSION / ASSESSMENT AND PLAN / ED COURSE  Pertinent labs & imaging results that were available during my care of the patient were reviewed by me and considered in my medical decision making (see chart for details).   87 year old female with PMH as noted above including Alzheimer's dementia presents with increased pain and redness to the left side of her face after being recently diagnosed with cellulitis.  I reviewed the past medical records in Epic; I confirmed that the patient was seen in the ED on 9/23 for small area of excoriation and erythema to the left side of her face and was treated empirically with Keflex for presumed cellulitis.  On exam currently, the patient is well-appearing for her age.  Her vital signs are normal.  Physical exam reveals a few superficial excoriated appearing wounds to the left side of her face which are closed and healing with no bleeding or drainage.  There is faint erythema and swelling to the maxilla and the upper lip.  The airway is clear.  There is also a small area of faint erythema running from the left side of the face down to the anterior lateral left neck.  Differential includes continued facial cellulitis versus possible shingles given that the pain is somewhat out of proportion to the exam findings.  ----------------------------------------- 2:16 PM on 08/01/2021 -----------------------------------------  Lab work-up is reassuring.  CT confirms findings consistent with cellulitis, with no drainable abscess.  I ordered IV Unasyn to cover for cellulitis that could be odontogenic in origin although this also may just be from superinfection of the excoriated lesions on the face.  I consulted Dr. Sedalia Muta from the hospitalist  service for admission.  ____________________________________________   FINAL CLINICAL IMPRESSION(S) / ED DIAGNOSES  Final diagnoses:  Facial cellulitis      NEW MEDICATIONS STARTED DURING THIS VISIT:  New Prescriptions   No medications on file     Note:  This document was prepared using Dragon voice recognition software and may include unintentional dictation errors.    Dionne Bucy, MD 08/01/21 [phone]

## 2021-08-01 NOTE — ED Notes (Signed)
Pt resting in bed. Iv placed for CT scan. Warm blanket provided. Daughter at bedside. Call bell in reach. No other needs requested at this time.

## 2021-08-01 NOTE — Telephone Encounter (Signed)
Agree with need for repeat ED evaluation.

## 2021-08-01 NOTE — ED Notes (Signed)
Patient transported to CT 

## 2021-08-01 NOTE — Consult Note (Signed)
Pharmacy Antibiotic Note  Rukiya Hodgkins is a 87 y.o. female admitted on 08/01/2021 with facial swelling/ cellulitis that failed outpatient cephalexin. Pharmacy has been consulted for vancomycin and cefepime dosing.  Plan: Cefepime 2 gram Q12H Vancomycin 1500 mg x 1 given in ED.  Initiate Vancomycin 1500 mg Q48H. Goal AUC 400-500 Estimated AUC 496/Cmin: 9.9 Scr 1.06, IBW, Vd 0.72 Monitor renal function for safety and possible dose adjustments      Temp (24hrs), Avg:97.8 F (36.6 C), Min:97.8 F (36.6 C), Max:97.8 F (36.6 C)  Recent Labs  Lab 08/01/21 0817  WBC 6.9  CREATININE 1.06*    Estimated Creatinine Clearance: 30.2 mL/min (A) (by C-G formula based on SCr of 1.06 mg/dL (H)).    Allergies  Allergen Reactions   Latex     rash   Skin Adhesives [Cyanoacrylate]     Antimicrobials this admission: 9/26 unasyn >> x 1 9/26 vancomycin >>  9/26 cefepime >>  Dose adjustments this admission:   Microbiology results: 9/26 MRSA PCR: sent   Thank you for allowing pharmacy to be a part of this patient's care.  Sharen Hones, PharmD, BCPS Clinical Pharmacist   08/01/2021 3:12 PM

## 2021-08-01 NOTE — Consult Note (Signed)
NAME: Makayla [name]  DOB: 31-Dec-1922  MRN: [id number]  Date/Time: 08/01/2021 4:55 PM  REQUESTING PROVIDER: Dr.Cox Subjective:  REASON FOR CONSULT: r/o cutaneous anthrax ?Pt is a poor historian, chart revived and spoke to daughter at bed side Makayla [name] is a 87 y.o. female with a history of Dementia, COPD, HTN presnets with pain ful erythematous swelling left side of face for the past 4 days Daughter says patient is a picker and will pick on  any spots on her skin On Thurday night patient was fine , at her baseline when  going to bed. Friday morning when she woke up daughter noted a red area on her face and 2 small lesion son her nasolabial fold on the left. She was c/o pain left side of the face    She called her PCP  and was asked to go to the ED. She  Was seen in ED on 07/29/21 , with normal vitals, and normal WBC- she was diagnosed with cellulitis face and sent on keflex The daughter noted over the next 2 days the face had some more lesions , and patient was c/o pain and she was asked to go to the ED again PT has no fever, she has been rubbing her face a lot as it is itchy and also painful She is basically at home , has 10 hr home care attendants every day Mon-Frid- Daughter looks after her in the evening. Daughter has a Armed forces training and education officer business. 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 Her attendants do not work in the farm. Her daughter or others at home do not have any skin lesions Patient has not used any new soaps, detergents, new pillow covers, no  covers, throws, rugs made of  animal hyde.   Past Medical History:  Diagnosis Date   Allergy    Alzheimer's dementia (HCC)    CT head 08/25/15 mild cortical atrophy and chronic small vessel ischemic change. atheroscloerosis. paranasal sinus disease    Anosmia    Asthma    Chicken pox    COPD (chronic  obstructive pulmonary disease) (HCC)    Emphysema of lung (HCC)    per daughter never smoker exposed 2nd have via husband   Hard of hearing    since childhood   Hypertension    Nasal polyps    surgery x 15 years ago and in ~2013 as of 10/08/17   Syncope    03/2017 thought 2/2 diuretic stopped in [facility]    UTI (urinary tract infection)     Past Surgical History:  Procedure Laterality Date   ABDOMINAL HYSTERECTOMY     ? year ? reason ; partial    BREAST BIOPSY     ? year    nasal polyps removed      Social History   Socioeconomic History   Marital status: Widowed    Spouse name: Not on file   Number of children: Not on file   Years of education: Not on file   Highest education level: Not on file  Occupational History   Not on file  Tobacco Use   Smoking status: Former   Smokeless tobacco: Never  Substance and Sexual Activity   Alcohol use: No   Drug use: No   Sexual activity: Never  Other Topics Concern   Not on file  Social  History Narrative   Lives with daughter Makayla Dandy since 25 or 08/2017 just moved from [facility] (also in home grandson and son in Social worker)   Widowed   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 type   Other Mother        scarlett fever, skin ulcer, died of complications after MVA    Arthritis Mother    Alcohol abuse Paternal Uncle    Arthritis Father    Allergies  Allergen Reactions   Latex     rash   Skin Adhesives [Cyanoacrylate]    I? Current Facility-Administered Medications  Medication Dose Route Frequency Provider Last Rate Last Admin    acetaminophen (TYLENOL) tablet 650 mg  650 mg Oral Q6H PRN Cox, Amy N, DO       Or   acetaminophen (TYLENOL) suppository 650 mg  650 mg Rectal Q6H PRN Cox, Amy N, DO       [START ON 08/02/2021] aspirin EC tablet 81 mg  81 mg Oral Daily Cox, Amy N, DO       donepezil (ARICEPT) tablet 10 mg  10 mg Oral QHS Cox, Amy N, DO       doxycycline (VIBRAMYCIN) 100 mg in sodium chloride 0.9 % 250 mL IVPB  100 mg Intravenous Q12H Cox, Amy N, DO       enoxaparin (LOVENOX) injection 40 mg  40 mg Subcutaneous Q24H Cox, Amy N, DO       Fluticasone-Umeclidin-Vilant 100-62.5-25 MCG/INH AEPB 1 puff  1 puff Inhalation Daily Cox, Amy N, DO       hydrALAZINE (APRESOLINE) tablet 10 mg  10 mg Oral Q6H PRN Cox, Amy N, DO       losartan (COZAAR) tablet 25 mg  25 mg Oral Daily Cox, Amy N, DO   25 mg at 08/01/21 1447   memantine (NAMENDA) tablet 10 mg  10 mg Oral BID Cox, Amy N, DO       montelukast (SINGULAIR) tablet 10 mg  10 mg Oral Daily Cox, Amy N, DO       ondansetron (ZOFRAN) tablet 4 mg  4 mg Oral Q6H PRN Cox, Amy N, DO       Or   ondansetron (ZOFRAN) injection 4 mg  4 mg Intravenous Q6H PRN Cox, Amy N, DO       valACYclovir (VALTREX) tablet 1,000 mg  1,000 mg Oral Daily Brittani Purdum, MD       vancomycin (VANCOREADY) IVPB 1500 mg/300 mL  1,500 mg Intravenous Once Sharen Hones, RPH 150 mL/hr at 08/01/21 1536 1,500 mg at 08/01/21 1536   [START ON 08/03/2021] vancomycin (VANCOREADY) IVPB 1500 mg/300 mL  1,500 mg Intravenous Q48H Sharen Hones, RPH       Current Outpatient Medications  Medication Sig Dispense Refill   aspirin EC 81 MG tablet Take 81 mg by mouth daily.     Azelastine-Fluticasone 137-50 MCG/ACT SUSP PLACE 1 SPRAY INTO THE NOSE 2 (TWO) TIMES  DAILY AS NEEDED. 23 g 11   cephALEXin (KEFLEX) 500 MG capsule Take 1 capsule (500 mg total) by mouth 3 (three) times daily. 21 capsule 0   donepezil (ARICEPT) 10 MG tablet Take 1 tablet (10 mg total) by mouth at bedtime. 90 tablet 3    Fluticasone-Umeclidin-Vilant (TRELEGY ELLIPTA) 100-62.5-25 MCG/INH AEPB Inhale 1 puff into the lungs daily. Rinse mouth after use 60 each 11   losartan (COZAAR) 25 MG tablet Take 1 tablet (25 mg total) by mouth daily. 90 tablet 3   memantine (NAMENDA) 10 MG tablet TAKE 1 TABLET BY MOUTH TWICE A DAY 180 tablet 3   montelukast (SINGULAIR) 10 MG tablet Take 10 mg by mouth daily.     sodium chloride (OCEAN) 0.65 % SOLN nasal spray Place 2 sprays into both nostrils as needed for congestion. (Patient not taking: No sig reported) 30 mL 11     Abtx:  Anti-infectives (From admission, onward)    Start     Dose/Rate Route Frequency Ordered Stop   08/03/21 1500  vancomycin (VANCOREADY) IVPB 1500 mg/300 mL        1,500 mg 150 mL/hr over 120 Minutes Intravenous Every 48 hours 08/01/21 1511 08/09/21 1459   08/01/21 1700  ceFEPIme (MAXIPIME) 2 g in sodium chloride 0.9 % 100 mL IVPB  Status:  Discontinued        2 g 200 mL/hr over 30 Minutes Intravenous Every 12 hours 08/01/21 1422 08/01/21 1619   08/01/21 1700  valACYclovir (VALTREX) tablet 1,000 mg        1,000 mg Oral Daily 08/01/21 1654     08/01/21 1600  vancomycin (VANCOREADY) IVPB 1500 mg/300 mL        1,500 mg 150 mL/hr over 120 Minutes Intravenous  Once 08/01/21 1422     08/01/21 1530  doxycycline (VIBRAMYCIN) 100 mg in sodium chloride 0.9 % 250 mL IVPB        100 mg 125 mL/hr over 120 Minutes Intravenous Every 12 hours 08/01/21 1519     08/01/21 1415  Ampicillin-Sulbactam (UNASYN) 3 g in sodium chloride 0.9 % 100 mL IVPB        3 g 200 mL/hr over 30 Minutes Intravenous  Once 08/01/21 1402 08/01/21 1448       REVIEW OF SYSTEMS: pt not a reliable historian- daughter supplements information Const: negative fever, negative chills, negative weight loss Eyes: negative diplopia or visual changes, negative eye pain ENT: negative coryza, negative sore throat Resp: baseline  cough, some dyspnea Cards: negative for chest pain, palpitations, lower  extremity edema GU: negative for frequency, dysuria and hematuria GI: Negative for abdominal pain, diarrhea, bleeding, constipation Skin: negative for rash and pruritus Heme: negative for easy bruising and gum/nose bleeding MS: weakness of old age Neurolo:negative for headaches, dizziness, vertigo, has memory problems  Psych: negative for feelings of anxiety, depression  Endocrine: negative for thyroid, diabetes Allergy/Immunology- negative for any medication or food allergies ?  Objective:  VITALS:  BP (!) 178/96   Pulse 62   Temp 97.8 F (36.6 C) (Oral)   Resp 16   SpO2 95%  PHYSICAL EXAM:  General: Alert, cooperative, no distress, appears stated age. Oriented in person, repsonds to questions appropriately, hard of hearing Head: Normocephalic, without obvious abnormality, atraumatic. Eyes: Conjunctivae clear, anicteric sclerae. Pupils are pinpoint /equal ENT Nares normal. No drainage or sinus tenderness. Lips, mucosa, and tongue normal. No Thrush Face left side- a few lesions, polymorphic, couple have eschar, two are vesilces, some  look like excoriation    Some erythema around  not much of edema A few excoriations pin pont on the upper lip area Neck: Supple, symmetrical, no adenopathy, thyroid: non tender no carotid bruit and no JVD. Back: No CVA tenderness. Lungs:sibilant rhonchi Heart: Regular rate and rhythm, no murmur, rub or gallop. Abdomen: Soft, non-tender,not distended. Bowel sounds normal. No masses Extremities: atraumatic, no cyanosis. No edema. No clubbing Skin: as above Lymph: Cervical, supraclavicular normal. Neurologic: Grossly non-focal Pertinent Labs Lab Results CBC    Component Value Date/Time   WBC 6.9 08/01/2021 0817   RBC 4.63 08/01/2021 0817   HGB 14.8 08/01/2021 0817   HCT 44.0 08/01/2021 0817   PLT 197 08/01/2021 0817   MCV 95.0 08/01/2021 0817   MCH 32.0 08/01/2021 0817   MCHC 33.6 08/01/2021 0817   RDW 14.3 08/01/2021 0817   LYMPHSABS  2.0 01/13/2021 1605   MONOABS 0.6 01/13/2021 1605   EOSABS 0.8 (H) 01/13/2021 1605   BASOSABS 0.1 01/13/2021 1605    CMP Latest Ref Rng & Units 08/01/2021 01/13/2021 09/15/2020  Glucose 70 - 99 mg/dL 92 78 91  BUN 8 - 23 mg/dL 17 47(M) 17  Creatinine 0.44 - 1.00 mg/dL 5.46(T) 0.35 4.65(K)  Sodium 135 - 145 mmol/L 141 141 143  Potassium 3.5 - 5.1 mmol/L 4.1 4.8 4.6  Chloride 98 - 111 mmol/L 104 104 105  CO2 22 - 32 mmol/L 29 30 30   Calcium 8.9 - 10.3 mg/dL 9.3 9.5 9.1  Total Protein 6.0 - 8.3 g/dL - 6.6 6.1  Total Bilirubin 0.2 - 1.2 mg/dL - 0.5 0.4  Alkaline Phos 39 - 117 U/L - 73 79  AST 0 - 37 U/L - 15 17  ALT 0 - 35 U/L - 13 16      Microbiology: Recent Results (from the past 240 hour(s))  Resp Panel by RT-PCR (Flu A&B, Covid) Nasopharyngeal Swab     Status: None   Collection Time: 08/01/21  2:18 PM   Specimen: Nasopharyngeal Swab; Nasopharyngeal(NP) swabs in vial transport medium  Result Value Ref Range Status   SARS Coronavirus 2 by RT PCR NEGATIVE NEGATIVE Final    Comment: (NOTE) SARS-CoV-2 target nucleic acids are NOT DETECTED.  The SARS-CoV-2 RNA is generally detectable in upper respiratory specimens during the acute phase of infection. The lowest concentration of SARS-CoV-2 viral copies this assay can detect is 138 copies/mL. A negative result does not preclude SARS-Cov-2 infection and should not be used as the sole basis for treatment or other patient management decisions. A negative result may occur with  improper specimen collection/handling, submission of specimen other than nasopharyngeal swab, presence of viral mutation(s) within the areas targeted by this assay, and inadequate number of viral copies(<138 copies/mL). A negative result must be combined with clinical observations, patient history, and epidemiological information. The expected result is Negative.  Fact Sheet for Patients:  BloggerCourse.com  Fact Sheet for  Healthcare Providers:  SeriousBroker.it  This test is no t yet approved or cleared by the [place] FDA and  has been authorized for detection and/or diagnosis of SARS-CoV-2 by FDA under an Emergency Use Authorization (EUA). This EUA will remain  in effect (meaning this test can be used) for the duration of the COVID-19 declaration under Section 564(b)(1) of the Act, 21 U.S.C.section 360bbb-3(b)(1), unless the authorization is terminated  or revoked sooner.       Influenza A by PCR NEGATIVE NEGATIVE Final   Influenza B by PCR NEGATIVE NEGATIVE Final  Comment: (NOTE) The Xpert Xpress SARS-CoV-2/FLU/RSV plus assay is intended as an aid in the diagnosis of influenza from Nasopharyngeal swab specimens and should not be used as a sole basis for treatment. Nasal washings and aspirates are unacceptable for Xpert Xpress SARS-CoV-2/FLU/RSV testing.  Fact Sheet for Patients: BloggerCourse.com  Fact Sheet for Healthcare Providers: SeriousBroker.it  This test is not yet approved or cleared by the [place] FDA and has been authorized for detection and/or diagnosis of SARS-CoV-2 by FDA under an Emergency Use Authorization (EUA). This EUA will remain in effect (meaning this test can be used) for the duration of the COVID-19 declaration under Section 564(b)(1) of the Act, 21 U.S.C. section 360bbb-3(b)(1), unless the authorization is terminated or revoked.  Performed at [facility], [address] [place], [place] [address]     IMAGING RESULTS: CT scan neck/face Edema of the subcutaneous soft tissues of the left face extending into left upper neck, nonspecific but compatible with reported cellulitis. No discrete, drainable fluid collection identified. I have personally reviewed the films ? Impression/Recommendation ?few scattered polymorphic lesions on the left side of her face with  surrounding mild erythema Some of the lesions have eschar No severe edema D.D Staph infection Shingles Excoriations  and secondary infection from picking on her skin No fever or leucocytosis  This does not look like anthrax- On vanco/cefepime/doxy DC cefepime Vanco dose is q 48 Add valtrex 1 grm Qd ( adjusted to crcl) Check HSV/VZV PCR Depending on how her face looks tomorrow may add linezolid instead of vanco Watch closely for renal function as she got vanco and also contrast CT Pt is not septic  COPD on inhalers HTN on losartan ? ? ___________________________________________________ Discussed with daughter and requesting provider Thank you for the consult  Note:  This document was prepared using Dragon voice recognition software and may include unintentional dictation errors.

## 2021-08-01 NOTE — H&P (Addendum)
History and Physical   Makayla [name] ZOX:[id number] DOB: [birth year] DOA: 08/01/2021  PCP: McLean-Scocuzza, Pasty Spillers, MD  Outpatient Specialists: Dr. Allena Katz, DPM Patient coming from: home  I have personally briefly reviewed patient's old medical records in [facility] EMR.  Chief Concern: Facial swelling  HPI: Makayla [name] is a 87 y.o. female with medical history significant for hypertension, dementia, mild COPD, who presents to the emergency department from home for chief concerns of facial swelling/redness.  The redness and swelling started on Thursday, 07/28/2021.  Family denies fever, chills, cough, vomiting, diarrhea.  Patient is generally compliant with medication, though patient is not consistent with inhaler.  At bedside patient is able to tell me her name.  She is not able to tell me her age, she does not know her birthday, current location, the current calendar year.  She knows that her daughter is at bedside.  Per family at bedside, this is her baseline mental status at this time.  Social history: She lives at home with her daughter, Brayton Layman. They are exposed to farms. Patient is not a tobacco user, etoh, recreational drug use.   Vaccination history: She is vaccinated and booster   ROS: Unable to complete as patient has moderate to severe dementia  ED Course: Discussed with emergency medicine provider, patient requiring hospitalization for chief concerns of cellulitis failing outpatient therapy.  Vitals in the emergency department was remarkable for temperature of 97.8, respiration rate of 16, heart rate of 61, initial blood pressure 151/75, increased to 178/96, SPO2 of 95% on room air.  Labs in the emergency department was remarkable for sodium 141, potassium 4.1, chloride 104, bicarb 29, BUN of 17, serum creatinine 1.06, nonfasting blood glucose 92, GFR 48, WBC 6.9, hemoglobin 14.8, platelets 197.  Unasyn 3 g IV was given once in the emergency  department.  Assessment/Plan  Principal Problem:   Cellulitis Active Problems:   Essential hypertension   Mild intermittent asthma without complication   CKD (chronic kidney disease) stage 3, GFR 30-59 ml/min (HCC)   COPD (chronic obstructive pulmonary disease) (HCC)   HLD (hyperlipidemia)   Carotid artery stenosis   AK (actinic keratosis)   Depression   Seborrheic keratoses   Recurrent falls   Fatigue   Bilateral hearing loss   H/O valvular heart disease   Exudative age-related macular degeneration of left eye (HCC)   # Left facial cellulitis-failed outpatient therapy - Differentials include cutaneous bacillus anthracis given that [place] farm [place] live [place]. 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 She lives with her daughter and son-in-law who has direct contact with the animals.  - Patient was given 1 dose of Unasyn in the emergency department by EDP - MRSA PCR check - Reviewed of the broad-spectrum antibiotic coverage, we will initiate with broad-spectrum antibiotics covering considering that patient failed outpatient therapy with first generation cephalosporin - Cefepime, vancomycin per pharmacy - If PCR returns as negative, will discontinue vancomycin - Portable chest x-ray ordered, check procalcitonin - Blood cultures x2 ordered - ID was consulted and recommended HSV PCR  # Hypertension-resumed losartan 25 mg daily; hydralazine 10 mg po q6h prn for SBP > 180, 4 doses ordered  # Dementia-resume donepezil 10 mg nightly, memantine 10 mg p.o. twice daily - I recommend that family, daughter be allowed to stay with patient overnight to avoid sun-downing/delirium  Chart reviewed.   07/29/2021: ED visit, patient was discharged from the emergency  department with cephalexin 500 mg, 3 times daily  DVT prophylaxis: Enoxaparin 40 mg subcutaneous every 24 hours Code Status: DNR/DNI  Diet: Heart healthy Family  Communication: Updated daughter at bedside  Disposition Plan: Pending clinical course Consults called: None at this time Admission status: MedSurg, observation, no telemetry  Past Medical History:  Diagnosis Date   Allergy    Alzheimer's dementia (HCC)    CT head 08/25/15 mild cortical atrophy and chronic small vessel ischemic change. atheroscloerosis. paranasal sinus disease    Anosmia    Asthma    Chicken pox    COPD (chronic obstructive pulmonary disease) (HCC)    Emphysema of lung (HCC)    per daughter never smoker exposed 2nd have via husband   Hard of hearing    since childhood   Hypertension    Nasal polyps    surgery x 15 years ago and in ~2013 as of 10/08/17   Syncope    03/2017 thought 2/2 diuretic stopped in [facility]    UTI (urinary tract infection)    Past Surgical History:  Procedure Laterality Date   ABDOMINAL HYSTERECTOMY     ? year ? reason ; partial    BREAST BIOPSY     ? year    nasal polyps removed     Social History:  reports that she has quit smoking. She has never used smokeless tobacco. She reports that she does not drink alcohol and does not use drugs.  Allergies  Allergen Reactions   Latex     rash   Skin Adhesives [Cyanoacrylate]    Family History  Problem Relation Age of Onset   Cancer Mother        ? type   Other Mother        scarlett fever, skin ulcer, died of complications after MVA    Arthritis Mother    Alcohol abuse Paternal Uncle    Arthritis Father    Family history: Family history reviewed and not pertinent  Prior to Admission medications   Medication Sig Start Date End Date Taking? Authorizing Provider  aspirin EC 81 MG tablet Take 81 mg by mouth daily.   Yes [provider]  Azelastine-Fluticasone 137-50 MCG/ACT SUSP PLACE 1 SPRAY INTO THE NOSE 2 (TWO) TIMES DAILY AS NEEDED. 01/14/21  Yes McLean-Scocuzza, Pasty Spillers, MD  cephALEXin (KEFLEX) 500 MG capsule Take 1 capsule (500 mg total) by mouth 3 (three) times daily.  07/29/21  Yes Minna Antis, MD  donepezil (ARICEPT) 10 MG tablet Take 1 tablet (10 mg total) by mouth at bedtime. 05/06/21  Yes McLean-Scocuzza, Pasty Spillers, MD  Fluticasone-Umeclidin-Vilant (TRELEGY ELLIPTA) 100-62.5-25 MCG/INH AEPB Inhale 1 puff into the lungs daily. Rinse mouth after use 05/06/21  Yes McLean-Scocuzza, Pasty Spillers, MD  losartan (COZAAR) 25 MG tablet Take 1 tablet (25 mg total) by mouth daily. 05/06/21  Yes McLean-Scocuzza, Pasty Spillers, MD  memantine (NAMENDA) 10 MG tablet TAKE 1 TABLET BY MOUTH TWICE A DAY 03/18/21  Yes McLean-Scocuzza, Pasty Spillers, MD  montelukast (SINGULAIR) 10 MG tablet Take 10 mg by mouth daily. 08/01/21  Yes [provider]  sodium chloride (OCEAN) 0.65 % SOLN nasal spray Place 2 sprays into both nostrils as needed for congestion. Patient not taking: No sig reported 01/13/21   McLean-Scocuzza, Pasty Spillers, MD   Physical Exam: Vitals:   08/01/21 1230 08/01/21 1330 08/01/21 1400 08/01/21 1415  BP: (!) 120/59 (!) 162/103 (!) 178/96   Pulse: 70 71 64 62  Resp: 16  Temp:      TempSrc:      SpO2: 91% 95% 90% 95%   Constitutional: appears age-appropriate, NAD, calm, comfortable Eyes: PERRL, lids and conjunctivae normal ENMT: Mucous membranes are moist. Posterior pharynx clear of any exudate or lesions. Age-appropriate dentition. Hearing loss Neck: normal, supple, no masses, no thyromegaly Respiratory: clear to auscultation bilaterally, no wheezing, no crackles. Normal respiratory effort. No accessory muscle use.  Cardiovascular: Regular rate and rhythm, no murmurs / rubs / gallops. No extremity edema. 2+ pedal pulses. No carotid bruits.  Abdomen: no tenderness, no masses palpated, no hepatosplenomegaly. Bowel sounds positive.  Musculoskeletal: no clubbing / cyanosis. No joint deformity upper and lower extremities. Good ROM, no contractures, no atrophy. Normal muscle tone.  Skin: + rashes, lesions. No induration   The image from daughter phone is from Friday AM.        Neurologic: Sensation intact. Strength 5/5 in all 4.  Psychiatric: Normal judgment and insight. Alert and oriented x 3. Normal mood.   EKG: Not indicated  Chest x-ray on Admission: I personally reviewed and I agree with radiologist reading as below.  CT Soft Tissue Neck W Contrast  Result Date: 08/01/2021 CLINICAL DATA:  Cellulitis, neck EXAM: CT NECK WITH CONTRAST TECHNIQUE: Multidetector CT imaging of the neck was performed using the standard protocol following the bolus administration of intravenous contrast. CONTRAST:  36mL OMNIPAQUE IOHEXOL 350 MG/ML SOLN COMPARISON:  None. FINDINGS: Pharynx and larynx: Streak artifact from dental amalgam limits evaluation of the oropharynx without visible mass or edema. Significantly motion limited evaluation of the larynx, essentially nondiagnostic. Salivary glands: There is edema/stranding in the left submandibular space; however, the submandibular glands are similar in size and the left submandibular gland does not appear edematous. Mild intraglandular ductal dilation is symmetric. No calculi identified. The parotid glands are symmetric and within normal limits. Thyroid: Normal. Lymph nodes: Asymmetric and borderline enlarged left submandibular lymph nodes, without evidence of suppurative change. Vascular: Atherosclerosis. Limited evaluation due to non arterial timing and motion with the major arteries in the neck appearing grossly patent. Limited intracranial: Negative. Visualized orbits: Negative. Mastoids and visualized paranasal sinuses: Evaluated on concurrent CT face. Skeleton: Moderate multilevel degenerative change of the cervical spine. Upper chest: Motion limited evaluation. No consolidation. Mosaic attenuation, likely related to expiratory imaging. Calcified granuloma in the right upper lobe. Other: Soft tissue stranding/edema of the subcutaneous soft tissues of the left face, extending into the left upper neck and involving the submandibular  space. Associated mild thickening of the left platysma. No discrete drainable fluid collection identified. IMPRESSION: 1. Edema of the subcutaneous soft tissues of the left face extending into left upper neck, nonspecific but compatible with reported cellulitis. No discrete, drainable fluid collection identified. 2. See concurrent CT face for characterization of the sinuses. 3. Asymmetric and borderline enlarged left submandibular lymph nodes, nonspecific but likely reactive given the above findings. Electronically Signed   By: Feliberto Harts M.D.   On: 08/01/2021 13:06   CT Maxillofacial W Contrast  Result Date: 08/01/2021 CLINICAL DATA:  Cellulitis, face EXAM: CT MAXILLOFACIAL WITH CONTRAST TECHNIQUE: Multidetector CT imaging of the maxillofacial structures was performed with intravenous contrast. Multiplanar CT image reconstructions were also generated. CONTRAST:  68mL OMNIPAQUE IOHEXOL 350 MG/ML SOLN COMPARISON:  None. FINDINGS: Osseous: No fracture or mandibular dislocation. No destructive process. Maxillary torus. Orbits: Negative. No traumatic or inflammatory finding. Sinuses: Complete opacification of the right frontal sinus with opacification of multiple ethmoid air cells bilaterally. Moderate left and mild  right maxillary sinus mucosal thickening. Mild mucosal thickening left sphenoid sinus with frothy secretions. Left inferior maxillary sinus mucosal thickening may be odontogenic given periapical lucency of the left second maxillary molar. Soft tissues: Swelling/soft tissue stranding involving the left cheek, extending inferiorly to involve the upper neck and left submandibular space. Mild associated thickening of the left platysma. The submandibular glands and parotid glands are symmetric and within normal limits. Multiple borderline enlarged left submandibular lymph nodes without suppurative change. Limited intracranial: No significant or unexpected finding. IMPRESSION: 1. Findings compatible  with cellulitis of the left face, extending into left upper neck as detailed above. No discrete, drainable fluid collection. 2. Moderate to severe paranasal sinus mucosal thickening, greatest in the right frontal sinus, ethmoid air cells and inferior left maxillary sinus as detailed above. Left inferior maxillary sinus mucosal thickening may be odontogenic given periapical lucency of the left second maxillary molar. 3. Borderline enlarged left submandibular lymph nodes, nonspecific but likely reactive given above findings. Electronically Signed   By: Feliberto Harts M.D.   On: 08/01/2021 12:59    Labs on Admission: I have personally reviewed following labs  CBC: Recent Labs  Lab 08/01/21 0817  WBC 6.9  HGB 14.8  HCT 44.0  MCV 95.0  PLT 197   Basic Metabolic Panel: Recent Labs  Lab 08/01/21 0817  NA 141  K 4.1  CL 104  CO2 29  GLUCOSE 92  BUN 17  CREATININE 1.06*  CALCIUM 9.3   GFR: Estimated Creatinine Clearance: 30.2 mL/min (A) (by C-G formula based on SCr of 1.06 mg/dL (H)).  Urine analysis:    Component Value Date/Time   COLORURINE YELLOW 04/14/2021 0828   APPEARANCEUR CLOUDY (A) 04/14/2021 0828   APPEARANCEUR Clear 03/04/2019 1418   LABSPEC 1.023 04/14/2021 0828   PHURINE 5.5 04/14/2021 0828   GLUCOSEU NEGATIVE 04/14/2021 0828   GLUCOSEU NEGATIVE 10/08/2017 1401   HGBUR NEGATIVE 04/14/2021 0828   BILIRUBINUR NEGATIVE 01/08/2020 0040   BILIRUBINUR Negative 03/04/2019 1418   KETONESUR NEGATIVE 04/14/2021 0828   PROTEINUR NEGATIVE 04/14/2021 0828   UROBILINOGEN 0.2 10/08/2017 1401   NITRITE NEGATIVE 04/14/2021 0828   LEUKOCYTESUR 2+ (A) 04/14/2021 0828   Dr. Sedalia Muta [facility]  If 7PM-7AM, please contact overnight-coverage provider If 7AM-7PM, please contact day coverage provider [email or web address]  08/01/2021, 2:20 PM

## 2021-08-01 NOTE — Telephone Encounter (Signed)
Called to speak wit Earnesteen and connected with her Daughter [name]. [name] states that they were given the Keflex and were taking it over the weekend. [name] still has redness over her left cheek and [name] states that Chalet has been scratching at it. Pt is back at the ED this morning starting at 7:47am due to facial pain. Pt states that her skin hurts and was insisting to be seen. [name] states that her skin is still red and along the wrinkles of her cheeks, the skin has turned black. The redness has stretched from her cheek down to her neck. They are awaiting on evaluation from the doctor at this time.

## 2021-08-02 DIAGNOSIS — Z90710 Acquired absence of both cervix and uterus: Secondary | ICD-10-CM | POA: Diagnosis not present

## 2021-08-02 DIAGNOSIS — F32A Depression, unspecified: Secondary | ICD-10-CM | POA: Diagnosis present

## 2021-08-02 DIAGNOSIS — L03211 Cellulitis of face: Secondary | ICD-10-CM | POA: Diagnosis present

## 2021-08-02 DIAGNOSIS — B958 Unspecified staphylococcus as the cause of diseases classified elsewhere: Secondary | ICD-10-CM | POA: Diagnosis present

## 2021-08-02 DIAGNOSIS — H353110 Nonexudative age-related macular degeneration, right eye, stage unspecified: Secondary | ICD-10-CM | POA: Diagnosis present

## 2021-08-02 DIAGNOSIS — Z7982 Long term (current) use of aspirin: Secondary | ICD-10-CM | POA: Diagnosis not present

## 2021-08-02 DIAGNOSIS — L821 Other seborrheic keratosis: Secondary | ICD-10-CM | POA: Diagnosis present

## 2021-08-02 DIAGNOSIS — J4520 Mild intermittent asthma, uncomplicated: Secondary | ICD-10-CM | POA: Diagnosis present

## 2021-08-02 DIAGNOSIS — Z79899 Other long term (current) drug therapy: Secondary | ICD-10-CM | POA: Diagnosis not present

## 2021-08-02 DIAGNOSIS — J309 Allergic rhinitis, unspecified: Secondary | ICD-10-CM | POA: Diagnosis present

## 2021-08-02 DIAGNOSIS — Z23 Encounter for immunization: Secondary | ICD-10-CM | POA: Diagnosis present

## 2021-08-02 DIAGNOSIS — F0280 Dementia in other diseases classified elsewhere without behavioral disturbance: Secondary | ICD-10-CM | POA: Diagnosis present

## 2021-08-02 DIAGNOSIS — B029 Zoster without complications: Secondary | ICD-10-CM | POA: Diagnosis present

## 2021-08-02 DIAGNOSIS — J449 Chronic obstructive pulmonary disease, unspecified: Secondary | ICD-10-CM | POA: Diagnosis present

## 2021-08-02 DIAGNOSIS — H9193 Unspecified hearing loss, bilateral: Secondary | ICD-10-CM | POA: Diagnosis present

## 2021-08-02 DIAGNOSIS — Z20822 Contact with and (suspected) exposure to covid-19: Secondary | ICD-10-CM | POA: Diagnosis present

## 2021-08-02 DIAGNOSIS — G309 Alzheimer's disease, unspecified: Secondary | ICD-10-CM | POA: Diagnosis present

## 2021-08-02 DIAGNOSIS — I10 Essential (primary) hypertension: Secondary | ICD-10-CM | POA: Diagnosis not present

## 2021-08-02 DIAGNOSIS — I129 Hypertensive chronic kidney disease with stage 1 through stage 4 chronic kidney disease, or unspecified chronic kidney disease: Secondary | ICD-10-CM | POA: Diagnosis present

## 2021-08-02 DIAGNOSIS — E785 Hyperlipidemia, unspecified: Secondary | ICD-10-CM | POA: Diagnosis present

## 2021-08-02 DIAGNOSIS — Z66 Do not resuscitate: Secondary | ICD-10-CM | POA: Diagnosis present

## 2021-08-02 DIAGNOSIS — N1832 Chronic kidney disease, stage 3b: Secondary | ICD-10-CM | POA: Diagnosis present

## 2021-08-02 DIAGNOSIS — H353220 Exudative age-related macular degeneration, left eye, stage unspecified: Secondary | ICD-10-CM | POA: Diagnosis present

## 2021-08-02 DIAGNOSIS — R296 Repeated falls: Secondary | ICD-10-CM | POA: Diagnosis present

## 2021-08-02 DIAGNOSIS — Z87891 Personal history of nicotine dependence: Secondary | ICD-10-CM | POA: Diagnosis not present

## 2021-08-02 LAB — BASIC METABOLIC PANEL
Anion gap: 8 (ref 5–15)
BUN: 16 mg/dL (ref 8–23)
CO2: 25 mmol/L (ref 22–32)
Calcium: 8.4 mg/dL — ABNORMAL LOW (ref 8.9–10.3)
Chloride: 109 mmol/L (ref 98–111)
Creatinine, Ser: 0.94 mg/dL (ref 0.44–1.00)
GFR, Estimated: 56 mL/min — ABNORMAL LOW (ref >60)
Glucose, Bld: 93 mg/dL (ref 70–99)
Potassium: 3.8 mmol/L (ref 3.5–5.1)
Sodium: 142 mmol/L (ref 135–145)

## 2021-08-02 LAB — CBC
HCT: 38 % (ref 36.0–46.0)
Hemoglobin: 12.7 g/dL (ref 12.0–15.0)
MCH: 30.8 pg (ref 26.0–34.0)
MCHC: 33.4 g/dL (ref 30.0–36.0)
MCV: 92.2 fL (ref 80.0–100.0)
Platelets: 189 10*3/uL (ref 150–400)
RBC: 4.12 MIL/uL (ref 3.87–5.11)
RDW: 14.3 % (ref 11.5–15.5)
WBC: 7.5 10*3/uL (ref 4.0–10.5)
nRBC: 0 % (ref 0.0–0.2)

## 2021-08-02 MED ORDER — POTASSIUM CHLORIDE 20 MEQ PO PACK
40.0000 meq | PACK | Freq: Once | ORAL | Status: AC
  Administered 2021-08-02: 40 meq via ORAL
  Filled 2021-08-02: qty 2

## 2021-08-02 MED ORDER — IPRATROPIUM-ALBUTEROL 0.5-2.5 (3) MG/3ML IN SOLN
3.0000 mL | Freq: Four times a day (QID) | RESPIRATORY_TRACT | Status: DC
  Filled 2021-08-02: qty 3

## 2021-08-02 MED ORDER — POLYETHYLENE GLYCOL 3350 17 G PO PACK
17.0000 g | PACK | Freq: Every day | ORAL | Status: DC
  Administered 2021-08-02 – 2021-08-03 (×2): 17 g via ORAL
  Filled 2021-08-02 (×2): qty 1

## 2021-08-02 MED ORDER — IPRATROPIUM-ALBUTEROL 0.5-2.5 (3) MG/3ML IN SOLN: 3.0000 mL | RESPIRATORY_TRACT | Status: DC | PRN

## 2021-08-02 MED ORDER — VALACYCLOVIR HCL 500 MG PO TABS
1000.0000 mg | ORAL_TABLET | Freq: Two times a day (BID) | ORAL | Status: DC
  Administered 2021-08-02 – 2021-08-03 (×2): 1000 mg via ORAL
  Filled 2021-08-02 (×3): qty 2

## 2021-08-02 MED ORDER — SENNOSIDES-DOCUSATE SODIUM 8.6-50 MG PO TABS
2.0000 | ORAL_TABLET | Freq: Every day | ORAL | Status: DC
  Filled 2021-08-02 (×2): qty 2

## 2021-08-02 NOTE — Progress Notes (Signed)
OT Screen Note  Patient Details Name: Makayla [name] MRN: [id number] DOB: 1923/09/20   Cancelled Treatment:    Reason Eval/Treat Not Completed: OT screened, no needs identified, will sign off. OT orders received and chart reviewed. Upon arrival to pt room, pt with caregiver in bathroom toileting without hospital assistance. Per pt/caregiver pt at or near baseline level of functional independence. Pt reports she is eager to get home to see her dog, but otherwise has no concerns regarding ADL performance. No skilled OT needs identified. Will sign off at this time. Please re-consult if additional OT needs arise during this hospital stay.   Rockney Ghee, M.S., OTR/L Ascom: [phone] 08/02/21, 3:11 PM

## 2021-08-02 NOTE — Progress Notes (Signed)
[facility] PROGRESS NOTE   Makayla [name] HDQ:[id number] DOB: Dec 11, 1922 DOA: 08/01/2021  PCP: McLean-Scocuzza, Pasty Spillers, MD  Brief History/Interval Summary: 87 y.o. female with medical history significant for hypertension, dementia, mild COPD, who presented to the emergency department from home for chief concerns of facial swelling/redness.  She was thought to have facial cellulitis.  She was hospitalized for further management.  Consultants: Infectious disease  Procedures: None    Subjective/Interval History: Patient is pleasantly confused.  Denies any complaints at this time.  Daughter is at the bedside.  Patient does have dementia.     Assessment/Plan:  Left facial cellulitis Patient was initially given Unasyn in the emergency department.  Subsequently placed on vancomycin and cefepime.  Seen by infectious disease and antibiotics were changed over to doxycycline.  Also placed on valacyclovir due to concern for HSV.  Follow-up on cultures. Imaging studies did not show any abscess.  Significant sinus disease is noted however she does not have any sinus tenderness at this time. WBC is normal.  Procalcitonin 0.11.  No evidence for sepsis.  History of COPD with wheezing Was not wheezing when I examined her earlier today.  Notified subsequently by nursing staff that she started wheezing.  X-ray of the chest done yesterday did not show any acute findings.  Saturations are 92 to 93% on room air.  Her home inhaler is being continued.  We will add nebulizer treatments.  If she does not improve then we need to use systemic steroids but will try to avoid in this elderly patient with dementia and with an active infection.  Essential hypertension Continue medications including losartan.  She is also on hydralazine as needed.  History of Alzheimer's dementia Continue donepezil and memantine.  Pleasantly confused this morning.   DVT Prophylaxis: Lovenox Code Status: DNR Family  Communication: Discussed with her daughter Disposition Plan: Hopefully return home when improved.  Mobilize.  PT and OT evaluation.  Status is: Observation  The patient will require care spanning > 2 midnights and should be moved to inpatient because: IV treatments appropriate due to intensity of illness or inability to take PO and Inpatient level of care appropriate due to severity of illness  Dispo: The patient is from: Home              Anticipated d/c is to: Home              Patient currently is not medically stable to d/c.   Difficult to place patient No     Medications: Scheduled:  aspirin EC  81 mg Oral Daily   donepezil  10 mg Oral QHS   enoxaparin (LOVENOX) injection  40 mg Subcutaneous Q24H   fluticasone furoate-vilanterol  1 puff Inhalation Daily   And   umeclidinium bromide  1 puff Inhalation Daily   influenza vaccine adjuvanted  0.5 mL Intramuscular Tomorrow-1000   ipratropium-albuterol  3 mL Nebulization QID   losartan  25 mg Oral Daily   memantine  10 mg Oral BID   montelukast  10 mg Oral Daily   polyethylene glycol  17 g Oral Daily   senna-docusate  2 tablet Oral QHS   valACYclovir  1,000 mg Oral Daily   Continuous:  doxycycline (VIBRAMYCIN) IV 100 mg (08/02/21 0021)   JJH:ERDEYCXKGYJEH **OR** acetaminophen, hydrALAZINE, ondansetron **OR** ondansetron (ZOFRAN) IV  Antibiotics: Anti-infectives (From admission, onward)    Start     Dose/Rate Route Frequency Ordered Stop   08/03/21 1500  vancomycin (VANCOREADY) IVPB 1500  mg/300 mL  Status:  Discontinued        1,500 mg 150 mL/hr over 120 Minutes Intravenous Every 48 hours 08/01/21 1511 08/01/21 2209   08/01/21 2325  doxycycline (VIBRAMYCIN) 100 mg in sodium chloride 0.9 % 250 mL IVPB        100 mg 125 mL/hr over 120 Minutes Intravenous Every 12 hours 08/01/21 2325     08/01/21 1700  ceFEPIme (MAXIPIME) 2 g in sodium chloride 0.9 % 100 mL IVPB  Status:  Discontinued        2 g 200 mL/hr over 30 Minutes  Intravenous Every 12 hours 08/01/21 1422 08/01/21 1619   08/01/21 1700  valACYclovir (VALTREX) tablet 1,000 mg        1,000 mg Oral Daily 08/01/21 1654     08/01/21 1600  vancomycin (VANCOREADY) IVPB 1500 mg/300 mL        1,500 mg 150 mL/hr over 120 Minutes Intravenous  Once 08/01/21 1422 08/01/21 1736   08/01/21 1530  doxycycline (VIBRAMYCIN) 100 mg in sodium chloride 0.9 % 250 mL IVPB  Status:  Discontinued        100 mg 125 mL/hr over 120 Minutes Intravenous Every 12 hours 08/01/21 1519 08/01/21 2325   08/01/21 1415  Ampicillin-Sulbactam (UNASYN) 3 g in sodium chloride 0.9 % 100 mL IVPB        3 g 200 mL/hr over 30 Minutes Intravenous  Once 08/01/21 1402 08/01/21 1448       Objective:  Vital Signs  Vitals:   08/01/21 2051 08/02/21 0019 08/02/21 0458 08/02/21 0809  BP: (!) 152/93 (!) 134/48 (!) 145/70 (!) 151/67  Pulse: 72 75 82 63  Resp: 20 16  18   Temp: (!) 97.3 F (36.3 C) 98 F (36.7 C) 97.9 F (36.6 C) 98 F (36.7 C)  TempSrc: Oral  Oral Oral  SpO2: 96% 92% 91% 93%  Weight: 71 kg     Height: 5\' 7"  (1.702 m)       Intake/Output Summary (Last 24 hours) at 08/02/2021 1025 Last data filed at 08/02/2021 1018 Gross per 24 hour  Intake 1137.52 ml  Output --  Net 1137.52 ml   Filed Weights   08/01/21 2051  Weight: 71 kg    General appearance: Awake alert.  In no distress.  Pleasantly confused Erythema noted over the left face along with the left neck area.  No sinus tenderness appreciated.  Blackish scabby areas noted on the left side of the face as well.  No vesicular lesions noted Resp: Clear to auscultation bilaterally.  Normal effort Cardio: S1-S2 is normal regular.  No S3-S4.  No rubs murmurs or bruit GI: Abdomen is soft.  Nontender nondistended.  Bowel sounds are present normal.  No masses organomegaly Extremities: No edema.  Moving all of her extremities. Neurologic: Disoriented.  No focal neurological deficits.    Lab Results:  Data Reviewed: I have  personally reviewed following labs and imaging studies  CBC: Recent Labs  Lab 08/01/21 0817 08/02/21 0410  WBC 6.9 7.5  HGB 14.8 12.7  HCT 44.0 38.0  MCV 95.0 92.2  PLT 197 189    Basic Metabolic Panel: Recent Labs  Lab 08/01/21 0817 08/02/21 0410  NA 141 142  K 4.1 3.8  CL 104 109  CO2 29 25  GLUCOSE 92 93  BUN 17 16  CREATININE 1.06* 0.94  CALCIUM 9.3 8.4*    GFR: Estimated Creatinine Clearance: 34 mL/min (by C-G formula based on SCr of 0.94  mg/dL).   Recent Results (from the past 240 hour(s))  CULTURE, BLOOD (ROUTINE X 2) w Reflex to ID Panel     Status: None (Preliminary result)   Collection Time: 08/01/21  8:17 AM   Specimen: BLOOD  Result Value Ref Range Status   Specimen Description BLOOD RIGHT ANTECUBITAL  Final   Special Requests   Final    BOTTLES DRAWN AEROBIC AND ANAEROBIC Blood Culture results may not be optimal due to an excessive volume of blood received in culture bottles   Culture   Final    NO GROWTH < 24 HOURS Performed at [facility], [address], [place], [place] [address]    Report Status PENDING  Incomplete  Resp Panel by RT-PCR (Flu A&B, Covid) Nasopharyngeal Swab     Status: None   Collection Time: 08/01/21  2:18 PM   Specimen: Nasopharyngeal Swab; Nasopharyngeal(NP) swabs in vial transport medium  Result Value Ref Range Status   SARS Coronavirus 2 by RT PCR NEGATIVE NEGATIVE Final    Comment: (NOTE) SARS-CoV-2 target nucleic acids are NOT DETECTED.  The SARS-CoV-2 RNA is generally detectable in upper respiratory specimens during the acute phase of infection. The lowest concentration of SARS-CoV-2 viral copies this assay can detect is 138 copies/mL. A negative result does not preclude SARS-Cov-2 infection and should not be used as the sole basis for treatment or other patient management decisions. A negative result may occur with  improper specimen collection/handling, submission of specimen other than nasopharyngeal  swab, presence of viral mutation(s) within the areas targeted by this assay, and inadequate number of viral copies(<138 copies/mL). A negative result must be combined with clinical observations, patient history, and epidemiological information. The expected result is Negative.  Fact Sheet for Patients:  BloggerCourse.com  Fact Sheet for Healthcare Providers:  SeriousBroker.it  This test is no t yet approved or cleared by the [place] FDA and  has been authorized for detection and/or diagnosis of SARS-CoV-2 by FDA under an Emergency Use Authorization (EUA). This EUA will remain  in effect (meaning this test can be used) for the duration of the COVID-19 declaration under Section 564(b)(1) of the Act, 21 U.S.C.section 360bbb-3(b)(1), unless the authorization is terminated  or revoked sooner.       Influenza A by PCR NEGATIVE NEGATIVE Final   Influenza B by PCR NEGATIVE NEGATIVE Final    Comment: (NOTE) The Xpert Xpress SARS-CoV-2/FLU/RSV plus assay is intended as an aid in the diagnosis of influenza from Nasopharyngeal swab specimens and should not be used as a sole basis for treatment. Nasal washings and aspirates are unacceptable for Xpert Xpress SARS-CoV-2/FLU/RSV testing.  Fact Sheet for Patients: BloggerCourse.com  Fact Sheet for Healthcare Providers: SeriousBroker.it  This test is not yet approved or cleared by the [place] FDA and has been authorized for detection and/or diagnosis of SARS-CoV-2 by FDA under an Emergency Use Authorization (EUA). This EUA will remain in effect (meaning this test can be used) for the duration of the COVID-19 declaration under Section 564(b)(1) of the Act, 21 U.S.C. section 360bbb-3(b)(1), unless the authorization is terminated or revoked.  Performed at [facility], [address] [place], [place] [address]   MRSA  Next Gen by PCR, Nasal     Status: None   Collection Time: 08/01/21  4:08 PM   Specimen: Nasal Mucosa; Nasal Swab  Result Value Ref Range Status   MRSA by PCR Next Gen NOT DETECTED NOT DETECTED Final    Comment: (NOTE)  The GeneXpert MRSA Assay (FDA approved for NASAL specimens only), is one component of a comprehensive MRSA colonization surveillance program. It is not intended to diagnose MRSA infection nor to guide or monitor treatment for MRSA infections. Test performance is not FDA approved in patients less than 39 years old. Performed at [facility], [address] [place], [place] [address]   CULTURE, BLOOD (ROUTINE X 2) w Reflex to ID Panel     Status: None (Preliminary result)   Collection Time: 08/01/21  4:48 PM   Specimen: BLOOD  Result Value Ref Range Status   Specimen Description BLOOD BLOOD LEFT HAND  Final   Special Requests   Final    BOTTLES DRAWN AEROBIC AND ANAEROBIC Blood Culture adequate volume   Culture   Final    NO GROWTH < 24 HOURS Performed at [facility], [address] [place], [place] [address]    Report Status PENDING  Incomplete      Radiology Studies: CT Soft Tissue Neck W Contrast  Result Date: 08/01/2021 CLINICAL DATA:  Cellulitis, neck EXAM: CT NECK WITH CONTRAST TECHNIQUE: Multidetector CT imaging of the neck was performed using the standard protocol following the bolus administration of intravenous contrast. CONTRAST:  59mL OMNIPAQUE IOHEXOL 350 MG/ML SOLN COMPARISON:  None. FINDINGS: Pharynx and larynx: Streak artifact from dental amalgam limits evaluation of the oropharynx without visible mass or edema. Significantly motion limited evaluation of the larynx, essentially nondiagnostic. Salivary glands: There is edema/stranding in the left submandibular space; however, the submandibular glands are similar in size and the left submandibular gland does not appear edematous. Mild intraglandular ductal dilation is symmetric. No  calculi identified. The parotid glands are symmetric and within normal limits. Thyroid: Normal. Lymph nodes: Asymmetric and borderline enlarged left submandibular lymph nodes, without evidence of suppurative change. Vascular: Atherosclerosis. Limited evaluation due to non arterial timing and motion with the major arteries in the neck appearing grossly patent. Limited intracranial: Negative. Visualized orbits: Negative. Mastoids and visualized paranasal sinuses: Evaluated on concurrent CT face. Skeleton: Moderate multilevel degenerative change of the cervical spine. Upper chest: Motion limited evaluation. No consolidation. Mosaic attenuation, likely related to expiratory imaging. Calcified granuloma in the right upper lobe. Other: Soft tissue stranding/edema of the subcutaneous soft tissues of the left face, extending into the left upper neck and involving the submandibular space. Associated mild thickening of the left platysma. No discrete drainable fluid collection identified. IMPRESSION: 1. Edema of the subcutaneous soft tissues of the left face extending into left upper neck, nonspecific but compatible with reported cellulitis. No discrete, drainable fluid collection identified. 2. See concurrent CT face for characterization of the sinuses. 3. Asymmetric and borderline enlarged left submandibular lymph nodes, nonspecific but likely reactive given the above findings. Electronically Signed   By: Feliberto Harts M.D.   On: 08/01/2021 13:06   CT Maxillofacial W Contrast  Result Date: 08/01/2021 CLINICAL DATA:  Cellulitis, face EXAM: CT MAXILLOFACIAL WITH CONTRAST TECHNIQUE: Multidetector CT imaging of the maxillofacial structures was performed with intravenous contrast. Multiplanar CT image reconstructions were also generated. CONTRAST:  47mL OMNIPAQUE IOHEXOL 350 MG/ML SOLN COMPARISON:  None. FINDINGS: Osseous: No fracture or mandibular dislocation. No destructive process. Maxillary torus. Orbits: Negative. No  traumatic or inflammatory finding. Sinuses: Complete opacification of the right frontal sinus with opacification of multiple ethmoid air cells bilaterally. Moderate left and mild right maxillary sinus mucosal thickening. Mild mucosal thickening left sphenoid sinus with frothy secretions. Left inferior maxillary sinus mucosal thickening may be odontogenic  given periapical lucency of the left second maxillary molar. Soft tissues: Swelling/soft tissue stranding involving the left cheek, extending inferiorly to involve the upper neck and left submandibular space. Mild associated thickening of the left platysma. The submandibular glands and parotid glands are symmetric and within normal limits. Multiple borderline enlarged left submandibular lymph nodes without suppurative change. Limited intracranial: No significant or unexpected finding. IMPRESSION: 1. Findings compatible with cellulitis of the left face, extending into left upper neck as detailed above. No discrete, drainable fluid collection. 2. Moderate to severe paranasal sinus mucosal thickening, greatest in the right frontal sinus, ethmoid air cells and inferior left maxillary sinus as detailed above. Left inferior maxillary sinus mucosal thickening may be odontogenic given periapical lucency of the left second maxillary molar. 3. Borderline enlarged left submandibular lymph nodes, nonspecific but likely reactive given above findings. Electronically Signed   By: Feliberto Harts M.D.   On: 08/01/2021 12:59   DG Chest Port 1 View  Result Date: 08/01/2021 CLINICAL DATA:  87 year old female with history of shortness of breath. History of asthma. COPD. EXAM: PORTABLE CHEST 1 VIEW COMPARISON:  Chest x-ray 01/07/2020. FINDINGS: Lung volumes are low. Bibasilar linear opacities are noted, which may reflect areas of subsegmental atelectasis and/or scarring, slightly increased compared to the prior study. No confluent consolidative airspace disease. No pleural  effusions. No pneumothorax. No definite suspicious appearing pulmonary nodules or masses are noted. Widespread interstitial prominence and peribronchial cuffing is noted. Pulmonary vasculature does not appearing origin. Heart size appears borderline enlarged. The patient is rotated to the right on today's exam, resulting in distortion of the mediastinal contours and reduced diagnostic sensitivity and specificity for mediastinal pathology. Atherosclerotic calcifications in the thoracic aorta. IMPRESSION: 1. The appearance the chest suggests acute bronchitis, as above. 2. Borderline cardiomegaly. 3. Aortic atherosclerosis. 4. Low lung volumes with bibasilar areas of subsegmental atelectasis and/or scarring, slightly increased compared to the prior study. Electronically Signed   By: Trudie Reed M.D.   On: 08/01/2021 14:57       LOS: 0 days   Analeise Mccleery Rito Ehrlich  [facility] Pager on [email or web address]  08/02/2021, 10:25 AM

## 2021-08-02 NOTE — Progress Notes (Signed)
PHARMACY NOTE:  ANTIMICROBIAL RENAL DOSAGE ADJUSTMENT  Current antimicrobial regimen includes a mismatch between antimicrobial dosage and estimated renal function.  As per policy approved by the Pharmacy & Therapeutics and Medical Executive Committees, the antimicrobial dosage will be adjusted accordingly.  Current antimicrobial dosage: valacyclovir 1gm q24h  Indication: rule out Herpes simplex/zoster  Renal Function:  Estimated Creatinine Clearance: 34 mL/min (by C-G formula based on SCr of 0.94 mg/dL). []      On intermittent HD, scheduled: []      On CRRT    Antimicrobial dosage has been changed to:  valacyclovir 1gm q12h  Additional comments:   Thank you for allowing pharmacy to be a part of this patient's care.  , PharmD, BCPS.   Work Cell: [phone] 08/02/2021 4:31 PM

## 2021-08-02 NOTE — Evaluation (Signed)
Physical Therapy Evaluation Patient Details Name: Makayla [name] MRN: [id number] DOB: [birth year] Today's Date: 08/02/2021  History of Present Illness  87 y.o. female with medical history significant for hypertension, dementia, mild COPD, who presented to the emergency department from home for chief concerns of facial swelling/redness.  She was thought to have facial cellulitis.  She was hospitalized for further management.    Clinical Impression  Pt received in Semi-Fowler's position and agreeable to therapy.  Pt performed well with mobility in bed, transfer, and ambulation.  No LOB noted, and ambulates well with SPC.  Caregiver notes that this is her baseline level.  Patient is at baseline, all education completed, and time is given to address all questions/concerns. No additional skilled PT services needed at this time, PT signing off. PT recommends daily ambulation ad lib or with nursing staff as needed to prevent deconditioning.         Recommendations for follow up therapy are one component of a multi-disciplinary discharge planning process, led by the attending physician.  Recommendations may be updated based on patient status, additional functional criteria and insurance authorization.  Follow Up Recommendations No PT follow up    Equipment Recommendations  None recommended by PT    Recommendations for Other Services       Precautions / Restrictions Precautions Precautions: None Restrictions Weight Bearing Restrictions: No      Mobility  Bed Mobility Overal bed mobility: Modified Independent                  Transfers Overall transfer level: Modified independent Equipment used: None                Ambulation/Gait Ambulation/Gait assistance: Min guard Gait Distance (Feet): 180 Feet Assistive device: Straight cane Gait Pattern/deviations: WFL(Within Functional Limits) Gait velocity: slightly decreased   General Gait Details: Pt ambulates well with SPC.   No noted loss of balance and was able to perform head turns without veering in any direction as well.  Stairs            Wheelchair Mobility    Modified Rankin (Stroke Patients Only)       Balance Overall balance assessment: Mild deficits observed, not formally tested                                           Pertinent Vitals/Pain Pain Assessment: No/denies pain    Home Living Family/patient expects to be discharged to:: Private residence Living Arrangements: Children (Daughter) Available Help at Discharge: Family;Personal care attendant;Available 24 hours/day Type of Home: House Home Access: Stairs to enter Entrance Stairs-Rails: None Entrance Stairs-Number of Steps: 5 Home Layout: Two level;Full bath on main level;Able to live on main level with bedroom/bathroom Home Equipment: Gilmer Mor - single point;Shower seat;Grab bars - tub/shower;Hand held Careers information officer - 2 wheels      Prior Function Level of Independence: Needs assistance   Gait / Transfers Assistance Needed: Pt requires assistance from caregiver every time she gets up  ADL's / Homemaking Assistance Needed: Caregiver assists with all ADL's        Hand Dominance   Dominant Hand: Right    Extremity/Trunk Assessment   Upper Extremity Assessment Upper Extremity Assessment: Generalized weakness    Lower Extremity Assessment Lower Extremity Assessment: Generalized weakness       Communication   Communication: No difficulties  Cognition Arousal/Alertness: Awake/alert Behavior  During Therapy: WFL for tasks assessed/performed Overall Cognitive Status: Within Functional Limits for tasks assessed                                        General Comments      Exercises Other Exercises Other Exercises: Pt and caregiver educated on role of PT and services provided during hospital stay.  Therapist also educated pt and caregiver of the importance of remaining active  during hsopital stay in order to decrease muscle atrophy that occurs with inactivity.   Assessment/Plan    PT Assessment Patent does not need any further PT services  PT Problem List Decreased strength       PT Treatment Interventions      PT Goals (Current goals can be found in the Care Plan section)  Acute Rehab PT Goals Patient Stated Goal: To go home. PT Goal Formulation: With patient Time For Goal Achievement: 08/16/21 Potential to Achieve Goals: Good    Frequency     Barriers to discharge        Co-evaluation               AM-PAC PT "6 Clicks" Mobility  Outcome Measure Help needed turning from your back to your side while in a flat bed without using bedrails?: None Help needed moving from lying on your back to sitting on the side of a flat bed without using bedrails?: None Help needed moving to and from a bed to a chair (including a wheelchair)?: None Help needed standing up from a chair using your arms (e.g., wheelchair or bedside chair)?: None Help needed to walk in hospital room?: None Help needed climbing 3-5 steps with a railing? : A Little 6 Click Score: 23    End of Session Equipment Utilized During Treatment: Gait belt Activity Tolerance: Patient tolerated treatment well Patient left: in bed;with call bell/phone within reach;with family/visitor present Nurse Communication: Mobility status PT Visit Diagnosis: Unsteadiness on feet (R26.81);Repeated falls (R29.6);History of falling (Z91.81)    Time: 0109-3235 PT Time Calculation (min) (ACUTE ONLY): 30 min   Charges:     PT Treatments $Gait Training: 23-37 mins        Nolon Bussing, PT, DPT 08/02/21, 1:03 PM   Phineas Real 08/02/2021, 1:01 PM

## 2021-08-02 NOTE — Evaluation (Signed)
Clinical/Bedside Swallow Evaluation Patient Details  Name: Makayla [name] MRN: [id number] Date of Birth: [birth year]  Today's Date: 08/02/2021 Time: SLP Start Time (ACUTE ONLY): 1150 SLP Stop Time (ACUTE ONLY): 1240 SLP Time Calculation (min) (ACUTE ONLY): 50 min  Past Medical History:  Past Medical History:  Diagnosis Date   Allergy    Alzheimer's dementia (HCC)    CT head 08/25/15 mild cortical atrophy and chronic small vessel ischemic change. atheroscloerosis. paranasal sinus disease    Anosmia    Asthma    Chicken pox    COPD (chronic obstructive pulmonary disease) (HCC)    Emphysema of lung (HCC)    per daughter never smoker exposed 2nd have via husband   Hard of hearing    since childhood   Hypertension    Nasal polyps    surgery x 15 years ago and in ~2013 as of 10/08/17   Syncope    03/2017 thought 2/2 diuretic stopped in [facility]    UTI (urinary tract infection)    Past Surgical History:  Past Surgical History:  Procedure Laterality Date   ABDOMINAL HYSTERECTOMY     ? year ? reason ; partial    BREAST BIOPSY     ? year    nasal polyps removed     HPI:  pt is a 87 y.o. female with medical history significant for hypertension, dementia, mild COPD, who presented to the emergency department from home for chief concerns of facial swelling/redness.  She was thought to have facial cellulitis.  She was hospitalized for further management.  CT scan: Edema of the subcutaneous soft tissues of the left face extending  into left upper neck, nonspecific but compatible with reported  cellulitis.  CXR: Bibasilar linear opacities are noted, which  may reflect areas of subsegmental atelectasis and/or scarring,  slightly increased compared to the prior study. No confluent  consolidative airspace disease. No pleural effusions.    Assessment / Plan / Recommendation  Clinical Impression  Pt appears to present w/ adequate oropharyngeal phase swallow function w/ No overt oropharyngeal phase  dysphagia noted, Pt if of advanced age (87yo) and does have a baseline of declined Cognitive status; Dementia. This can impact her overall awareness/timing of swallow and safety during po tasks which increases risk for aspiration, choking. It deserves monitoring. Pt consumed po trials w/ No overt, clinical s/s of aspiration during po trials. Pt appears at reduced risk for aspiration following general aspiration precautions. Personal Sitter present stated she "cuts all foods" for pt.    During po trials, pt consumed consistencies w/ no overt coughing, decline in vocal quality, or change in respiratory presentation during/post trials. Oral phase appeared [facility] w/ timely bolus management and control of bolus propulsion for A-P transfer for swallowing. Oral clearing achieved w/ all trial consistencies. OM Exam appeared [facility] w/ no unilateral weakness noted. Speech Clear. Native dentition. Pt fed self w/ setup support.   Recommend a Regular/Mech Soft consistency diet w/ well-Cut meats, moistened foods; Thin liquids VIA CUP - pt does not often use straws at home, and Cup drinking provides for more oral control of bolus during sips and can lessen risk for aspiration. Recommend general aspiration precautions, Pills WHOLE in Puree for safer, easier swallowing. Education given on Pills in Puree; food consistencies and easy to eat options; general aspiration precautions. NSG to reconsult if any new needs arise. NSG agreed. SLP Visit Diagnosis: Dysphagia, unspecified (R13.10)    Aspiration Risk  Mild aspiration risk;Risk for inadequate nutrition/hydration (reduced  following general precautions)    Diet Recommendation   Regular/Mech Soft consistency diet w/ well-Cut meats, moistened foods; Thin liquids VIA CUP - pt does not often use straws at home, and Cup drinking provides for more oral control of bolus during sips and can lessen risk for aspiration. Recommend general aspiration precautions and Support at meals w/  eating/drinking d/t Baseline Cognitive decline.  Medication Administration: Whole meds with puree (for safer swallowing)    Other  Recommendations Recommended Consults:  (dietician f/u if needed) Oral Care Recommendations: Oral care BID;Oral care before and after PO;Staff/trained caregiver to provide oral care Other Recommendations:  (n/a)    Recommendations for follow up therapy are one component of a multi-disciplinary discharge planning process, led by the attending physician.  Recommendations may be updated based on patient status, additional functional criteria and insurance authorization.  Follow up Recommendations None      Frequency and Duration  (n/a)   (n/a)       Prognosis Prognosis for Safe Diet Advancement: Good Barriers to Reach Goals: Cognitive deficits;Time post onset;Severity of deficits      Swallow Study   General Date of Onset: 08/01/21 HPI: pt is a 88 y.o. female with medical history significant for hypertension, dementia, mild COPD, who presented to the emergency department from home for chief concerns of facial swelling/redness.  She was thought to have facial cellulitis.  She was hospitalized for further management.  CT scan: Edema of the subcutaneous soft tissues of the left face extending  into left upper neck, nonspecific but compatible with reported  cellulitis.  CXR: Bibasilar linear opacities are noted, which  may reflect areas of subsegmental atelectasis and/or scarring,  slightly increased compared to the prior study. No confluent  consolidative airspace disease. No pleural effusions. Type of Study: Bedside Swallow Evaluation Previous Swallow Assessment: none Diet Prior to this Study: Regular;Thin liquids (personal Sitter cuts all foods for pt) Temperature Spikes Noted: No (wbc 7.5) Respiratory Status: Room air History of Recent Intubation: No Behavior/Cognition: Alert;Cooperative;Pleasant mood;Confused;Distractible;Requires cueing Oral Cavity  Assessment: Within Functional Limits Oral Care Completed by SLP: Recent completion by staff Oral Cavity - Dentition: Adequate natural dentition Vision: Functional for self-feeding Self-Feeding Abilities: Able to feed self;Needs assist;Needs set up (support) Patient Positioning: Upright in bed (needed positioning support for more Upright sitting) Baseline Vocal Quality: Normal;Low vocal intensity Volitional Cough: Strong Volitional Swallow: Able to elicit    Oral/Motor/Sensory Function Overall Oral Motor/Sensory Function: Within functional limits   Ice Chips Ice chips: Not tested   Thin Liquid Thin Liquid: Within functional limits Presentation: Cup;Self Fed (6 trials)    Nectar Thick Nectar Thick Liquid: Not tested   Honey Thick Honey Thick Liquid: Not tested   Puree Puree: Within functional limits Presentation: Spoon (3 trials)   Solid     Solid:  (bites at her lunch meal w/ Sitter) Other Comments: no deficits reported        Jerilynn Som, MS, [facility] [phone] [place] Feldpausch 08/02/2021,2:51 PM

## 2021-08-02 NOTE — Progress Notes (Signed)
ID Pt feeling better Pain face better Home attendant at bedside  O/e awake and alert Patient Vitals for the past 24 hrs:  BP Temp Temp src Pulse Resp SpO2  08/02/21 2038 (!) 150/59 (!) 97.5 F (36.4 C) Oral 71 16 93 %  08/02/21 1743 (!) 144/76 97.8 F (36.6 C) -- 70 16 97 %  08/02/21 1310 -- -- -- 65 18 94 %  08/02/21 1305 (!) 146/80 98.5 F (36.9 C) -- 64 18 94 %  08/02/21 0809 (!) 151/67 98 F (36.7 C) Oral 63 18 93 %  08/02/21 0458 (!) 145/70 97.9 F (36.6 C) Oral 82 -- 91 %  08/02/21 0019 (!) 134/48 98 F (36.7 C) -- 75 16 92 %    Chest CTA Hss1s Fac- left side skin scabs and eschar improving 'surrounding erythema and swelling better    Labs CBC Latest Ref Rng & Units 08/02/2021 08/01/2021 01/13/2021  WBC 4.0 - 10.5 K/uL 7.5 6.9 8.9  Hemoglobin 12.0 - 15.0 g/dL 43.1 54.0 08.6  Hematocrit 36.0 - 46.0 % 38.0 44.0 44.9  Platelets 150 - 400 K/uL 189 197 206.0     CMP Latest Ref Rng & Units 08/02/2021 08/01/2021 01/13/2021  Glucose 70 - 99 mg/dL 93 92 78  BUN 8 - 23 mg/dL 16 17 76(P)  Creatinine 0.44 - 1.00 mg/dL 9.50 9.32(I) 7.12  Sodium 135 - 145 mmol/L 142 141 141  Potassium 3.5 - 5.1 mmol/L 3.8 4.1 4.8  Chloride 98 - 111 mmol/L 109 104 104  CO2 22 - 32 mmol/L 25 29 30   Calcium 8.9 - 10.3 mg/dL ) 9.3 9.5  Total Protein 6.0 - 8.3 g/dL - - 6.6  Total Bilirubin 0.2 - 1.2 mg/dL - - 0.5  Alkaline Phos 39 - 117 U/L - - 73  AST 0 - 37 U/L - - 15  ALT 0 - 35 U/L - - 13     Impresison/recommendation Left side of the face lesions wit surrounding erythema Staph infection VS VZV Vs secondarily infected VZV HSV/VZV PCR sent Culture for bacteria sent Pt on vanco( got a dose yesterday , next dose tomorrow) On doxy- change to PO Also on valtrext adjusted to crcl Will deice on final antibiotic tomorrow Discussed the management with patient and care team

## 2021-08-03 ENCOUNTER — Other Ambulatory Visit (HOSPITAL_COMMUNITY): Payer: Self-pay

## 2021-08-03 DIAGNOSIS — L03211 Cellulitis of face: Secondary | ICD-10-CM | POA: Diagnosis not present

## 2021-08-03 LAB — BASIC METABOLIC PANEL WITH GFR
Anion gap: 5 (ref 5–15)
BUN: 15 mg/dL (ref 8–23)
CO2: 26 mmol/L (ref 22–32)
Calcium: 8.5 mg/dL — ABNORMAL LOW (ref 8.9–10.3)
Chloride: 110 mmol/L (ref 98–111)
Creatinine, Ser: 1.08 mg/dL — ABNORMAL HIGH (ref 0.44–1.00)
GFR, Estimated: 47 mL/min — ABNORMAL LOW
Glucose, Bld: 88 mg/dL (ref 70–99)
Potassium: 4.1 mmol/L (ref 3.5–5.1)
Sodium: 141 mmol/L (ref 135–145)

## 2021-08-03 LAB — MAGNESIUM: Magnesium: 1.9 mg/dL (ref 1.7–2.4)

## 2021-08-03 MED ORDER — VALACYCLOVIR HCL 1 G PO TABS: 1000.0000 mg | ORAL_TABLET | Freq: Every day | ORAL | 0 refills | Status: AC

## 2021-08-03 MED ORDER — SENNOSIDES-DOCUSATE SODIUM 8.6-50 MG PO TABS: 2.0000 | ORAL_TABLET | Freq: Every day | ORAL | 0 refills | Status: AC

## 2021-08-03 MED ORDER — ACETAMINOPHEN 325 MG PO TABS: 650.0000 mg | ORAL_TABLET | Freq: Four times a day (QID) | ORAL | Status: DC | PRN

## 2021-08-03 MED ORDER — VALACYCLOVIR HCL 500 MG PO TABS: 1000.0000 mg | ORAL_TABLET | Freq: Every day | ORAL | Status: DC

## 2021-08-03 MED ORDER — DOXYCYCLINE HYCLATE 100 MG PO TABS: 100.0000 mg | ORAL_TABLET | Freq: Two times a day (BID) | ORAL | Status: DC

## 2021-08-03 MED ORDER — DOXYCYCLINE HYCLATE 100 MG PO TABS: 100.0000 mg | ORAL_TABLET | Freq: Two times a day (BID) | ORAL | 0 refills | Status: AC

## 2021-08-03 MED ORDER — POLYETHYLENE GLYCOL 3350 17 G PO PACK: 17.0000 g | PACK | Freq: Every day | ORAL | 0 refills | Status: DC

## 2021-08-03 NOTE — Progress Notes (Signed)
[facility] PROGRESS NOTE   Lanetra Hartley FBX:[id number] DOB: [birth year] DOA: 08/01/2021  PCP: McLean-Scocuzza, Pasty Spillers, MD  Brief History/Interval Summary: 87 y.o. female with medical history significant for hypertension, dementia, mild COPD, who presented to the emergency department from home for chief concerns of facial swelling/redness.  She was thought to have facial cellulitis.  She was hospitalized for further management.  Consultants: Infectious disease  Procedures: None    Subjective/Interval History: Patient remains pleasantly confused.  Daughter is at bedside.  No overnight issues.  Daughter reports that patient has had chronic cough for several decades.  She also has wheezing on a chronic basis.  Denies any pain over her left face.    Assessment/Plan:  Left facial cellulitis Patient was initially given Unasyn in the emergency department.  Subsequently placed on vancomycin and cefepime.  Seen by infectious disease and antibiotics were changed over to doxycycline.  Also placed on valacyclovir due to concern for HSV.   Cultures have been negative so far.   Imaging studies did not show any abscess.  Significant sinus disease is noted however she does not have any sinus tenderness at this time. WBC was normal.  Patient is afebrile. Procalcitonin 0.11.  No evidence for sepsis. Waiting for further recommendations from infectious disease.  History of COPD with wheezing These are chronic symptoms per patient's daughter.  Nebulizer treatment as needed.  No wheezing appreciated today.  Respiratory effort is normal.  Saturations are normal on room air.  Continue inhalers.    Essential hypertension Continue medications including losartan.  She is also on hydralazine as needed.  History of Alzheimer's dementia Continue donepezil and memantine.     DVT Prophylaxis: Lovenox Code Status: DNR Family Communication: Discussed with her daughter Disposition Plan: Hopefully  return home either later today or tomorrow.  Status is: Inpatient  Remains inpatient appropriate because:IV treatments appropriate due to intensity of illness or inability to take PO  Dispo:  Patient From: Home  Planned Disposition: Home  Medically stable for discharge: No         Medications: Scheduled:  aspirin EC  81 mg Oral Daily   donepezil  10 mg Oral QHS   enoxaparin (LOVENOX) injection  40 mg Subcutaneous Q24H   fluticasone furoate-vilanterol  1 puff Inhalation Daily   And   umeclidinium bromide  1 puff Inhalation Daily   influenza vaccine adjuvanted  0.5 mL Intramuscular Tomorrow-1000   losartan  25 mg Oral Daily   memantine  10 mg Oral BID   montelukast  10 mg Oral Daily   polyethylene glycol  17 g Oral Daily   senna-docusate  2 tablet Oral QHS   valACYclovir  1,000 mg Oral BID   Continuous:  doxycycline (VIBRAMYCIN) IV 100 mg (08/03/21 0040)   NVB:TYOMAYOKHTXHF **OR** acetaminophen, hydrALAZINE, ipratropium-albuterol, ondansetron **OR** ondansetron (ZOFRAN) IV  Antibiotics: Anti-infectives (From admission, onward)    Start     Dose/Rate Route Frequency Ordered Stop   08/03/21 1500  vancomycin (VANCOREADY) IVPB 1500 mg/300 mL  Status:  Discontinued        1,500 mg 150 mL/hr over 120 Minutes Intravenous Every 48 hours 08/01/21 1511 08/01/21 2209   08/02/21 2200  valACYclovir (VALTREX) tablet 1,000 mg        1,000 mg Oral 2 times daily 08/02/21 1629     08/01/21 2325  doxycycline (VIBRAMYCIN) 100 mg in sodium chloride 0.9 % 250 mL IVPB        100 mg 125 mL/hr over 120  Minutes Intravenous Every 12 hours 08/01/21 2325     08/01/21 1700  ceFEPIme (MAXIPIME) 2 g in sodium chloride 0.9 % 100 mL IVPB  Status:  Discontinued        2 g 200 mL/hr over 30 Minutes Intravenous Every 12 hours 08/01/21 1422 08/01/21 1619   08/01/21 1700  valACYclovir (VALTREX) tablet 1,000 mg  Status:  Discontinued        1,000 mg Oral Daily 08/01/21 1654 08/02/21 1629   08/01/21 1600   vancomycin (VANCOREADY) IVPB 1500 mg/300 mL        1,500 mg 150 mL/hr over 120 Minutes Intravenous  Once 08/01/21 1422 08/01/21 1736   08/01/21 1530  doxycycline (VIBRAMYCIN) 100 mg in sodium chloride 0.9 % 250 mL IVPB  Status:  Discontinued        100 mg 125 mL/hr over 120 Minutes Intravenous Every 12 hours 08/01/21 1519 08/01/21 2325   08/01/21 1415  Ampicillin-Sulbactam (UNASYN) 3 g in sodium chloride 0.9 % 100 mL IVPB        3 g 200 mL/hr over 30 Minutes Intravenous  Once 08/01/21 1402 08/01/21 1448       Objective:  Vital Signs  Vitals:   08/02/21 2038 08/03/21 0041 08/03/21 0456 08/03/21 0739  BP: (!) 150/59 (!) 141/61 (!) 159/65 (!) 152/68  Pulse: 71 69 64 65  Resp: 16 16 18 20   Temp: (!) 97.5 F (36.4 C) 98.5 F (36.9 C) 97.9 F (36.6 C) 97.9 F (36.6 C)  TempSrc: Oral   Oral  SpO2: 93% 92% 95% 92%  Weight:      Height:        Intake/Output Summary (Last 24 hours) at 08/03/2021 1036 Last data filed at 08/02/2021 1856 Gross per 24 hour  Intake 480 ml  Output --  Net 480 ml    Filed Weights   08/01/21 2051  Weight: 71 kg    General appearance: Awake alert.  In no distress.  Pleasantly confused. Improving erythema over the left face as well as left neck area. Black appearing scab like lesions appear to be stable. Resp: Normal effort at rest.  Coarse breath sounds.  Occasional wheezing.  No rhonchi. Cardio: S1-S2 is normal regular.  No S3-S4.  No rubs murmurs or bruit GI: Abdomen is soft.  Nontender nondistended.  Bowel sounds are present normal.  No masses organomegaly Extremities: No edema.   Neurologic:  No focal neurological deficits.     Lab Results:  Data Reviewed: I have personally reviewed following labs and imaging studies  CBC: Recent Labs  Lab 08/01/21 0817 08/02/21 0410  WBC 6.9 7.5  HGB 14.8 12.7  HCT 44.0 38.0  MCV 95.0 92.2  PLT 197 189     Basic Metabolic Panel: Recent Labs  Lab 08/01/21 0817 08/02/21 0410 08/03/21 0426   NA 141 142 141  K 4.1 3.8 4.1  CL 104 109 110  CO2 29 25 26   GLUCOSE 92 93 88  BUN 17 16 15   CREATININE 1.06* 0.94 1.08*  CALCIUM 9.3 8.4* 8.5*  MG  --   --  1.9     GFR: Estimated Creatinine Clearance: 29.6 mL/min (A) (by C-G formula based on SCr of 1.08 mg/dL (H)).   Recent Results (from the past 240 hour(s))  CULTURE, BLOOD (ROUTINE X 2) w Reflex to ID Panel     Status: None (Preliminary result)   Collection Time: 08/01/21  8:17 AM   Specimen: BLOOD  Result Value Ref Range  Status   Specimen Description BLOOD RIGHT ANTECUBITAL  Final   Special Requests   Final    BOTTLES DRAWN AEROBIC AND ANAEROBIC Blood Culture results may not be optimal due to an excessive volume of blood received in culture bottles   Culture   Final    NO GROWTH 2 DAYS Performed at [facility], [address] [place], [place] [address]    Report Status PENDING  Incomplete  Resp Panel by RT-PCR (Flu A&B, Covid) Nasopharyngeal Swab     Status: None   Collection Time: 08/01/21  2:18 PM   Specimen: Nasopharyngeal Swab; Nasopharyngeal(NP) swabs in vial transport medium  Result Value Ref Range Status   SARS Coronavirus 2 by RT PCR NEGATIVE NEGATIVE Final    Comment: (NOTE) SARS-CoV-2 target nucleic acids are NOT DETECTED.  The SARS-CoV-2 RNA is generally detectable in upper respiratory specimens during the acute phase of infection. The lowest concentration of SARS-CoV-2 viral copies this assay can detect is 138 copies/mL. A negative result does not preclude SARS-Cov-2 infection and should not be used as the sole basis for treatment or other patient management decisions. A negative result may occur with  improper specimen collection/handling, submission of specimen other than nasopharyngeal swab, presence of viral mutation(s) within the areas targeted by this assay, and inadequate number of viral copies(<138 copies/mL). A negative result must be combined with clinical observations, patient  history, and epidemiological information. The expected result is Negative.  Fact Sheet for Patients:  BloggerCourse.com  Fact Sheet for Healthcare Providers:  SeriousBroker.it  This test is no t yet approved or cleared by the [place] FDA and  has been authorized for detection and/or diagnosis of SARS-CoV-2 by FDA under an Emergency Use Authorization (EUA). This EUA will remain  in effect (meaning this test can be used) for the duration of the COVID-19 declaration under Section 564(b)(1) of the Act, 21 U.S.C.section 360bbb-3(b)(1), unless the authorization is terminated  or revoked sooner.       Influenza A by PCR NEGATIVE NEGATIVE Final   Influenza B by PCR NEGATIVE NEGATIVE Final    Comment: (NOTE) The Xpert Xpress SARS-CoV-2/FLU/RSV plus assay is intended as an aid in the diagnosis of influenza from Nasopharyngeal swab specimens and should not be used as a sole basis for treatment. Nasal washings and aspirates are unacceptable for Xpert Xpress SARS-CoV-2/FLU/RSV testing.  Fact Sheet for Patients: BloggerCourse.com  Fact Sheet for Healthcare Providers: SeriousBroker.it  This test is not yet approved or cleared by the [place] FDA and has been authorized for detection and/or diagnosis of SARS-CoV-2 by FDA under an Emergency Use Authorization (EUA). This EUA will remain in effect (meaning this test can be used) for the duration of the COVID-19 declaration under Section 564(b)(1) of the Act, 21 U.S.C. section 360bbb-3(b)(1), unless the authorization is terminated or revoked.  Performed at [facility], [address] [place], [place] [address]   MRSA Next Gen by PCR, Nasal     Status: None   Collection Time: 08/01/21  4:08 PM   Specimen: Nasal Mucosa; Nasal Swab  Result Value Ref Range Status   MRSA by PCR Next Gen NOT DETECTED NOT DETECTED Final     Comment: (NOTE) The GeneXpert MRSA Assay (FDA approved for NASAL specimens only), is one component of a comprehensive MRSA colonization surveillance program. It is not intended to diagnose MRSA infection nor to guide or monitor treatment for MRSA infections. Test performance is not FDA approved in patients less than  90 years old. Performed at [facility], [address] [place], [place] [address]   CULTURE, BLOOD (ROUTINE X 2) w Reflex to ID Panel     Status: None (Preliminary result)   Collection Time: 08/01/21  4:48 PM   Specimen: BLOOD  Result Value Ref Range Status   Specimen Description BLOOD BLOOD LEFT HAND  Final   Special Requests   Final    BOTTLES DRAWN AEROBIC AND ANAEROBIC Blood Culture adequate volume   Culture   Final    NO GROWTH 2 DAYS Performed at [facility], [address] [place], [place] [address]    Report Status PENDING  Incomplete  Aerobic Culture w Gram Stain (superficial specimen)     Status: None (Preliminary result)   Collection Time: 08/02/21  3:03 PM   Specimen: Face; Wound  Result Value Ref Range Status   Specimen Description   Final    FACE Performed at [facility], [address] [place], [place] [address]    Special Requests   Final    NONE Performed at [facility], [address] [place], [place] [address]    Gram Stain   Final    NO SQUAMOUS EPITHELIAL CELLS SEEN NO WBC SEEN NO ORGANISMS SEEN Performed at [facility], [address], [place], [place] [address]    Culture PENDING  Incomplete   Report Status PENDING  Incomplete       Radiology Studies: CT Soft Tissue Neck W Contrast  Result Date: 08/01/2021 CLINICAL DATA:  Cellulitis, neck EXAM: CT NECK WITH CONTRAST TECHNIQUE: Multidetector CT imaging of the neck was performed using the standard protocol following the bolus administration of intravenous contrast. CONTRAST:  41mL OMNIPAQUE IOHEXOL 350 MG/ML SOLN COMPARISON:  None.  FINDINGS: Pharynx and larynx: Streak artifact from dental amalgam limits evaluation of the oropharynx without visible mass or edema. Significantly motion limited evaluation of the larynx, essentially nondiagnostic. Salivary glands: There is edema/stranding in the left submandibular space; however, the submandibular glands are similar in size and the left submandibular gland does not appear edematous. Mild intraglandular ductal dilation is symmetric. No calculi identified. The parotid glands are symmetric and within normal limits. Thyroid: Normal. Lymph nodes: Asymmetric and borderline enlarged left submandibular lymph nodes, without evidence of suppurative change. Vascular: Atherosclerosis. Limited evaluation due to non arterial timing and motion with the major arteries in the neck appearing grossly patent. Limited intracranial: Negative. Visualized orbits: Negative. Mastoids and visualized paranasal sinuses: Evaluated on concurrent CT face. Skeleton: Moderate multilevel degenerative change of the cervical spine. Upper chest: Motion limited evaluation. No consolidation. Mosaic attenuation, likely related to expiratory imaging. Calcified granuloma in the right upper lobe. Other: Soft tissue stranding/edema of the subcutaneous soft tissues of the left face, extending into the left upper neck and involving the submandibular space. Associated mild thickening of the left platysma. No discrete drainable fluid collection identified. IMPRESSION: 1. Edema of the subcutaneous soft tissues of the left face extending into left upper neck, nonspecific but compatible with reported cellulitis. No discrete, drainable fluid collection identified. 2. See concurrent CT face for characterization of the sinuses. 3. Asymmetric and borderline enlarged left submandibular lymph nodes, nonspecific but likely reactive given the above findings. Electronically Signed   By: Feliberto Harts M.D.   On: 08/01/2021 13:06   CT Maxillofacial W  Contrast  Result Date: 08/01/2021 CLINICAL DATA:  Cellulitis, face EXAM: CT MAXILLOFACIAL WITH CONTRAST TECHNIQUE: Multidetector CT imaging of the maxillofacial structures was  performed with intravenous contrast. Multiplanar CT image reconstructions were also generated. CONTRAST:  57mL OMNIPAQUE IOHEXOL 350 MG/ML SOLN COMPARISON:  None. FINDINGS: Osseous: No fracture or mandibular dislocation. No destructive process. Maxillary torus. Orbits: Negative. No traumatic or inflammatory finding. Sinuses: Complete opacification of the right frontal sinus with opacification of multiple ethmoid air cells bilaterally. Moderate left and mild right maxillary sinus mucosal thickening. Mild mucosal thickening left sphenoid sinus with frothy secretions. Left inferior maxillary sinus mucosal thickening may be odontogenic given periapical lucency of the left second maxillary molar. Soft tissues: Swelling/soft tissue stranding involving the left cheek, extending inferiorly to involve the upper neck and left submandibular space. Mild associated thickening of the left platysma. The submandibular glands and parotid glands are symmetric and within normal limits. Multiple borderline enlarged left submandibular lymph nodes without suppurative change. Limited intracranial: No significant or unexpected finding. IMPRESSION: 1. Findings compatible with cellulitis of the left face, extending into left upper neck as detailed above. No discrete, drainable fluid collection. 2. Moderate to severe paranasal sinus mucosal thickening, greatest in the right frontal sinus, ethmoid air cells and inferior left maxillary sinus as detailed above. Left inferior maxillary sinus mucosal thickening may be odontogenic given periapical lucency of the left second maxillary molar. 3. Borderline enlarged left submandibular lymph nodes, nonspecific but likely reactive given above findings. Electronically Signed   By: Feliberto Harts M.D.   On: 08/01/2021 12:59    DG Chest Port 1 View  Result Date: 08/01/2021 CLINICAL DATA:  87 year old female with history of shortness of breath. History of asthma. COPD. EXAM: PORTABLE CHEST 1 VIEW COMPARISON:  Chest x-ray 01/07/2020. FINDINGS: Lung volumes are low. Bibasilar linear opacities are noted, which may reflect areas of subsegmental atelectasis and/or scarring, slightly increased compared to the prior study. No confluent consolidative airspace disease. No pleural effusions. No pneumothorax. No definite suspicious appearing pulmonary nodules or masses are noted. Widespread interstitial prominence and peribronchial cuffing is noted. Pulmonary vasculature does not appearing origin. Heart size appears borderline enlarged. The patient is rotated to the right on today's exam, resulting in distortion of the mediastinal contours and reduced diagnostic sensitivity and specificity for mediastinal pathology. Atherosclerotic calcifications in the thoracic aorta. IMPRESSION: 1. The appearance the chest suggests acute bronchitis, as above. 2. Borderline cardiomegaly. 3. Aortic atherosclerosis. 4. Low lung volumes with bibasilar areas of subsegmental atelectasis and/or scarring, slightly increased compared to the prior study. Electronically Signed   By: Trudie Reed M.D.   On: 08/01/2021 14:57       LOS: 1 day   Joelle Roswell  [facility] Pager on [email or web address]  08/03/2021, 10:36 AM

## 2021-08-03 NOTE — Discharge Summary (Signed)
[facility]  Physician Discharge Summary   Patient ID: Makayla [name] MRN: [id number] DOB/AGE: [id number] y.o.  Admit date: 08/01/2021 Discharge date: 08/03/2021    PCP: McLean-Scocuzza, Pasty Spillers, MD  DISCHARGE DIAGNOSES:  Left facial cellulitis Concern for HSV History of COPD with chronic cough and wheezing Essential hypertension Alzheimer's dementia   RECOMMENDATIONS FOR OUTPATIENT FOLLOW UP: Follow-up with PCP in 1 to 2 weeks   Home Health: None Equipment/Devices: None  CODE STATUS: DNR  DISCHARGE CONDITION: fair  Diet recommendation: As before  INITIAL HISTORY: 87 y.o. female with medical history significant for hypertension, dementia, mild COPD, who presented to the emergency department from home for chief concerns of facial swelling/redness.  She was thought to have facial cellulitis.  She was hospitalized for further management.   Consultants: Infectious disease   Procedures: None   HOSPITAL COURSE:   Left facial cellulitis Patient was initially given Unasyn in the emergency department.  Subsequently placed on vancomycin and cefepime.  Seen by infectious disease and antibiotics were changed over to doxycycline.  Also placed on valacyclovir due to concern for HSV.   Cultures have been negative so far.   Imaging studies did not show any abscess.  Significant sinus disease is noted however she does not have any sinus tenderness at this time. WBC was normal.  Patient is afebrile. Procalcitonin 0.11.  No evidence for sepsis. ID recommends treatment with doxycycline and valacyclovir for a few more days.  Clinically she has improved.  Okay for discharge today.   History of COPD with wheezing These are chronic symptoms per patient's daughter.  Nebulizer treatment as needed.  No wheezing appreciated today.  Respiratory effort is normal.  Saturations are normal on room air.  Continue inhalers.    Essential hypertension Continue medications including losartan.   She is also on hydralazine as needed.  History of Alzheimer's dementia Continue donepezil and memantine.    Patient is stable.  Okay for discharge home.   PERTINENT LABS:  The results of significant diagnostics from this hospitalization (including imaging, microbiology, ancillary and laboratory) are listed below for reference.    Microbiology: Recent Results (from the past 240 hour(s))  CULTURE, BLOOD (ROUTINE X 2) w Reflex to ID Panel     Status: None (Preliminary result)   Collection Time: 08/01/21  8:17 AM   Specimen: BLOOD  Result Value Ref Range Status   Specimen Description BLOOD RIGHT ANTECUBITAL  Final   Special Requests   Final    BOTTLES DRAWN AEROBIC AND ANAEROBIC Blood Culture results may not be optimal due to an excessive volume of blood received in culture bottles   Culture   Final    NO GROWTH 3 DAYS Performed at [facility], [address] [place], [place] [address]    Report Status PENDING  Incomplete  Resp Panel by RT-PCR (Flu A&B, Covid) Nasopharyngeal Swab     Status: None   Collection Time: 08/01/21  2:18 PM   Specimen: Nasopharyngeal Swab; Nasopharyngeal(NP) swabs in vial transport medium  Result Value Ref Range Status   SARS Coronavirus 2 by RT PCR NEGATIVE NEGATIVE Final    Comment: (NOTE) SARS-CoV-2 target nucleic acids are NOT DETECTED.  The SARS-CoV-2 RNA is generally detectable in upper respiratory specimens during the acute phase of infection. The lowest concentration of SARS-CoV-2 viral copies this assay can detect is 138 copies/mL. A negative result does not preclude SARS-Cov-2 infection and should not be used as the sole basis for treatment or other  patient management decisions. A negative result may occur with  improper specimen collection/handling, submission of specimen other than nasopharyngeal swab, presence of viral mutation(s) within the areas targeted by this assay, and inadequate number of viral copies(<138 copies/mL). A  negative result must be combined with clinical observations, patient history, and epidemiological information. The expected result is Negative.  Fact Sheet for Patients:  BloggerCourse.com  Fact Sheet for Healthcare Providers:  SeriousBroker.it  This test is no t yet approved or cleared by the [place] FDA and  has been authorized for detection and/or diagnosis of SARS-CoV-2 by FDA under an Emergency Use Authorization (EUA). This EUA will remain  in effect (meaning this test can be used) for the duration of the COVID-19 declaration under Section 564(b)(1) of the Act, 21 U.S.C.section 360bbb-3(b)(1), unless the authorization is terminated  or revoked sooner.       Influenza A by PCR NEGATIVE NEGATIVE Final   Influenza B by PCR NEGATIVE NEGATIVE Final    Comment: (NOTE) The Xpert Xpress SARS-CoV-2/FLU/RSV plus assay is intended as an aid in the diagnosis of influenza from Nasopharyngeal swab specimens and should not be used as a sole basis for treatment. Nasal washings and aspirates are unacceptable for Xpert Xpress SARS-CoV-2/FLU/RSV testing.  Fact Sheet for Patients: BloggerCourse.com  Fact Sheet for Healthcare Providers: SeriousBroker.it  This test is not yet approved or cleared by the [place] FDA and has been authorized for detection and/or diagnosis of SARS-CoV-2 by FDA under an Emergency Use Authorization (EUA). This EUA will remain in effect (meaning this test can be used) for the duration of the COVID-19 declaration under Section 564(b)(1) of the Act, 21 U.S.C. section 360bbb-3(b)(1), unless the authorization is terminated or revoked.  Performed at [facility], [address] [place], [place] [address]   MRSA Next Gen by PCR, Nasal     Status: None   Collection Time: 08/01/21  4:08 PM   Specimen: Nasal Mucosa; Nasal Swab  Result Value Ref  Range Status   MRSA by PCR Next Gen NOT DETECTED NOT DETECTED Final    Comment: (NOTE) The GeneXpert MRSA Assay (FDA approved for NASAL specimens only), is one component of a comprehensive MRSA colonization surveillance program. It is not intended to diagnose MRSA infection nor to guide or monitor treatment for MRSA infections. Test performance is not FDA approved in patients less than 60 years old. Performed at [facility], [address] [place], [place] [address]   CULTURE, BLOOD (ROUTINE X 2) w Reflex to ID Panel     Status: None (Preliminary result)   Collection Time: 08/01/21  4:48 PM   Specimen: BLOOD  Result Value Ref Range Status   Specimen Description BLOOD BLOOD LEFT HAND  Final   Special Requests   Final    BOTTLES DRAWN AEROBIC AND ANAEROBIC Blood Culture adequate volume   Culture   Final    NO GROWTH 3 DAYS Performed at [facility], [address], [place], [place] [address]    Report Status PENDING  Incomplete  Aerobic Culture w Gram Stain (superficial specimen)     Status: None (Preliminary result)   Collection Time: 08/02/21  3:03 PM   Specimen: Face; Wound  Result Value Ref Range Status   Specimen Description   Final    FACE Performed at [facility], [address], [place], [place] [address]    Special Requests   Final    NONE Performed at [facility], [address] [place], [place] [address]    Gram Stain   Final    NO SQUAMOUS EPITHELIAL CELLS SEEN NO WBC SEEN NO ORGANISMS SEEN    Culture   Final    CULTURE REINCUBATED FOR BETTER GROWTH Performed at [facility], [address], [place], [place] [address]    Report Status PENDING  Incomplete     Labs:  COVID-19 Labs   Lab Results  Component Value Date   SARSCOV2NAA NEGATIVE 08/01/2021      Basic Metabolic Panel: Recent Labs  Lab 08/01/21 0817 08/02/21 0410 08/03/21 0426  NA 141 142 141  K 4.1 3.8 4.1  CL 104 109 110  CO2 29 25  26   GLUCOSE 92 93 88  BUN 17 16 15   CREATININE 1.06* 0.94 1.08*  CALCIUM 9.3 8.4* 8.5*  MG  --   --  1.9    CBC: Recent Labs  Lab 08/01/21 0817 08/02/21 0410  WBC 6.9 7.5  HGB 14.8 12.7  HCT 44.0 38.0  MCV 95.0 92.2  PLT 197 189      IMAGING STUDIES CT Soft Tissue Neck W Contrast  Result Date: 08/01/2021 CLINICAL DATA:  Cellulitis, neck EXAM: CT NECK WITH CONTRAST TECHNIQUE: Multidetector CT imaging of the neck was performed using the standard protocol following the bolus administration of intravenous contrast. CONTRAST:  77mL OMNIPAQUE IOHEXOL 350 MG/ML SOLN COMPARISON:  None. FINDINGS: Pharynx and larynx: Streak artifact from dental amalgam limits evaluation of the oropharynx without visible mass or edema. Significantly motion limited evaluation of the larynx, essentially nondiagnostic. Salivary glands: There is edema/stranding in the left submandibular space; however, the submandibular glands are similar in size and the left submandibular gland does not appear edematous. Mild intraglandular ductal dilation is symmetric. No calculi identified. The parotid glands are symmetric and within normal limits. Thyroid: Normal. Lymph nodes: Asymmetric and borderline enlarged left submandibular lymph nodes, without evidence of suppurative change. Vascular: Atherosclerosis. Limited evaluation due to non arterial timing and motion with the major arteries in the neck appearing grossly patent. Limited intracranial: Negative. Visualized orbits: Negative. Mastoids and visualized paranasal sinuses: Evaluated on concurrent CT face. Skeleton: Moderate multilevel degenerative change of the cervical spine. Upper chest: Motion limited evaluation. No consolidation. Mosaic attenuation, likely related to expiratory imaging. Calcified granuloma in the right upper lobe. Other: Soft tissue stranding/edema of the subcutaneous soft tissues of the left face, extending into the left upper neck and involving the  submandibular space. Associated mild thickening of the left platysma. No discrete drainable fluid collection identified. IMPRESSION: 1. Edema of the subcutaneous soft tissues of the left face extending into left upper neck, nonspecific but compatible with reported cellulitis. No discrete, drainable fluid collection identified. 2. See concurrent CT face for characterization of the sinuses. 3. Asymmetric and borderline enlarged left submandibular lymph nodes, nonspecific but likely reactive given the above findings. Electronically Signed   By: 08/03/2021 M.D.   On: 08/01/2021 13:06   CT Maxillofacial W Contrast  Result Date: 08/01/2021 CLINICAL DATA:  Cellulitis, face EXAM: CT MAXILLOFACIAL WITH CONTRAST TECHNIQUE: Multidetector CT imaging of the maxillofacial structures was performed with intravenous contrast. Multiplanar CT image reconstructions were also generated. CONTRAST:  70mL OMNIPAQUE IOHEXOL 350 MG/ML SOLN COMPARISON:  None. FINDINGS: Osseous: No fracture or mandibular dislocation. No destructive process. Maxillary torus. Orbits: Negative. No traumatic or inflammatory finding. Sinuses: Complete opacification of the right frontal sinus with opacification of multiple ethmoid air cells bilaterally. Moderate left and mild right maxillary sinus  mucosal thickening. Mild mucosal thickening left sphenoid sinus with frothy secretions. Left inferior maxillary sinus mucosal thickening may be odontogenic given periapical lucency of the left second maxillary molar. Soft tissues: Swelling/soft tissue stranding involving the left cheek, extending inferiorly to involve the upper neck and left submandibular space. Mild associated thickening of the left platysma. The submandibular glands and parotid glands are symmetric and within normal limits. Multiple borderline enlarged left submandibular lymph nodes without suppurative change. Limited intracranial: No significant or unexpected finding. IMPRESSION: 1. Findings  compatible with cellulitis of the left face, extending into left upper neck as detailed above. No discrete, drainable fluid collection. 2. Moderate to severe paranasal sinus mucosal thickening, greatest in the right frontal sinus, ethmoid air cells and inferior left maxillary sinus as detailed above. Left inferior maxillary sinus mucosal thickening may be odontogenic given periapical lucency of the left second maxillary molar. 3. Borderline enlarged left submandibular lymph nodes, nonspecific but likely reactive given above findings. Electronically Signed   By: Feliberto Harts M.D.   On: 08/01/2021 12:59   DG Chest Port 1 View  Result Date: 08/01/2021 CLINICAL DATA:  87 year old female with history of shortness of breath. History of asthma. COPD. EXAM: PORTABLE CHEST 1 VIEW COMPARISON:  Chest x-ray 01/07/2020. FINDINGS: Lung volumes are low. Bibasilar linear opacities are noted, which may reflect areas of subsegmental atelectasis and/or scarring, slightly increased compared to the prior study. No confluent consolidative airspace disease. No pleural effusions. No pneumothorax. No definite suspicious appearing pulmonary nodules or masses are noted. Widespread interstitial prominence and peribronchial cuffing is noted. Pulmonary vasculature does not appearing origin. Heart size appears borderline enlarged. The patient is rotated to the right on today's exam, resulting in distortion of the mediastinal contours and reduced diagnostic sensitivity and specificity for mediastinal pathology. Atherosclerotic calcifications in the thoracic aorta. IMPRESSION: 1. The appearance the chest suggests acute bronchitis, as above. 2. Borderline cardiomegaly. 3. Aortic atherosclerosis. 4. Low lung volumes with bibasilar areas of subsegmental atelectasis and/or scarring, slightly increased compared to the prior study. Electronically Signed   By: Trudie Reed M.D.   On: 08/01/2021 14:57    DISCHARGE EXAMINATION: Vitals:    08/03/21 0456 08/03/21 0739 08/03/21 1145 08/03/21 1608  BP: (!) 159/65 (!) 152/68 (!) 130/57 (!) 135/58  Pulse: 64 65 69 70  Resp: Temp: 97.9 F (36.6 C) 97.9 F (36.6 C) 97.8 F (36.6 C) 98.5 F (36.9 C)  TempSrc:  Oral Oral Oral  SpO2: 95% 92% 94% 94%  Weight:      Height:       See progress note from earlier today   DISPOSITION: Home with daughter  Discharge Instructions     Call MD for:  difficulty breathing, headache or visual disturbances   Complete by: As directed    Call MD for:  extreme fatigue   Complete by: As directed    Call MD for:  persistant dizziness or light-headedness   Complete by: As directed    Call MD for:  persistant nausea and vomiting   Complete by: As directed    Call MD for:  severe uncontrolled pain   Complete by: As directed    Call MD for:  temperature >100.4   Complete by: As directed    Diet - low sodium heart healthy   Complete by: As directed    Discharge instructions   Complete by: As directed    Please take your medications as prescribed. Follow up with your  PCP in 1 week.  You were cared for by a hospitalist during your hospital stay. If you have any questions about your discharge medications or the care you received while you were in the hospital after you are discharged, you can call the unit and asked to speak with the hospitalist on call if the hospitalist that took care of you is not available. Once you are discharged, your primary care physician will handle any further medical issues. Please note that NO REFILLS for any discharge medications will be authorized once you are discharged, as it is imperative that you return to your primary care physician (or establish a relationship with a primary care physician if you do not have one) for your aftercare needs so that they can reassess your need for medications and monitor your lab values. If you do not have a primary care physician, you can call [phone] for a physician  referral.   Increase activity slowly   Complete by: As directed    No wound care   Complete by: As directed           Allergies as of 08/03/2021       Reactions   Latex    rash   Skin Adhesives [cyanoacrylate]         Medication List     STOP taking these medications    cephALEXin 500 MG capsule Commonly known as: KEFLEX   sodium chloride 0.65 % Soln nasal spray Commonly known as: OCEAN       TAKE these medications    acetaminophen 325 MG tablet Commonly known as: TYLENOL Take 2 tablets (650 mg total) by mouth every 6 (six) hours as needed for mild pain.   aspirin EC 81 MG tablet Take 81 mg by mouth daily.   Azelastine-Fluticasone 137-50 MCG/ACT Susp PLACE 1 SPRAY INTO THE NOSE 2 (TWO) TIMES DAILY AS NEEDED.   donepezil 10 MG tablet Commonly known as: ARICEPT Take 1 tablet (10 mg total) by mouth at bedtime.   doxycycline 100 MG tablet Commonly known as: VIBRA-TABS Take 1 tablet (100 mg total) by mouth every 12 (twelve) hours for 5 days.   losartan 25 MG tablet Commonly known as: COZAAR Take 1 tablet (25 mg total) by mouth daily.   memantine 10 MG tablet Commonly known as: NAMENDA TAKE 1 TABLET BY MOUTH TWICE A DAY   montelukast 10 MG tablet Commonly known as: SINGULAIR Take 10 mg by mouth daily.   polyethylene glycol 17 g packet Commonly known as: MIRALAX / GLYCOLAX Take 17 g by mouth daily.   senna-docusate 8.6-50 MG tablet Commonly known as: Senokot-S Take 2 tablets by mouth at bedtime for 10 days.   Trelegy Ellipta 100-62.5-25 MCG/INH Aepb Generic drug: Fluticasone-Umeclidin-Vilant Inhale 1 puff into the lungs daily. Rinse mouth after use   valACYclovir 1000 MG tablet Commonly known as: VALTREX Take 1 tablet (1,000 mg total) by mouth daily for 6 days.           TOTAL DISCHARGE TIME: 35 minutes  Leslye Puccini [employer] on [email or web address]  08/04/2021, 11:24 AM

## 2021-08-03 NOTE — Progress Notes (Signed)
Date of Admission:  08/01/2021     ID: Makayla [name] is a 87 y.o. female  Principal Problem:   Cellulitis Active Problems:   Essential hypertension   Mild intermittent asthma without complication   CKD (chronic kidney disease) stage 3, GFR 30-59 ml/min (HCC)   COPD (chronic obstructive pulmonary disease) (HCC)   HLD (hyperlipidemia)   Carotid artery stenosis   AK (actinic keratosis)   Depression   Seborrheic keratoses   Recurrent falls   Fatigue   Bilateral hearing loss   H/O valvular heart disease   Exudative age-related macular degeneration of left eye (HCC)   Facial cellulitis    Subjective: Pt stable She has no compaints She is picking on the face scabs  Medications:   aspirin EC  81 mg Oral Daily   donepezil  10 mg Oral QHS   enoxaparin (LOVENOX) injection  40 mg Subcutaneous Q24H   fluticasone furoate-vilanterol  1 puff Inhalation Daily   And   umeclidinium bromide  1 puff Inhalation Daily   influenza vaccine adjuvanted  0.5 mL Intramuscular Tomorrow-1000   losartan  25 mg Oral Daily   memantine  10 mg Oral BID   montelukast  10 mg Oral Daily   polyethylene glycol  17 g Oral Daily   senna-docusate  2 tablet Oral QHS   valACYclovir  1,000 mg Oral BID    Objective: Vital signs in last 24 hours: Temp:  [97.5 F (36.4 C)-98.5 F (36.9 C)] 97.9 F (36.6 C) (09/28 0739) Pulse Rate:  [64-71] 65 (09/28 0739) Resp:  [16-20] 20 (09/28 0739) BP: (141-159)/(59-80) 152/68 (09/28 0739) SpO2:  [92 %-97 %] 92 % (09/28 0739)  PHYSICAL EXAM:  General: Alert, cooperative, no distress, pleasantly confused Face Lesion son the left side- healing   Erythema much better Lungs: Clear to auscultation bilaterally. No Wheezing or Rhonchi. No rales. Heart: Regular rate and rhythm, no murmur, rub or gallop. Abdomen: Soft, non-tender,not distended. Bowel sounds normal. No masses  Neurologic: Grossly non-focal  Lab Results Recent Labs    08/01/21 0817 08/02/21 0410  08/03/21 0426  WBC 6.9 7.5  --   HGB 14.8 12.7  --   HCT 44.0 38.0  --   NA 141 142 141  K 4.1 3.8 4.1  CL 104 109 110  CO2 29 25 26   BUN 17 16 15   CREATININE 1.06* 0.94 1.08*   Liver Panel No results for input(s): PROT, ALBUMIN, AST, ALT, ALKPHOS, BILITOT, BILIDIR, IBILI in the last 72 hours. Sedimentation Rate No results for input(s): ESRSEDRATE in the last 72 hours. C-Reactive Protein No results for input(s): CRP in the last 72 hours.  Microbiology:  Studies/Results: DG Chest Port 1 View  Result Date: 08/01/2021 CLINICAL DATA:  87 year old female with history of shortness of breath. History of asthma. COPD. EXAM: PORTABLE CHEST 1 VIEW COMPARISON:  Chest x-ray 01/07/2020. FINDINGS: Lung volumes are low. Bibasilar linear opacities are noted, which may reflect areas of subsegmental atelectasis and/or scarring, slightly increased compared to the prior study. No confluent consolidative airspace disease. No pleural effusions. No pneumothorax. No definite suspicious appearing pulmonary nodules or masses are noted. Widespread interstitial prominence and peribronchial cuffing is noted. Pulmonary vasculature does not appearing origin. Heart size appears borderline enlarged. The patient is rotated to the right on today's exam, resulting in distortion of the mediastinal contours and reduced diagnostic sensitivity and specificity for mediastinal pathology. Atherosclerotic calcifications in the thoracic aorta. IMPRESSION: 1. The appearance the chest suggests acute bronchitis,  as above. 2. Borderline cardiomegaly. 3. Aortic atherosclerosis. 4. Low lung volumes with bibasilar areas of subsegmental atelectasis and/or scarring, slightly increased compared to the prior study. Electronically Signed   By: Trudie Reed M.D.   On: 08/01/2021 14:57     Assessment/Plan: Left face lesions with surrounding erythema Bacterial infection like staph VS Zoster VS herpes simplex V secondary infection of the viral  lesions This is not cutaneous anthrax Currently on doxy ( IV changed to PO) end date 08/06/21) and valtex 1 gram once a day dose ( 08/08/21)-  Culture for bacteria and PCR for HSV/VZV swabs sent Pt should not pick on the scab-may be mittens to be used or attendant at bedside to watch closely  Dementia on donepezil memantine  Discussed the management with the team

## 2021-08-04 ENCOUNTER — Telehealth: Payer: Self-pay

## 2021-08-04 LAB — AEROBIC CULTURE W GRAM STAIN (SUPERFICIAL SPECIMEN)
Culture: NORMAL
Gram Stain: NONE SEEN

## 2021-08-04 NOTE — Telephone Encounter (Signed)
Transition Care Management Unsuccessful Follow-up Telephone Call  Date of discharge and from where:  08/03/21 [facility]  Attempts:  1st Attempt  Reason for unsuccessful TCM follow-up call:  Unable to leave message

## 2021-08-05 ENCOUNTER — Telehealth: Payer: Medicare PPO | Admitting: Family

## 2021-08-05 NOTE — Telephone Encounter (Signed)
Transition Care Management Follow-up Telephone Call Date of discharge and from where: 08/03/21 [facility] How have you been since you were released from the hospital? Daughter states patient is at baseline with breathing, facial swelling has improved with a little swelling in the lip area.  Any questions or concerns? No  Items Reviewed: Did the pt receive and understand the discharge instructions provided? Yes  Medications obtained and verified? Yes  Any new allergies since your discharge? Yes  Dietary orders reviewed? Yes, heart healthy diet.  Do you have support at home? Yes , daughter and care giver.   Home Care and Equipment/Supplies: Were home health services ordered? no  Functional Questionnaire: (I = Independent and D = Dependent) ADLs: Care giver assist  Maintaining continence- managed with pad  Transferring/Ambulation- Walker  Managing Meds- daughter manages  Follow up appointments reviewed:  PCP Hospital f/u appt confirmed?  Patient scheduled to see NP virtual visit today 08/05/21 at 4:00.  Are transportation arrangements needed? No  If their condition worsens, is the pt aware to call PCP or go to the Emergency Dept.? Yes Was the patient provided with contact information for the PCP's office or ED? Yes Was to pt encouraged to call back with questions or concerns? Yes

## 2021-08-06 LAB — CULTURE, BLOOD (ROUTINE X 2)
Culture: NO GROWTH
Culture: NO GROWTH
Special Requests: ADEQUATE

## 2021-08-08 LAB — VARICELLA-ZOSTER BY PCR: Varicella-Zoster, PCR: POSITIVE — AB

## 2021-08-08 NOTE — Telephone Encounter (Signed)
Call pt Discharged with cellulitis 08/03/21 Please sch a HFU with one of the providers here within 2 weeks time.  Ensure she is compliant with doxycycline and valacyclovir as provided as discharge  Please ensure no patient concerns

## 2021-08-09 ENCOUNTER — Telehealth: Payer: Self-pay

## 2021-08-09 NOTE — Telephone Encounter (Signed)
Pt has a hospital follow up scheduled for 08/23/21

## 2021-08-09 NOTE — Telephone Encounter (Signed)
Attempted to call patient to discuss culture of facial lesion while admitted. Reached daughter vm and left detailed message explaining that she should have 2 days left of the valtrex and that the culture did confirm varicella Zoster (Shingles). I asked that she call us with update and leave on vm if we are not in office.

## 2021-08-10 NOTE — Telephone Encounter (Signed)
Per MD called patient's daughter to inform her that it will take some time to heal from recent shingles outbreak. Left voicemail with this message. Requested she call if she has any concerns/questions. Juanita Laster, RMA

## 2021-08-10 NOTE — Telephone Encounter (Addendum)
Patient' daughter left voicemail to follow up on missed call from CMA.  States that she has completed Valtrex on 10/4 AM. Is still experiencing some facial pain. Lip swelling has gone down a little bit. Still has scabs on her face that are black in color.  Is taking tylenol in the morning and before bed time to help with face pain. Has noticed some relief while doing this. Daughter has noticed that patient is sleeping more and appetite has decreased. Overall, feels like she is slowly progressing.  Patient is living with her daughter who takes care of her. Does have a caregiver during the day.   Juanita Laster, RMA

## 2021-08-23 ENCOUNTER — Encounter: Payer: Self-pay | Admitting: Family

## 2021-08-23 ENCOUNTER — Ambulatory Visit: Payer: Medicare PPO | Admitting: Family

## 2021-08-23 ENCOUNTER — Other Ambulatory Visit: Payer: Self-pay

## 2021-08-23 VITALS — BP 118/58 | Temp 98.2°F | Ht 67.0 in | Wt 149.8 lb

## 2021-08-23 DIAGNOSIS — L03211 Cellulitis of face: Secondary | ICD-10-CM

## 2021-08-23 DIAGNOSIS — Z66 Do not resuscitate: Secondary | ICD-10-CM | POA: Diagnosis not present

## 2021-08-23 NOTE — Assessment & Plan Note (Signed)
Complicated by varicella.  Symptoms completely resolved at this time.  No residual pain.  Patient eating and drinking normally.  Reviewed hospital course with patient medications reconciled.

## 2021-08-23 NOTE — Patient Instructions (Signed)
Nice to meet you today Let me know if you any concerns

## 2021-08-23 NOTE — Progress Notes (Signed)
Subjective:    Patient ID: Makayla [name], female    DOB: 1923/07/22, 87 y.o.   MRN: [id number]  CC: Makayla [name] is a 87 y.o. female who presents today for hospital follow up.   HPI: Accompanied by daughter today who is primary historian and whom patient lives with.  Mother has dementia, and is also hard of hearing.  Rash left side of facial rash and pain has resolved.   She has completed doxycycline and valacyclovir.   Appetite is back to baseline.   No facial pain, swelling, sinus congestion.   No concerns today.   She is no longer mirtazapine   Patient is now a DNR since hospitalization. Daughter is HCPOA and has completed this paperwork.     Daughter states that she and patient do not want any lifesaving measures for patient including CPR.   Admitted to the hospital 08/01/2021 and discharged 08/03/2021 for left facial cellulitis, concern for HSV.   Hospital course significant for being placed on Unasyn emergency department and subsequently placed on vancomycin, Ceftin 80.  Consult with infectious disease antibiotics were changed to doxycycline and also placed on valacyclovir due to concern for HSV. No evidence of sepsis.  Infectious disease recommended doxycycline and valacyclovir at discharge. Positive varicella-zoster PCR HSV 1 and 2 negative Blood cultures negative bilaterally  hands after 5 days CT maxillofacial , neck and soft tissue showed edema of the left face.  No discrete, drainable fluid collection Chest x-ray shows acute bronchitis, borderline cardiomegaly, aortic atherosclerosis, atelectasis Anaerobic culture with Gram stain negative Normal hemoglobin, hematocrit at discharge Creatinine at discharge 1.08 Patient has history COPD, hypertension, Alzheimer's dementia She is compliant with donepezil and memantine  She is compliant with losartan and takes hydralazine as needed HISTORY:  Past Medical History:  Diagnosis Date   Allergy    Alzheimer's dementia  (HCC)    CT head 08/25/15 mild cortical atrophy and chronic small vessel ischemic change. atheroscloerosis. paranasal sinus disease    Anosmia    Asthma    Chicken pox    COPD (chronic obstructive pulmonary disease) (HCC)    Emphysema of lung (HCC)    per daughter never smoker exposed 2nd have via husband   Hard of hearing    since childhood   Hypertension    Nasal polyps    surgery x 15 years ago and in ~2013 as of 10/08/17   Syncope    03/2017 thought 2/2 diuretic stopped in [facility]    UTI (urinary tract infection)    Past Surgical History:  Procedure Laterality Date   ABDOMINAL HYSTERECTOMY     ? year ? reason ; partial    BREAST BIOPSY     ? year    nasal polyps removed     Family History  Problem Relation Age of Onset   Cancer Mother        ? type   Other Mother        scarlett fever, skin ulcer, died of complications after MVA    Arthritis Mother    Alcohol abuse Paternal Uncle    Arthritis Father     Allergies: Latex and Skin adhesives [cyanoacrylate] Current Outpatient Medications on File Prior to Visit  Medication Sig Dispense Refill   acetaminophen (TYLENOL) 325 MG tablet Take 2 tablets (650 mg total) by mouth every 6 (six) hours as needed for mild pain.     aspirin EC 81 MG tablet Take 81 mg by mouth daily.  Azelastine-Fluticasone 137-50 MCG/ACT SUSP PLACE 1 SPRAY INTO THE NOSE 2 (TWO) TIMES DAILY AS NEEDED. 23 g 11   donepezil (ARICEPT) 10 MG tablet Take 1 tablet (10 mg total) by mouth at bedtime. 90 tablet 3   Fluticasone-Umeclidin-Vilant (TRELEGY ELLIPTA) 100-62.5-25 MCG/INH AEPB Inhale 1 puff into the lungs daily. Rinse mouth after use 60 each 11   losartan (COZAAR) 25 MG tablet Take 1 tablet (25 mg total) by mouth daily. 90 tablet 3   memantine (NAMENDA) 10 MG tablet TAKE 1 TABLET BY MOUTH TWICE A DAY 180 tablet 3   montelukast (SINGULAIR) 10 MG tablet Take 10 mg by mouth daily.     polyethylene glycol (MIRALAX / GLYCOLAX) 17 g packet Take 17 g by  mouth daily. 14 each 0   No current facility-administered medications on file prior to visit.    Social History   Tobacco Use   Smoking status: Former   Smokeless tobacco: Never  Substance Use Topics   Alcohol use: No   Drug use: No    Review of Systems  Constitutional:  Negative for chills and fever.  Respiratory:  Negative for cough.   Cardiovascular:  Negative for chest pain and palpitations.  Gastrointestinal:  Negative for nausea and vomiting.  Skin:  Negative for rash (resolved).     Objective:    BP (!) 118/58 (BP Location: Left Arm, Patient Position: Sitting, Cuff Size: Normal)   Temp 98.2 F (36.8 C) (Oral)   Ht 5\' 7"  (1.702 m)   Wt 149 lb 12.8 oz (67.9 kg)   SpO2 94%   BMI 23.46 kg/m  BP Readings from Last 3 Encounters:  08/23/21 (!) 118/58  08/03/21 (!) 135/58  07/29/21 139/82   Wt Readings from Last 3 Encounters:  08/23/21 149 lb 12.8 oz (67.9 kg)  08/01/21 156 lb 8.4 oz (71 kg)  07/29/21 157 lb 3 oz (71.3 kg)    Physical Exam Vitals reviewed.  Constitutional:      Appearance: She is well-developed.  Eyes:     Conjunctiva/sclera: Conjunctivae normal.  Cardiovascular:     Rate and Rhythm: Normal rate and regular rhythm.     Pulses: Normal pulses.     Heart sounds: Normal heart sounds.  Pulmonary:     Effort: Pulmonary effort is normal.     Breath sounds: Normal breath sounds. No wheezing, rhonchi or rales.  Skin:    General: Skin is warm and dry.  Neurological:     Mental Status: She is alert.  Psychiatric:        Speech: Speech normal.        Behavior: Behavior normal.        Thought Content: Thought content normal.       Assessment & Plan:   Problem List Items Addressed This Visit       Other   DNR (do not resuscitate) - Primary    We had a long discussion in regards to what DO NOT RESUSCITATE order would mean.  She does not want  the use of artificial or heroic means to be kept alive. She would like me to write an order in the  chart that if patient was to die, no attempt to resuscitate will be made. She doesn't want intubation, CPR, ACLS or defibrillation.  She would like comfort care and has completed this in her Living will. Patient's daughter, 07/31/21,    is her health care power of attorney.  All questions answered and patient verbalized understanding  of all. I have signed DO NOT RESUSCITATE order and provided patient with original copy.  Counseled patient on the importance of keeping DNR paperwork at home and visible so that people around her are aware         Relevant Orders   DNR (Do Not Resuscitate)   Facial cellulitis    Complicated by varicella.  Symptoms completely resolved at this time.  No residual pain.  Patient eating and drinking normally.  Reviewed hospital course with patient medications reconciled.        I am having Jakelin Taussig "Skippy" maintain her aspirin EC, Azelastine-Fluticasone, memantine, Trelegy Ellipta, losartan, donepezil, montelukast, polyethylene glycol, and acetaminophen.   No orders of the defined types were placed in this encounter.   Return precautions given.   Risks, benefits, and alternatives of the medications and treatment plan prescribed today were discussed, and patient expressed understanding.   Education regarding symptom management and diagnosis given to patient on AVS.  Continue to follow with McLean-Scocuzza, Pasty Spillers, MD for routine health maintenance.   Vaughan Browner and I agreed with plan.   Rennie Plowman, FNP

## 2021-08-23 NOTE — Assessment & Plan Note (Addendum)
We had a long discussion in regards to what DO NOT RESUSCITATE order would mean.  She does not want  the use of artificial or heroic means to be kept alive. She would like me to write an order in the chart that if patient was to die, no attempt to resuscitate will be made. She doesn't want intubation, CPR, ACLS or defibrillation.  She would like comfort care and has completed this in her Living will. Patient's daughter, Brayton Layman,    is her health care power of attorney.  All questions answered and patient verbalized understanding of all. I have signed DO NOT RESUSCITATE order and provided patient with original copy.  Counseled patient on the importance of keeping DNR paperwork at home and visible so that people around her are aware

## 2021-09-06 ENCOUNTER — Telehealth: Payer: Medicare PPO | Admitting: Family

## 2021-09-07 ENCOUNTER — Inpatient Hospital Stay: Payer: Medicare PPO | Admitting: Family

## 2021-11-08 ENCOUNTER — Other Ambulatory Visit: Payer: Self-pay

## 2021-11-08 ENCOUNTER — Encounter: Payer: Self-pay | Admitting: Internal Medicine

## 2021-11-08 ENCOUNTER — Ambulatory Visit: Payer: Medicare PPO | Admitting: Internal Medicine

## 2021-11-08 VITALS — BP 128/64 | HR 66 | Temp 97.1°F | Ht 67.0 in | Wt 150.9 lb

## 2021-11-08 DIAGNOSIS — F03918 Unspecified dementia, unspecified severity, with other behavioral disturbance: Secondary | ICD-10-CM

## 2021-11-08 DIAGNOSIS — N3000 Acute cystitis without hematuria: Secondary | ICD-10-CM | POA: Diagnosis not present

## 2021-11-08 NOTE — Progress Notes (Signed)
Chief Complaint  Patient presents with   Follow-up   F/u with daughter Stanton Kidney  1. Uti recurrent will check urine today  2. Dementia on aricept 10 mg qd and namenda 10 mg bid memory declining I.e does not remember grandson and 1-2 of other daughters names 3. Left face shingles resolved had 07/2021 ed visit [facility] 9/23 and 08/01/21 txed keflex 500 mg and valtrex    Review of Systems  Constitutional:  Negative for weight loss.  HENT:  Negative for hearing loss.   Eyes:  Negative for blurred vision.  Respiratory:  Negative for shortness of breath.   Cardiovascular:  Negative for chest pain.  Gastrointestinal:  Negative for abdominal pain and blood in stool.  Genitourinary:  Negative for dysuria.  Musculoskeletal:  Negative for falls and joint pain.  Skin:  Negative for rash.  Neurological:  Negative for headaches.  Psychiatric/Behavioral:  Negative for depression.   Past Medical History:  Diagnosis Date   Allergy    Alzheimer's dementia ([place])    CT head 08/25/15 mild cortical atrophy and chronic small vessel ischemic change. atheroscloerosis. paranasal sinus disease    Anosmia    Asthma    Chicken pox    COPD (chronic obstructive pulmonary disease) (HCC)    Emphysema of lung ([place])    per daughter never smoker exposed 2nd have via husband   Hard of hearing    since childhood   Hypertension    Nasal polyps    surgery x 15 years ago and in ~2013 as of 10/08/17   Shingles    left face 07/2021   Syncope    03/2017 thought 2/2 diuretic stopped in [facility]    UTI (urinary tract infection)    Past Surgical History:  Procedure Laterality Date   ABDOMINAL HYSTERECTOMY     ? year ? reason ; partial    BREAST BIOPSY     ? year    nasal polyps removed     Family History  Problem Relation Age of Onset   Cancer Mother        ? type   Other Mother        scarlett fever, skin ulcer, died of complications after MVA    Arthritis Mother    Alcohol abuse Paternal Uncle    Arthritis  Father    Social History   Socioeconomic History   Marital status: Widowed    Spouse name: Not on file   Number of children: Not on file   Years of education: Not on file   Highest education level: Not on file  Occupational History   Not on file  Tobacco Use   Smoking status: Former   Smokeless tobacco: Never  Substance and Sexual Activity   Alcohol use: No   Drug use: No   Sexual activity: Never  Other Topics Concern   Not on file  Social History Narrative   Lives with daughter Stanton Kidney since 45 or 08/2017 just moved from [facility] (also in home grandson and son in Sports [employer])   Widowed   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   Azelastine-Fluticasone 137-50 MCG/ACT SUSP PLACE 1 SPRAY INTO THE NOSE 2 (TWO) TIMES DAILY AS NEEDED.   donepezil (ARICEPT) 10 MG tablet Take 1 tablet (10 mg total) by mouth at bedtime.   Fluticasone-Umeclidin-Vilant (TRELEGY ELLIPTA) 100-62.5-25 MCG/INH AEPB Inhale 1 puff into the lungs daily. Rinse mouth after use   losartan (COZAAR) 25 MG tablet Take 1 tablet (25 mg total) by mouth daily.   memantine (NAMENDA) 10 MG tablet TAKE 1 TABLET BY MOUTH TWICE A DAY   montelukast (SINGULAIR) 10 MG tablet Take 10 mg by mouth daily.   Allergies  Allergen Reactions   Latex     rash   Skin Adhesives [Cyanoacrylate]    No results found for this or any previous visit (from the past 2160 hour(s)). Objective  Body mass index is 23.63 kg/m. Wt Readings from Last 3 Encounters:  11/08/21 150 lb 14.2 oz (68.4 kg)  08/23/21 149 lb 12.8 oz (67.9 kg)   08/01/21 156 lb 8.4 oz (71 kg)   Temp Readings from Last 3 Encounters:  11/08/21 (!) 97.1 F (36.2 C) (Temporal)  08/23/21 98.2 F (36.8 C) (Oral)  08/03/21 98.5 F (36.9 C) (Oral)   BP Readings from Last 3 Encounters:  11/08/21 128/64  08/23/21 (!) 118/58  08/03/21 (!) 135/58   Pulse Readings from Last 3 Encounters:  11/08/21 66  08/03/21 70  07/29/21 70    Physical Exam Vitals and nursing note reviewed.  Constitutional:      Appearance: Normal appearance. She is well-developed and well-groomed.  HENT:     Head: Normocephalic and atraumatic.  Eyes:     Conjunctiva/sclera: Conjunctivae normal.     Pupils: Pupils are equal, round, and reactive to light.  Cardiovascular:     Rate and Rhythm: Normal rate and regular rhythm.     Heart sounds: Murmur heard.  Pulmonary:     Effort: Pulmonary effort is normal.     Breath sounds: Normal breath sounds.  Abdominal:     General: Abdomen is flat. Bowel sounds are normal.     Tenderness: There is no abdominal tenderness.  Musculoskeletal:        General: No tenderness.  Skin:    General: Skin is warm and dry.       Neurological:     General: No focal deficit present.     Mental Status: She is alert and oriented to person, place, and time. Mental status is at baseline.     Cranial Nerves: Cranial nerves 2-12 are intact.     Gait: Gait is intact.  Psychiatric:        Attention and Perception: Attention and perception normal.        Mood and Affect: Mood and affect normal.        Speech: Speech normal.        Behavior: Behavior normal. Behavior is cooperative.        Thought Content: Thought content normal.        Cognition and Memory: Cognition and memory normal.        Judgment: Judgment normal.    Assessment  Plan  Acute cystitis without hematuria - Plan: Urinalysis, Routine w reflex microscopic, Urine Culture  Dementia with behavioral disturbance  Slow decline on aricept 10 mg qd and namenda 10 mg qd    Shingles resolved   HM Had  flu utd prevnar 08/11/17.  Tdap utd shingrix 1/2 check with [facility]  pna 23 had 07/15/18  Possibly had zostavx 09/2014  3/3 covid vxs had [place]    Dermatology seen in 2022  [facility]   Eye Dr. Jacinto Reap Nice appt seen and reported constricted pupils but vision ok-previous appt referred to ophthalmology appt sch 02/2021 [facility]   09/02/14 DEXA reviewed +osteopenia ? If ever had colonoscopy, ? Last mammogram, s/p hysterectomy  Out of age window for all health maintenance    Provider: Dr. Olivia Mackie McLean-Scocuzza-Internal Medicine

## 2021-11-08 NOTE — Patient Instructions (Addendum)
Kefir yogurt lifeway

## 2021-11-10 LAB — MICROSCOPIC MESSAGE

## 2021-11-10 LAB — URINALYSIS, ROUTINE W REFLEX MICROSCOPIC
Bilirubin Urine: NEGATIVE
Glucose, UA: NEGATIVE
Hgb urine dipstick: NEGATIVE
Hyaline Cast: NONE SEEN /LPF
Ketones, ur: NEGATIVE
Nitrite: POSITIVE — AB
Protein, ur: NEGATIVE
RBC / HPF: NONE SEEN /HPF (ref 0–2)
Specific Gravity, Urine: 1.022 (ref 1.001–1.035)
pH: 5.5 (ref 5.0–8.0)

## 2021-11-10 LAB — URINE CULTURE
MICRO NUMBER:: 12820672
SPECIMEN QUALITY:: ADEQUATE

## 2021-11-10 MED ORDER — CIPROFLOXACIN HCL 500 MG PO TABS: 500.0000 mg | ORAL_TABLET | Freq: Two times a day (BID) | ORAL | 0 refills | Status: DC

## 2021-11-10 NOTE — Addendum Note (Signed)
Addended by: Quentin Ore on: 11/10/2021 04:36 PM   Modules accepted: Orders

## 2021-11-21 DIAGNOSIS — H353112 Nonexudative age-related macular degeneration, right eye, intermediate dry stage: Secondary | ICD-10-CM | POA: Diagnosis not present

## 2021-11-29 ENCOUNTER — Encounter: Payer: Self-pay | Admitting: Internal Medicine

## 2021-11-30 ENCOUNTER — Other Ambulatory Visit: Payer: Self-pay | Admitting: Internal Medicine

## 2021-11-30 DIAGNOSIS — N3000 Acute cystitis without hematuria: Secondary | ICD-10-CM

## 2021-11-30 NOTE — Telephone Encounter (Signed)
Patient seen for UTI 11/08/21 and treated with Cipro.   Okay to have Patient complete another urine sample?

## 2021-11-30 NOTE — Addendum Note (Signed)
Addended by: Quentin Ore on: 11/30/2021 09:48 AM   Modules accepted: Orders

## 2021-12-02 LAB — URINE CULTURE
MICRO NUMBER:: 12924216
Result:: NO GROWTH
SPECIMEN QUALITY:: ADEQUATE

## 2022-03-01 ENCOUNTER — Other Ambulatory Visit: Payer: Self-pay | Admitting: Internal Medicine

## 2022-03-01 DIAGNOSIS — F0390 Unspecified dementia without behavioral disturbance: Secondary | ICD-10-CM

## 2022-03-16 ENCOUNTER — Encounter: Payer: Self-pay | Admitting: Internal Medicine

## 2022-03-16 ENCOUNTER — Ambulatory Visit: Payer: Medicare PPO | Admitting: Internal Medicine

## 2022-03-16 VITALS — BP 110/50 | HR 76 | Temp 98.2°F | Resp 14 | Ht 67.0 in | Wt 151.2 lb

## 2022-03-16 DIAGNOSIS — R159 Full incontinence of feces: Secondary | ICD-10-CM

## 2022-03-16 DIAGNOSIS — R5383 Other fatigue: Secondary | ICD-10-CM

## 2022-03-16 DIAGNOSIS — N3000 Acute cystitis without hematuria: Secondary | ICD-10-CM

## 2022-03-16 DIAGNOSIS — Z1329 Encounter for screening for other suspected endocrine disorder: Secondary | ICD-10-CM

## 2022-03-16 DIAGNOSIS — R7303 Prediabetes: Secondary | ICD-10-CM | POA: Diagnosis not present

## 2022-03-16 DIAGNOSIS — J449 Chronic obstructive pulmonary disease, unspecified: Secondary | ICD-10-CM

## 2022-03-16 DIAGNOSIS — I10 Essential (primary) hypertension: Secondary | ICD-10-CM | POA: Diagnosis not present

## 2022-03-16 DIAGNOSIS — E559 Vitamin D deficiency, unspecified: Secondary | ICD-10-CM | POA: Diagnosis not present

## 2022-03-16 DIAGNOSIS — J441 Chronic obstructive pulmonary disease with (acute) exacerbation: Secondary | ICD-10-CM | POA: Diagnosis not present

## 2022-03-16 DIAGNOSIS — F03918 Unspecified dementia, unspecified severity, with other behavioral disturbance: Secondary | ICD-10-CM

## 2022-03-16 MED ORDER — AMOXICILLIN-POT CLAVULANATE 875-125 MG PO TABS: 1.0000 | ORAL_TABLET | Freq: Two times a day (BID) | ORAL | 0 refills | Status: DC

## 2022-03-16 MED ORDER — DONEPEZIL HCL 10 MG PO TABS: 10.0000 mg | ORAL_TABLET | Freq: Every day | ORAL | 3 refills | Status: DC

## 2022-03-16 MED ORDER — LOSARTAN POTASSIUM 25 MG PO TABS: 12.5000 mg | ORAL_TABLET | Freq: Every day | ORAL | 3 refills | Status: DC

## 2022-03-16 MED ORDER — PREDNISONE 20 MG PO TABS: 40.0000 mg | ORAL_TABLET | Freq: Every day | ORAL | 0 refills | Status: DC

## 2022-03-16 MED ORDER — IPRATROPIUM-ALBUTEROL 0.5-2.5 (3) MG/3ML IN SOLN: 3.0000 mL | Freq: Three times a day (TID) | RESPIRATORY_TRACT | 11 refills | Status: DC | PRN

## 2022-03-16 MED ORDER — TRELEGY ELLIPTA 100-62.5-25 MCG/ACT IN AEPB: 1.0000 | INHALATION_SPRAY | Freq: Every day | RESPIRATORY_TRACT | 11 refills | Status: DC

## 2022-03-16 NOTE — Progress Notes (Signed)
Chief Complaint  ?Patient presents with  ? Follow-up  ?  Denies any concerns or pain.   ? ?F/u  ?1. Htn low normal on losartan 25 mg qd and daughter reports she is tired  ?2. Dementia on aricept 10 qd and namenda bid daughter feels like pt is slowly declining and has to ask what to do for instructions and at times difficulty swallowing pills, appetite is ok and she is waking up less at night staying up and wakes up to use the bathroom  ?3. Recurrent uti check urine today ? ? ?Review of Systems  ?Constitutional:  Negative for weight loss.  ?HENT:  Negative for hearing loss.   ?Eyes:  Negative for blurred vision.  ?Respiratory:  Negative for shortness of breath.   ?Cardiovascular:  Negative for chest pain.  ?Gastrointestinal:  Negative for abdominal pain and blood in stool.  ?Genitourinary:  Negative for dysuria.  ?Musculoskeletal:  Negative for falls and joint pain.  ?Skin:  Negative for rash.  ?Neurological:  Negative for headaches.  ?Psychiatric/Behavioral:  Negative for depression.   ?Past Medical History:  ?Diagnosis Date  ? Allergy   ? Alzheimer's dementia (HCC)   ? CT head 08/25/15 mild cortical atrophy and chronic small vessel ischemic change. atheroscloerosis. paranasal sinus disease   ? Anosmia   ? Asthma   ? Chicken pox   ? COPD (chronic obstructive pulmonary disease) (HCC)   ? Emphysema of lung (HCC)   ? per daughter never smoker exposed 2nd have via husband  ? Hard of hearing   ? since childhood  ? Hypertension   ? Nasal polyps   ? surgery x 15 years ago and in ~2013 as of 10/08/17  ? Shingles   ? left face 07/2021  ? Syncope   ? 03/2017 thought 2/2 diuretic stopped in [facility]   ? UTI (urinary tract infection)   ? ?Past Surgical History:  ?Procedure Laterality Date  ? ABDOMINAL HYSTERECTOMY    ? ? year ? reason ; partial   ? BREAST BIOPSY    ? ? year   ? nasal polyps removed    ? ?Family History  ?Problem Relation Age of Onset  ? Cancer Mother   ?     ? type  ? Other Mother   ?     scarlett fever, skin  ulcer, died of complications after MVA   ? Arthritis Mother   ? Alcohol abuse Paternal Uncle   ? Arthritis Father   ? ?Social History  ? ?Socioeconomic History  ? Marital status: Widowed  ?  Spouse name: Not on file  ? Number of children: Not on file  ? Years of education: Not on file  ? Highest education level: Not on file  ?Occupational History  ? Not on file  ?Tobacco Use  ? Smoking status: Former  ? Smokeless tobacco: Never  ?Substance and Sexual Activity  ? Alcohol use: No  ? Drug use: No  ? Sexual activity: Never  ?Other Topics Concern  ? Not on file  ?Social History Narrative  ? Lives with daughter [name] since 69 or 08/2017 just moved from [facility] (also in home grandson and son in law)  ? Widowed  ? 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 Never smoker   ? Masters degree  ? 3 kids   ? ?Social Determinants of Health  ? ?Financial Resource Strain: Not on file  ?Food Insecurity: Not on file  ?  Transportation Needs: Not on file  ?Physical Activity: Not on file  ?Stress: Not on file  ?Social Connections: Not on file  ?Intimate Partner Violence: Not on file  ? ?Current Meds  ?Medication Sig  ? amoxicillin-clavulanate (AUGMENTIN) 875-125 MG tablet Take 1 tablet by mouth 2 (two) times daily. With food  ? aspirin EC 81 MG tablet Take 81 mg by mouth daily.  ? Fluticasone-Umeclidin-Vilant (TRELEGY ELLIPTA) 100-62.5-25 MCG/INH AEPB Inhale 1 puff into the lungs daily. Rinse mouth after use  ? ipratropium-albuterol (DUONEB) 0.5-2.5 (3) MG/3ML SOLN Take 3 mLs by nebulization 3 (three) times daily as needed.  ? memantine (NAMENDA) 10 MG tablet TAKE 1 TABLET BY MOUTH TWICE A DAY  ? montelukast (SINGULAIR) 10 MG tablet Take 10 mg by mouth daily.  ? polyethylene glycol (MIRALAX / GLYCOLAX) 17 g packet Take 17 g by mouth daily.  ? [DISCONTINUED] donepezil (ARICEPT) 10 MG tablet Take 1 tablet (10 mg total) by mouth at bedtime.  ? [DISCONTINUED] losartan (COZAAR) 25 MG tablet Take 1 tablet (25 mg total) by  mouth daily.  ? [DISCONTINUED] predniSONE (DELTASONE) 20 MG tablet Take 2 tablets (40 mg total) by mouth daily with breakfast. X5-7 days in am  ? ?Allergies  ?Allergen Reactions  ? Latex   ?  rash  ? Skin Adhesives [Cyanoacrylate]   ? ?No results found for this or any previous visit (from the past 2160 hour(s)). ?Objective  ?Body mass index is 23.68 kg/m?. ?Wt Readings from Last 3 Encounters:  ?03/16/22 151 lb 3.2 oz (68.6 kg)  ?11/08/21 150 lb 14.2 oz (68.4 kg)  ?08/23/21 149 lb 12.8 oz (67.9 kg)  ? ?Temp Readings from Last 3 Encounters:  ?03/16/22 98.2 ?F (36.8 ?C) (Oral)  ?11/08/21 (!) 97.1 ?F (36.2 ?C) (Temporal)  ?08/23/21 98.2 ?F (36.8 ?C) (Oral)  ? ?BP Readings from Last 3 Encounters:  ?03/16/22 (!) 110/50  ?11/08/21 128/64  ?08/23/21 (!) 118/58  ? ?Pulse Readings from Last 3 Encounters:  ?03/16/22 76  ?11/08/21 66  ?08/03/21 70  ? ? ?Physical Exam ?Vitals and nursing note reviewed.  ?Constitutional:   ?   Appearance: Normal appearance. She is well-developed and well-groomed.  ?HENT:  ?   Head: Normocephalic and atraumatic.  ?Eyes:  ?   Conjunctiva/sclera: Conjunctivae normal.  ?   Pupils: Pupils are equal, round, and reactive to light.  ?Cardiovascular:  ?   Rate and Rhythm: Normal rate and regular rhythm.  ?   Heart sounds: Normal heart sounds. No murmur heard. ?Pulmonary:  ?   Effort: Pulmonary effort is normal.  ?   Breath sounds: Normal breath sounds.  ?Abdominal:  ?   General: Abdomen is flat. Bowel sounds are normal.  ?   Tenderness: There is no abdominal tenderness.  ?Musculoskeletal:     ?   General: No tenderness.  ?Skin: ?   General: Skin is warm and dry.  ?Neurological:  ?   General: No focal deficit present.  ?   Mental Status: She is alert and oriented to person, place, and time. Mental status is at baseline.  ?   Cranial Nerves: Cranial nerves 2-12 are intact.  ?   Sensory: Sensation is intact.  ?   Motor: Motor function is intact.  ?   Coordination: Coordination is intact.  ?   Gait: Gait is  intact.  ?   Comments: Bl walks with cane ?  ?Psychiatric:     ?   Attention and Perception: Attention and perception normal.     ?  Mood and Affect: Mood and affect normal.     ?   Speech: Speech normal.     ?   Behavior: Behavior normal. Behavior is cooperative.     ?   Thought Content: Thought content normal.     ?   Cognition and Memory: Cognition and memory normal.     ?   Judgment: Judgment normal.  ? ? ?Assessment  ?Plan  ?Acute cystitis without hematuria - Plan: Urinalysis, Routine w reflex microscopic, Urine Culture ? ?Essential hypertension with CKD 3- Plan: losartan (COZAAR) 25 MG tablet reduce to 12.5 mg qd monitor BP ?, Comprehensive metabolic panel, CBC with Differential/Platelet ? ?Chronic obstructive pulmonary disease, unspecified COPD type (HCC) - Plan: ipratropium-albuterol (DUONEB) 0.5-2.5 (3) MG/3ML SOLN ?trelegy ? ?Dementia with behavioral disturbance (HCC) - Plan: donepezil (ARICEPT) 10 MG tablet qd namenda 10 mg bid ?Consider palliative care and speech therapy in the future ? ?COPD exacerbation (HCC) - Plan: amoxicillin-clavulanate (AUGMENTIN) 875-125 MG tablet, predniSONE (DELTASONE) 40 mg qd x 5-7 days  ?Hold for now and use neb if not better fill above  ? ?HM ?Had flu utd ?prevnar 08/11/17.  ?Tdap utd ?[facility]  ?pna 23 had 07/15/18  ?Possibly had zostavx 09/2014  ?3/3 covid vxs had [facility]  ?  ?Dermatology seen in 2022  [facility] ?  ?Eye Dr. Fannie Knee appt seen and reported constricted pupils but vision ok-previous appt referred to ophthalmology appt sch 02/2021 AE ?  ?09/02/14 DEXA reviewed +osteopenia ?? If ever had colonoscopy, ? Last mammogram, s/p hysterectomy  Out of age window for all health maintenance  ? ?Rec healthy diet and exercise ? ?Provider: Dr. French Ana McLean-Scocuzza-Internal Medicine  ?

## 2022-03-16 NOTE — Patient Instructions (Signed)
Chronic Obstructive Pulmonary Disease ? ?Chronic obstructive pulmonary disease (COPD) is a long-term (chronic) condition that affects the lungs. COPD is a general term that can be used to describe many different lung problems that cause lung inflammation and limit airflow, including chronic bronchitis and emphysema. ?If you have COPD, your lung function will probably never return to normal. In most cases, it gets worse over time. However, there are steps you can take to slow the progression of the disease and improve your quality of life. ?What are the causes? ?This condition may be caused by: ?Smoking. This is the most common cause. ?Certain genes passed down through families. ?What increases the risk? ?The following factors may make you more likely to develop this condition: ?Being exposed to secondhand smoke from cigarettes, pipes, or cigars. ?Being exposed to chemicals and other irritants, such as fumes and dust in the work environment. ?Having chronic lung conditions or infections. ?What are the signs or symptoms? ?Symptoms of this condition include: ?Shortness of breath, especially during physical activity. ?Chronic cough with a large amount of thick mucus. Sometimes, the cough may not have any mucus (dry cough). ?Wheezing and rapid breathing. ?Gray or bluish discoloration (cyanosis) of the skin, especially in the fingers, toes, or lips. ?Feeling tired (fatigue). ?Weight loss. ?Chest tightness. ?Frequent infections. ?Episodes when breathing symptoms become much worse (exacerbations). ?At the later stages of this disease, you may have swelling in the ankles, feet, or legs. ?How is this diagnosed? ?This condition is diagnosed based on: ?Your medical history. ?A physical exam. ?You may also have tests, including: ?Lung (pulmonary) function tests. This may include a spirometry test, which measures your ability to exhale properly. ?Chest X-ray. ?CT scan. ?Blood tests. ?How is this treated? ?This condition may be  treated with: ?Medicines. These may include inhaled rescue medicines to treat acute exacerbations as well as medicines that you take long-term (maintenance medicines) to prevent flare-ups of COPD. ?Bronchodilators help treat COPD by dilating the airways to allow increased airflow and make your breathing more comfortable. ?Steroids can reduce airway inflammation and help prevent exacerbations. ?Smoking cessation. If you smoke, your health care provider may ask you to quit, and may also recommend therapy or replacement products to help you quit. ?Pulmonary rehabilitation. This may involve working with a team of health care providers and specialists, such as respiratory, occupational, and physical therapists. ?Exercise and physical activity. These are beneficial for nearly all people with COPD. ?Nutrition therapy to gain weight, if you are underweight. ?Oxygen. Supplemental oxygen therapy is only helpful if you have a low oxygen level in your blood (hypoxemia). ?Lung surgery or transplant. ?Palliative care. This is to help people with COPD feel comfortable when treatment is no longer working. ?Follow these instructions at home: ?Medicines ?Take over-the-counter and prescription medicines only as told by your health care provider. This includes inhaled medicines and pills. ?Talk to your health care provider before taking any cough or allergy medicines. You may need to avoid certain medicines that dry out your airways. ?Lifestyle ?If you smoke, the most important thing that you can do is to stop smoking. Continuing to smoke will cause the disease to progress faster. ?Do not use any products that contain nicotine or tobacco. These products include cigarettes, chewing tobacco, and vaping devices, such as e-cigarettes. If you need help quitting, ask your health care provider. ?Avoid exposure to things that irritate your lungs, such as smoke, chemicals, and fumes. ?Stay active, but balance activity with periods of rest.  Exercise and   physical activity will help you maintain your ability to do things you want to do. ?Learn and use relaxation techniques to manage stress and to control your breathing. ?Get the right amount of sleep and get quality sleep. Most adults need 7 or more hours per night. ?Eat healthy foods. Eating smaller, more frequent meals and resting before meals may help you maintain your strength. ?Controlled breathing ?Learn and use controlled breathing techniques as directed by your health care provider. Controlled breathing techniques include: ?Pursed lip breathing. Start by breathing in (inhaling) through your nose for 1 second. Then, purse your lips as if you were going to whistle and breathe out (exhale) through the pursed lips for 2 seconds. ?Diaphragmatic breathing. Start by putting one hand on your abdomen just above your waist. Inhale slowly through your nose. The hand on your abdomen should move out. Then purse your lips and exhale slowly. You should be able to feel the hand on your abdomen moving in as you exhale. ? ?Controlled coughing ?Learn and use controlled coughing to clear mucus from your lungs. Controlled coughing is a series of short, progressive coughs. The steps of controlled coughing are: ?Lean your head slightly forward. ?Breathe in deeply using diaphragmatic breathing. ?Try to hold your breath for 3 seconds. ?Keep your mouth slightly open while coughing twice. ?Spit any mucus out into a tissue. ?Rest and repeat the steps once or twice as needed. ?General instructions ?Make sure you receive all the vaccines that your health care provider recommends, especially the pneumococcal and influenza vaccines. Preventing infection and hospitalization is very important when you have COPD. ?Drink enough fluid to keep your urine pale yellow, unless you have a medical condition that requires fluid restriction. ?Use oxygen therapy and pulmonary rehabilitation if told by your health care provider. If you  require home oxygen therapy, ask your health care provider whether you should purchase a pulse oximeter to measure your oxygen level at home. ?Work with your health care provider to develop a COPD action plan. This will help you know what steps to take if your condition gets worse. ?Keep other chronic health conditions under control as told by your health care provider. ?Avoid extreme temperature and humidity changes. ?Avoid contact with people who have an illness that spreads from person to person (is contagious), such as viral infections or pneumonia. ?Keep all follow-up visits. This is important. ?Contact a health care provider if: ?You are coughing up more mucus than usual. ?There is a change in the color or thickness of your mucus. ?Your breathing is more labored than usual. ?Your breathing is faster than usual. ?You have difficulty sleeping. ?You need to use your rescue medicines or inhalers more often than expected. ?You have trouble doing routine activities such as getting dressed or walking around the house. ?Get help right away if: ?You have shortness of breath while you are resting. ?You have shortness of breath that prevents you from: ?Being able to talk. ?Performing your usual physical activities. ?You have chest pain lasting longer than 5 minutes. ?Your skin color is more blue (cyanotic) than usual. ?You measure low oxygen saturations for longer than 5 minutes with a pulse oximeter. ?You have a fever. ?You feel too tired to breathe normally. ?These symptoms may represent a serious problem that is an emergency. Do not wait to see if the symptoms will go away. Get medical help right away. Call your local emergency services (911 in the [place] Do not drive yourself to the hospital. ?Summary ?Chronic obstructive   pulmonary disease (COPD) is a long-term (chronic) condition that affects the lungs. ?Your lung function will probably never return to normal. In most cases, it gets worse over time. However, there  are steps you can take to slow the progression of the disease and improve your quality of life. ?Treatment for COPD may include taking medicines, quitting smoking, pulmonary rehabilitation, and changes to diet and

## 2022-03-17 LAB — CBC WITH DIFFERENTIAL/PLATELET
Basophils Absolute: 0.1 10*3/uL (ref 0.0–0.1)
Basophils Relative: 1.1 % (ref 0.0–3.0)
Eosinophils Absolute: 0.7 10*3/uL (ref 0.0–0.7)
Eosinophils Relative: 7.4 % — ABNORMAL HIGH (ref 0.0–5.0)
HCT: 40.3 % (ref 36.0–46.0)
Hemoglobin: 13.2 g/dL (ref 12.0–15.0)
Lymphocytes Relative: 19.6 % (ref 12.0–46.0)
Lymphs Abs: 1.8 10*3/uL (ref 0.7–4.0)
MCHC: 32.7 g/dL (ref 30.0–36.0)
MCV: 96.2 fl (ref 78.0–100.0)
Monocytes Absolute: 0.7 10*3/uL (ref 0.1–1.0)
Monocytes Relative: 7.9 % (ref 3.0–12.0)
Neutro Abs: 6 10*3/uL (ref 1.4–7.7)
Neutrophils Relative %: 64 % (ref 43.0–77.0)
Platelets: 210 10*3/uL (ref 150.0–400.0)
RBC: 4.19 Mil/uL (ref 3.87–5.11)
RDW: 15.9 % — ABNORMAL HIGH (ref 11.5–15.5)
WBC: 9.3 10*3/uL (ref 4.0–10.5)

## 2022-03-17 LAB — COMPREHENSIVE METABOLIC PANEL
ALT: 14 U/L (ref 0–35)
AST: 16 U/L (ref 0–37)
Albumin: 3.8 g/dL (ref 3.5–5.2)
Alkaline Phosphatase: 67 U/L (ref 39–117)
BUN: 26 mg/dL — ABNORMAL HIGH (ref 6–23)
CO2: 28 mEq/L (ref 19–32)
Calcium: 9.4 mg/dL (ref 8.4–10.5)
Chloride: 104 mEq/L (ref 96–112)
Creatinine, Ser: 1.26 mg/dL — ABNORMAL HIGH (ref 0.40–1.20)
GFR: 35.89 mL/min — ABNORMAL LOW (ref >60.00)
Glucose, Bld: 98 mg/dL (ref 70–99)
Potassium: 4.4 mEq/L (ref 3.5–5.1)
Sodium: 141 mEq/L (ref 135–145)
Total Bilirubin: 0.4 mg/dL (ref 0.2–1.2)
Total Protein: 6.4 g/dL (ref 6.0–8.3)

## 2022-03-17 LAB — TSH: TSH: 3.53 u[IU]/mL (ref 0.35–5.50)

## 2022-03-17 LAB — VITAMIN D 25 HYDROXY (VIT D DEFICIENCY, FRACTURES): VITD: 29.41 ng/mL — ABNORMAL LOW (ref 30.00–100.00)

## 2022-03-17 LAB — HEMOGLOBIN A1C: Hgb A1c MFr Bld: 5.8 % (ref 4.6–6.5)

## 2022-03-18 LAB — URINALYSIS, ROUTINE W REFLEX MICROSCOPIC
Bilirubin Urine: NEGATIVE
Glucose, UA: NEGATIVE
Hgb urine dipstick: NEGATIVE
Hyaline Cast: NONE SEEN /LPF
Ketones, ur: NEGATIVE
Leukocytes,Ua: NEGATIVE
Nitrite: POSITIVE — AB
Protein, ur: NEGATIVE
RBC / HPF: NONE SEEN /HPF (ref 0–2)
Specific Gravity, Urine: 1.024 (ref 1.001–1.035)
pH: <=5 (ref 5.0–8.0)

## 2022-03-18 LAB — URINE CULTURE
MICRO NUMBER:: 13383262
SPECIMEN QUALITY:: ADEQUATE

## 2022-03-18 LAB — MICROSCOPIC MESSAGE

## 2022-03-20 ENCOUNTER — Telehealth: Payer: Self-pay

## 2022-03-20 NOTE — Telephone Encounter (Signed)
Lvm for pt's daughter Stanton Kidney to return call in regards to lab results. ? ?Per Dr.Tracy: ?Kidney function overall stable (CKD stage 3) but creatinine slightly elevated  ?Make sure drinking water 55-64 ounces daily  ?Blood cts normal elevated eosinophils which indicate allergies  ?Vitamin D low rec De 2000 to 4000 IU daily over the counter  ?+prediabetes  ?Thyroid lab normal  ?Urine culture + uti please fill prescription for augmentin and take this antibiotic please ?

## 2022-04-09 ENCOUNTER — Encounter: Payer: Self-pay | Admitting: Internal Medicine

## 2022-04-16 ENCOUNTER — Telehealth: Payer: Self-pay | Admitting: Internal Medicine

## 2022-04-16 DIAGNOSIS — R159 Full incontinence of feces: Secondary | ICD-10-CM | POA: Insufficient documentation

## 2022-04-16 MED ORDER — DEPEND ADJUSTABLE UNDERWEAR LG MISC: 1.0000 | Freq: Four times a day (QID) | 3 refills | Status: DC

## 2022-04-16 NOTE — Telephone Encounter (Signed)
Please grab Rx for Depends off printer and place in my box to sign please and call pt when signed  I will come through the office tomorrow 04/17/22 with paperwork and to sign this   Thank you

## 2022-04-16 NOTE — Addendum Note (Signed)
Addended by: Orland Mustard on: 04/16/2022 11:33 PM   Modules accepted: Orders

## 2022-05-25 ENCOUNTER — Other Ambulatory Visit: Payer: Self-pay | Admitting: Internal Medicine

## 2022-05-25 DIAGNOSIS — I10 Essential (primary) hypertension: Secondary | ICD-10-CM

## 2022-07-18 ENCOUNTER — Encounter: Payer: Self-pay | Admitting: Internal Medicine

## 2022-07-18 ENCOUNTER — Ambulatory Visit: Payer: Medicare PPO | Admitting: Internal Medicine

## 2022-07-18 VITALS — BP 110/62 | HR 82 | Temp 98.2°F | Ht 67.0 in | Wt 144.0 lb

## 2022-07-18 DIAGNOSIS — N183 Chronic kidney disease, stage 3 unspecified: Secondary | ICD-10-CM | POA: Diagnosis not present

## 2022-07-18 DIAGNOSIS — R634 Abnormal weight loss: Secondary | ICD-10-CM

## 2022-07-18 DIAGNOSIS — R7303 Prediabetes: Secondary | ICD-10-CM

## 2022-07-18 DIAGNOSIS — J449 Chronic obstructive pulmonary disease, unspecified: Secondary | ICD-10-CM

## 2022-07-18 DIAGNOSIS — N390 Urinary tract infection, site not specified: Secondary | ICD-10-CM | POA: Diagnosis not present

## 2022-07-18 DIAGNOSIS — F03918 Unspecified dementia, unspecified severity, with other behavioral disturbance: Secondary | ICD-10-CM | POA: Diagnosis not present

## 2022-07-18 DIAGNOSIS — E559 Vitamin D deficiency, unspecified: Secondary | ICD-10-CM | POA: Diagnosis not present

## 2022-07-18 NOTE — Progress Notes (Signed)
Chief Complaint  Patient presents with   Follow-up    4 MONTH F/U.  Pts daughter states her health has been declining since last being here. She states that pt is sleeping a lot more. Pt has less of an appetite and pt is more irritable.    F/u with daughter [name]  1. H/o CKD3a/b,COPD (will not use inhalers/neb) dementia eating less with 7 lb wt loss, increased sleep and irritable daughter declines imaging but wants labs done and urine culture with recurrent UTI     Review of Systems  Constitutional:  Positive for malaise/fatigue and weight loss.  HENT:  Negative for hearing loss.   Eyes:  Negative for blurred vision.  Respiratory:  Negative for shortness of breath.   Cardiovascular:  Negative for chest pain.  Gastrointestinal:  Negative for abdominal pain and blood in stool.  Genitourinary:  Negative for dysuria.  Musculoskeletal:  Negative for falls and joint pain.  Skin:  Negative for rash.  Neurological:  Negative for headaches.  Psychiatric/Behavioral:  Negative for depression.    Past Medical History:  Diagnosis Date   Allergy    Alzheimer's dementia (HCC)    CT head 08/25/15 mild cortical atrophy and chronic small vessel ischemic change. atheroscloerosis. paranasal sinus disease    Anosmia    Asthma    Chicken pox    COPD (chronic obstructive pulmonary disease) (HCC)    Emphysema of lung (HCC)    per daughter never smoker exposed 2nd have via husband   Hard of hearing    since childhood   Hypertension    Nasal polyps    surgery x 15 years ago and in ~2013 as of 10/08/17   Shingles    left face 07/2021   Syncope    03/2017 thought 2/2 diuretic stopped in [facility]    UTI (urinary tract infection)    Past Surgical History:  Procedure Laterality Date   ABDOMINAL HYSTERECTOMY     ? year ? reason ; partial    BREAST BIOPSY     ? year    nasal polyps removed     Family History  Problem Relation Age of Onset   Cancer Mother        ? type   Other Mother         scarlett fever, skin ulcer, died of complications after MVA    Arthritis Mother    Alcohol abuse Paternal Uncle    Arthritis Father    Social History   Socioeconomic History   Marital status: Widowed    Spouse name: Not on file   Number of children: Not on file   Years of education: Not on file   Highest education level: Not on file  Occupational History   Not on file  Tobacco Use   Smoking status: Former   Smokeless tobacco: Never  Substance and Sexual Activity   Alcohol use: No   Drug use: No   Sexual activity: Never  Other Topics Concern   Not on file  Social History Narrative   Lives with daughter [name] since 89 or 08/2017 just moved from [facility] (also in home grandson and son in [place])   Widowed   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   donepezil (ARICEPT) 10 MG tablet Take 1 tablet (10 mg total) by mouth at bedtime.   Incontinence Supply Disposable (DEPEND ADJUSTABLE UNDERWEAR LG) MISC 1 each by Does not apply route in the morning, at noon, in the evening, and at bedtime. 1 year supply change 4x per day size LARGE   losartan (COZAAR) 25 MG tablet TAKE 1 TABLET (25 MG TOTAL) BY MOUTH DAILY.   memantine (NAMENDA) 10 MG tablet TAKE 1 TABLET BY MOUTH TWICE A DAY   montelukast (SINGULAIR) 10 MG tablet Take 10 mg by mouth daily.   Allergies  Allergen Reactions   Latex     rash   Skin Adhesives [Cyanoacrylate]    No results found for this or any previous visit (from the past 2160 hour(s)). Objective  Body mass index is 22.55 kg/m. Wt Readings from Last 3  Encounters:  07/18/22 144 lb (65.3 kg)  03/16/22 151 lb 3.2 oz (68.6 kg)  11/08/21 150 lb 14.2 oz (68.4 kg)   Temp Readings from Last 3 Encounters:  07/18/22 98.2 F (36.8 C) (Oral)  03/16/22 98.2 F (36.8 C) (Oral)  11/08/21 (!) 97.1 F (36.2 C) (Temporal)   BP Readings from Last 3 Encounters:  07/18/22 110/62  03/16/22 (!) 110/50  11/08/21 128/64   Pulse Readings from Last 3 Encounters:  07/18/22 82  03/16/22 76  11/08/21 66    Physical Exam Vitals and nursing note reviewed.  Constitutional:      Appearance: Normal appearance. She is well-developed and well-groomed.  HENT:     Head: Normocephalic and atraumatic.  Eyes:     Conjunctiva/sclera: Conjunctivae normal.     Pupils: Pupils are equal, round, and reactive to light.  Cardiovascular:     Rate and Rhythm: Normal rate and regular rhythm.     Heart sounds: Normal heart sounds. No murmur heard. Pulmonary:     Effort: Pulmonary effort is normal.     Breath sounds: Normal breath sounds.  Abdominal:     General: Abdomen is flat. Bowel sounds are normal.     Tenderness: There is no abdominal tenderness.  Musculoskeletal:        General: No tenderness.  Skin:    General: Skin is warm and dry.  Neurological:     General: No focal deficit present.     Mental Status: She is alert and oriented to person, place, and time. Mental status is at baseline.     Cranial Nerves: Cranial nerves 2-12 are intact.     Motor: Motor function is intact.     Coordination: Coordination is intact.     Gait: Gait is intact.  Psychiatric:        Attention and Perception: Attention and perception normal.        Mood and Affect: Mood and affect normal.        Speech: Speech normal.        Behavior: Behavior normal. Behavior is cooperative.        Thought Content: Thought content normal.        Cognition and Memory: Cognition and memory normal.        Judgment: Judgment normal.     Assessment  Plan  Weight loss -  Plan: Amb  Referral to Palliative Care Daughter declines imaging  Dementia with behavioral disturbance ([place]) - Plan: Amb Referral to Palliative Care  Chronic obstructive pulmonary disease, unspecified COPD type ([place]) - Plan: Amb Referral to Palliative Care  Recurrent UTI - Plan: Urine Culture, Amb Referral to Palliative Care  Stage 3 chronic kidney disease, unspecified whether stage 3a or 3b CKD ([place]) - Plan: Comprehensive metabolic panel, CBC w/Diff, Amb Referral to Palliative Care  Vitamin D deficiency - Plan: Vitamin D (25 hydroxy)  Prediabetes - Plan: Hemoglobin A1c   HM Had flu utd prevnar 08/11/17.  Tdap utd shingrix 1/2 check with [facility]  pna 25 had 07/15/18  Possibly had zostavx 09/2014  3/3 covid vxs had [place]    Dermatology seen in 2022  [place]   Eye Dr. Jacinto Reap [place] appt seen and reported constricted pupils but vision ok-previous appt referred to ophthalmology appt sch 02/2021 [facility]   09/02/14 DEXA reviewed +osteopenia ? If ever had colonoscopy, ? Last mammogram, s/p hysterectomy  Out of age window for all health maintenance    Rec healthy diet and exercise Provider: Dr. Olivia Mackie McLean-Scocuzza-Internal Medicine

## 2022-07-19 ENCOUNTER — Telehealth: Payer: Self-pay

## 2022-07-19 LAB — COMPREHENSIVE METABOLIC PANEL
ALT: 10 U/L (ref 0–35)
AST: 13 U/L (ref 0–37)
Albumin: 3.7 g/dL (ref 3.5–5.2)
Alkaline Phosphatase: 74 U/L (ref 39–117)
BUN: 21 mg/dL (ref 6–23)
CO2: 27 mEq/L (ref 19–32)
Calcium: 9.4 mg/dL (ref 8.4–10.5)
Chloride: 103 mEq/L (ref 96–112)
Creatinine, Ser: 1.21 mg/dL — ABNORMAL HIGH (ref 0.40–1.20)
GFR: 37.59 mL/min — ABNORMAL LOW (ref >60.00)
Glucose, Bld: 74 mg/dL (ref 70–99)
Potassium: 4.3 mEq/L (ref 3.5–5.1)
Sodium: 141 mEq/L (ref 135–145)
Total Bilirubin: 0.5 mg/dL (ref 0.2–1.2)
Total Protein: 6.2 g/dL (ref 6.0–8.3)

## 2022-07-19 LAB — CBC WITH DIFFERENTIAL/PLATELET
Basophils Absolute: 0.1 10*3/uL (ref 0.0–0.1)
Basophils Relative: 0.9 % (ref 0.0–3.0)
Eosinophils Absolute: 0.8 10*3/uL — ABNORMAL HIGH (ref 0.0–0.7)
Eosinophils Relative: 8.8 % — ABNORMAL HIGH (ref 0.0–5.0)
HCT: 44.6 % (ref 36.0–46.0)
Hemoglobin: 14.4 g/dL (ref 12.0–15.0)
Lymphocytes Relative: 20.9 % (ref 12.0–46.0)
Lymphs Abs: 1.8 10*3/uL (ref 0.7–4.0)
MCHC: 32.3 g/dL (ref 30.0–36.0)
MCV: 95.9 fl (ref 78.0–100.0)
Monocytes Absolute: 0.7 10*3/uL (ref 0.1–1.0)
Monocytes Relative: 7.7 % (ref 3.0–12.0)
Neutro Abs: 5.3 10*3/uL (ref 1.4–7.7)
Neutrophils Relative %: 61.7 % (ref 43.0–77.0)
Platelets: 206 10*3/uL (ref 150.0–400.0)
RBC: 4.65 Mil/uL (ref 3.87–5.11)
RDW: 14.5 % (ref 11.5–15.5)
WBC: 8.7 10*3/uL (ref 4.0–10.5)

## 2022-07-19 LAB — HEMOGLOBIN A1C: Hgb A1c MFr Bld: 6 % (ref 4.6–6.5)

## 2022-07-19 LAB — VITAMIN D 25 HYDROXY (VIT D DEFICIENCY, FRACTURES): VITD: 40.84 ng/mL (ref 30.00–100.00)

## 2022-07-19 NOTE — Telephone Encounter (Signed)
Spoke with patient's daughter [name] and scheduled a Mychart Palliative Consult for 07/27/22 @ 4PM.  Consent obtained; updated Netsmart, Team List and Epic.

## 2022-07-19 NOTE — Telephone Encounter (Signed)
Attempted to contact patient's daughter [name] to schedule a Palliative Care consult appointment. No answer left a message to return call.

## 2022-07-20 ENCOUNTER — Encounter: Payer: Self-pay | Admitting: Internal Medicine

## 2022-07-21 ENCOUNTER — Other Ambulatory Visit: Payer: Self-pay | Admitting: Internal Medicine

## 2022-07-21 DIAGNOSIS — J441 Chronic obstructive pulmonary disease with (acute) exacerbation: Secondary | ICD-10-CM

## 2022-07-21 DIAGNOSIS — N3000 Acute cystitis without hematuria: Secondary | ICD-10-CM

## 2022-07-21 LAB — URINE CULTURE
MICRO NUMBER:: 13905615
SPECIMEN QUALITY:: ADEQUATE

## 2022-07-21 MED ORDER — AMOXICILLIN-POT CLAVULANATE 875-125 MG PO TABS: 1.0000 | ORAL_TABLET | Freq: Two times a day (BID) | ORAL | 0 refills | Status: DC

## 2022-07-24 ENCOUNTER — Telehealth: Payer: Self-pay

## 2022-07-24 NOTE — Telephone Encounter (Signed)
-----   Message from Delorise Jackson, MD sent at 07/21/2022  4:44 PM EDT ----- + UTI tx augmentin 2x per day with food x 1 week

## 2022-07-24 NOTE — Telephone Encounter (Signed)
LMTCB for lab results:  + UTI tx augmentin 2x per day with food x 1 week

## 2022-07-26 ENCOUNTER — Telehealth: Payer: Self-pay

## 2022-07-26 NOTE — Telephone Encounter (Signed)
noted 

## 2022-07-26 NOTE — Telephone Encounter (Signed)
Spoke with Ms. [name] in regards to pts urine culture results.   Ms. Stanton Kidney told me that she has been sick since last Friday and she has been wearing a mask and trying to stay isolated from the pt.   Ms. Stanton Kidney wanted to inform me that she thinks its flu and did not want me to be surprised if she called to get the  pt checked. She stated she is keeping an eye on her mother to see if she gets any sx's.  I advised the pt to get checked by her pcp to see if she has been exposed to covid or the flu and if she was positive she can get her mother tested as well here.

## 2022-07-27 ENCOUNTER — Telehealth: Payer: Medicare PPO | Admitting: Nurse Practitioner

## 2022-07-27 ENCOUNTER — Encounter: Payer: Self-pay | Admitting: Nurse Practitioner

## 2022-07-27 DIAGNOSIS — R0602 Shortness of breath: Secondary | ICD-10-CM

## 2022-07-27 DIAGNOSIS — J449 Chronic obstructive pulmonary disease, unspecified: Secondary | ICD-10-CM

## 2022-07-27 DIAGNOSIS — Z515 Encounter for palliative care: Secondary | ICD-10-CM

## 2022-07-27 DIAGNOSIS — R634 Abnormal weight loss: Secondary | ICD-10-CM

## 2022-07-27 DIAGNOSIS — F02818 Dementia in other diseases classified elsewhere, unspecified severity, with other behavioral disturbance: Secondary | ICD-10-CM

## 2022-07-27 NOTE — Addendum Note (Signed)
Addended by: Orland Mustard on: 07/27/2022 04:49 PM   Modules accepted: Orders

## 2022-07-27 NOTE — Progress Notes (Signed)
Therap[facility], [facility] Consult Note Telephone: [phone]  Fax: [phone]   Date of encounter: 07/27/22 7:33 PM PATIENT NAME: Makayla [name] [address] [place] [id number]   [phone] (home)  DOB: 10/31/1923 MRN: [id number] PRIMARY CARE PROVIDER:    McLean-Scocuzza, Pasty Spillersracy N, MD,  [address] [place] [address] [phone]  REFERRING PROVIDER:   McLean-Scocuzza, Pasty Spillersracy N, MD [address] [place],  [place] [address] [phone]  RESPONSIBLE PARTY:    Contact Information     Name Relation Home Work Mobile   Ward,[name] Daughter [phone]       Palliative Care was asked to follow this patient by consultation request of  McLean-Scocuzza, French Anaracy * to address advance care planning and complex medical decision making. This is the in Due to the COVID-19 crisis, this visit was done via telemedicine from my office and it was initiated and consent by this patient and or family. I connected with Daughter, [name] with  [facility] on 07/27/22 by a video enabled telemedicine application and verified that I am speaking with the correct person. I discussed the limitations of evaluation and management by telemedicine. The patient expressed understanding and agreed to proceed. visit.         Why Hospice:   Functional decline 1 year ago was able to do ambulatory, supervision with ADL's Currently rocks multiple times to be able to get out of a chair, or requires assistance, unsteady gait  Sleeping >18 hours a day; increase in agitation, possible visual hallucinations per daughter. Speech is slower and more difficult to understand.   Coughing with eating; continues to feed herself long time to eat. Weight loss; 3 years ago weight 165 lbs 05/06/2021 weight 157.3 lbs 07/18/2022 weight 144 lbs 21 lbs loss/3 years; 12.73 % 13.3 lbs loss/1 year; 8.46 %       Recurrent UTI's.     CKD with worsening renal function  does not wish for dialysis; 3b; GFR  39/bun 21; creatinine 1.21          Worsening COPD, shortness of breath; unable to use nebs or inhalers;   Symptom Management/Plan: 1. Advance Care Planning; DNR; golden rod on refrigerator; focus comfort care at home, wishes are for hospice evaluation to see for eligibly.    2. Goals of Care: Goals include to maximize quality of life and symptom management. Our advance care planning conversation included a discussion about:    The value and importance of advance care planning  Exploration of personal, cultural or spiritual beliefs that might influence medical decisions  Exploration of goals of care in the event of a sudden injury or illness  Identification and preparation of a healthcare agent  Review and updating or creation of an advance directive document.  3. Weight loss secondary to Dementia; progressive, discussed nutrition; supplements; appears dysphagia, coughing with eating  3 years ago weight 165 lbs 05/06/2021 weight 157.3 lbs 07/18/2022 weight 144 lbs 21 lbs loss/3 years; 12.73 % 13.3 lbs loss/1 year; 8.46 %  4. Dementia progressive with ongoing decline; recurrent UTI's, discussed progression with realistic expectations; talked about hospice under medicare benefit, what services are provided. [name] wishes for hospice evaluation  5. Shortness of breath worsening with ongoing sob, secondary to COPD, discussed is not able to use inhalers, nebs. Continue with monitor respiratory status, O2 for comfort if needed, supportive care with symptoms including aspiration precautions.   6. Palliative care encounter; Palliative care encounter; Palliative medicine team  will continue to support patient, patient's family, and medical team. Visit consisted of counseling and education dealing with the complex and emotionally intense issues of symptom management and palliative care in the setting of serious and potentially life-threatening illness  ASSESSMENT AND  PLAN / RECOMMENDATIONS:  Advance Care Planning/Goals of Care: Goals include to maximize quality of life and symptom management. Patient/health care surrogate gave his/her permission to discuss.Our advance care planning conversation included a discussion about:    The value and importance of advance care planning  Experiences with loved ones who have been seriously ill or have died  Exploration of personal, cultural or spiritual beliefs that might influence medical decisions  Exploration of goals of care in the event of a sudden injury or illness  Identification  of a healthcare agent  Review and updating or creation of an  advance directive document . Decision not to resuscitate or to de-escalate disease focused treatments due to poor prognosis. CODE STATUS: DNR  I spent 61 minutes providing this consultation. More than 50% of the time in this consultation was spent in counseling and care coordination. PPS: 40% (difficulty getting out of a chair, multiple rocking motion or assistance)  Chief Complaint: Initial palliative consult for complex medical decision making, address goals, manage ongoing symptoms  HISTORY OF PRESENT ILLNESS:  Makayla [name] is a 88 y.o. year old female  with multiple medical problems including Alzheimer's dementia, asthma, COPD, emphysema, HOH, HTN, recurrent UTI's. I connected with Stanton Kidney, Ms. Traywick's daughter for initial pc visit. We talked about the last time Ms. Millikan was independent about 5 years ago, she required to move in with daughters due to increase in cognitive and functional decline. We talked about past medical history, functional and cognitive abilities with decline over time, ros, symptoms including sob, agitation. We talked about medical goc, code status is a DNR, we talked about option of hospice under medicare benefit. We talked about role pc in poc. Discussed family, social dynamics. We talked about overall decline, debility and progression. Therapeutic  listening, emotional support provided. Questions answered. Contact information provided. Notified primary for order for hospice as Stanton Kidney wishes to proceed with hospice evaluation.   History obtained from review of EMR, discussion with primary team, and interview with family, facility staff/caregiver and/or Ms. Sharyon Cable.  I reviewed available labs, medications, imaging, studies and related documents from the EMR.  Records reviewed and summarized above.   ROS 10 point system reviewed all negative except +weight loss, +unsteady gait, +fatigue, +generalized weakness  Physical Exam: deferred CURRENT PROBLEM LIST:  Patient Active Problem List   Diagnosis Date Noted   Incontinence of feces 04/16/2022   DNR (do not resuscitate) 08/23/2021   Exudative age-related macular degeneration of left eye ([place]) 05/06/2021   Nonexudative age-related macular degeneration of right eye 05/06/2021   History of nasal polyp 01/13/2021   Sinusitis 01/13/2021   Allergic rhinitis 01/13/2021   Recurrent falls 09/21/2020   Fatigue 09/21/2020   Prediabetes 09/21/2020   Vitamin D deficiency 09/21/2020   Reduced vision 09/21/2020   Bilateral hearing loss 09/21/2020   Urinary incontinence 09/21/2020   Stage 3b chronic kidney disease ([place]) 09/21/2020   H/O valvular heart disease 09/21/2020   Arthritis, lumbar spine 02/25/2020   Chronic constipation 01/26/2020   Seborrheic keratoses 07/25/2019   Nail dystrophy 04/21/2019   Depression 04/17/2019   AK (actinic keratosis) 05/13/2018   History of prediabetes 01/07/2018   Abnormal gait 01/07/2018   CKD (chronic kidney disease) stage 3, GFR 30-59  ml/min ([place]) 11/25/2017   COPD (chronic obstructive pulmonary disease) ([place]) 11/25/2017   HLD (hyperlipidemia) 11/25/2017   Arthritis 11/25/2017   Carotid artery stenosis 123XX123   Diastolic dysfunction 123XX123   Aortic valve sclerosis 11/25/2017   Essential hypertension 10/08/2017   Dementia with behavioral  disturbance ([place]) 10/08/2017   Heart murmur 10/08/2017   PAST MEDICAL HISTORY:  Active Ambulatory Problems    Diagnosis Date Noted   Essential hypertension 10/08/2017   Dementia with behavioral disturbance ([place]) 10/08/2017   Heart murmur 10/08/2017   CKD (chronic kidney disease) stage 3, GFR 30-59 ml/min ([facility]) 11/25/2017   COPD (chronic obstructive pulmonary disease) ([place]) 11/25/2017   HLD (hyperlipidemia) 11/25/2017   Arthritis 11/25/2017   Carotid artery stenosis 123XX123   Diastolic dysfunction 123XX123   Aortic valve sclerosis 11/25/2017   History of prediabetes 01/07/2018   Abnormal gait 01/07/2018   AK (actinic keratosis) 05/13/2018   Depression 04/17/2019   Nail dystrophy 04/21/2019   Seborrheic keratoses 07/25/2019   Chronic constipation 01/26/2020   Arthritis, lumbar spine 02/25/2020   Recurrent falls 09/21/2020   Fatigue 09/21/2020   Prediabetes 09/21/2020   Vitamin D deficiency 09/21/2020   Reduced vision 09/21/2020   Bilateral hearing loss 09/21/2020   Urinary incontinence 09/21/2020   Stage 3b chronic kidney disease ([place]) 09/21/2020   H/O valvular heart disease 09/21/2020   History of nasal polyp 01/13/2021   Sinusitis 01/13/2021   Allergic rhinitis 01/13/2021   Exudative age-related macular degeneration of left eye ([place]) 05/06/2021   Nonexudative age-related macular degeneration of right eye 05/06/2021   DNR (do not resuscitate) 08/23/2021   Incontinence of feces 04/16/2022   Resolved Ambulatory Problems    Diagnosis Date Noted   Thrombophlebitis leg 07/19/2016   Mild intermittent asthma without complication [id number]   COPD with acute exacerbation ([place]) 09/05/2018   COPD exacerbation ([place]) 09/13/2018   Cellulitis 08/01/2021   Facial cellulitis 08/02/2021   Past Medical History:  Diagnosis Date   Allergy    Alzheimer's dementia ([place])    Anosmia    Asthma    Chicken pox    Emphysema of lung ([place])    Hard of hearing    Hypertension    Nasal  polyps    Shingles    Syncope    UTI (urinary tract infection)    SOCIAL HX:  Social History   Tobacco Use   Smoking status: Former   Smokeless tobacco: Never  Substance Use Topics   Alcohol use: No   FAMILY HX:  Family History  Problem Relation Age of Onset   Cancer Mother        ? type   Other Mother        scarlett fever, skin ulcer, died of complications after MVA    Arthritis Mother    Alcohol abuse Paternal Uncle    Arthritis Father       ALLERGIES:  Allergies  Allergen Reactions   Latex     rash   Skin Adhesives [Cyanoacrylate]      PERTINENT MEDICATIONS:  Outpatient Encounter Medications as of 07/27/2022  Medication Sig   acetaminophen (TYLENOL) 325 MG tablet Take 2 tablets (650 mg total) by mouth every 6 (six) hours as needed for mild pain. (Patient not taking: Reported on 11/08/2021)   amoxicillin-clavulanate (AUGMENTIN) 875-125 MG tablet Take 1 tablet by mouth 2 (two) times daily. With food   aspirin EC 81 MG tablet Take 81 mg by mouth daily.   Azelastine-Fluticasone 137-50 MCG/ACT SUSP PLACE  1 SPRAY INTO THE NOSE 2 (TWO) TIMES DAILY AS NEEDED. (Patient not taking: Reported on 03/16/2022)   donepezil (ARICEPT) 10 MG tablet Take 1 tablet (10 mg total) by mouth at bedtime.   Fluticasone-Umeclidin-Vilant (TRELEGY ELLIPTA) 100-62.5-25 MCG/ACT AEPB Inhale 1 puff into the lungs daily. Rinse mouth (Patient not taking: Reported on 07/18/2022)   Incontinence Supply Disposable (DEPEND ADJUSTABLE UNDERWEAR LG) MISC 1 each by Does not apply route in the morning, at noon, in the evening, and at bedtime. 1 year supply change 4x per day size LARGE   ipratropium-albuterol (DUONEB) 0.5-2.5 (3) MG/3ML SOLN Take 3 mLs by nebulization 3 (three) times daily as needed. (Patient not taking: Reported on 07/18/2022)   losartan (COZAAR) 25 MG tablet TAKE 1 TABLET (25 MG TOTAL) BY MOUTH DAILY.   memantine (NAMENDA) 10 MG tablet TAKE 1 TABLET BY MOUTH TWICE A DAY   montelukast (SINGULAIR) 10  MG tablet Take 10 mg by mouth daily.   polyethylene glycol (MIRALAX / GLYCOLAX) 17 g packet Take 17 g by mouth daily. (Patient not taking: Reported on 07/18/2022)   predniSONE (DELTASONE) 20 MG tablet Take 2 tablets (40 mg total) by mouth daily with breakfast. X5-7 days in am (Patient not taking: Reported on 07/18/2022)   No facility-administered encounter medications on file as of 07/27/2022.   Thank you for the opportunity to participate in the care of Ms. Sharyon Cable.  The palliative care team will continue to follow. Please call our office at [phone] if we can be of additional assistance.   Bessye Stith Z Taevin Mcferran, NP ,

## 2022-08-30 LAB — HSV DNA BY PCR (REFERENCE LAB)
HSV 1 DNA: NEGATIVE
HSV 2 DNA: NEGATIVE

## 2022-12-18 ENCOUNTER — Ambulatory Visit: Payer: Medicare PPO | Admitting: Family Medicine

## 2023-05-07 DEATH — deceased

## 2024-09-23 NOTE — Telephone Encounter (Signed)
 open in error
